# Patient Record
Sex: Female | Born: 1995 | Race: White | Hispanic: No | Marital: Single | Smoking: Former smoker
Health system: Southern US, Community
[De-identification: ages and names within clinical notes are randomized; demographics above are authoritative.]

## PROBLEM LIST (undated history)

## (undated) DIAGNOSIS — I421 Obstructive hypertrophic cardiomyopathy: Secondary | ICD-10-CM

## (undated) DIAGNOSIS — I219 Acute myocardial infarction, unspecified: Secondary | ICD-10-CM

## (undated) DIAGNOSIS — R6250 Unspecified lack of expected normal physiological development in childhood: Secondary | ICD-10-CM

## (undated) DIAGNOSIS — I4901 Ventricular fibrillation: Secondary | ICD-10-CM

## (undated) DIAGNOSIS — Z95810 Presence of automatic (implantable) cardiac defibrillator: Secondary | ICD-10-CM

## (undated) DIAGNOSIS — F909 Attention-deficit hyperactivity disorder, unspecified type: Secondary | ICD-10-CM

## (undated) DIAGNOSIS — F32A Depression, unspecified: Secondary | ICD-10-CM

## (undated) DIAGNOSIS — F329 Major depressive disorder, single episode, unspecified: Secondary | ICD-10-CM

## (undated) DIAGNOSIS — Z9289 Personal history of other medical treatment: Secondary | ICD-10-CM

## (undated) DIAGNOSIS — Q245 Malformation of coronary vessels: Secondary | ICD-10-CM

## (undated) HISTORY — DX: Ventricular fibrillation: I49.01

## (undated) HISTORY — DX: Major depressive disorder, single episode, unspecified: F32.9

## (undated) HISTORY — DX: Personal history of other medical treatment: Z92.89

## (undated) HISTORY — DX: Obstructive hypertrophic cardiomyopathy: I42.1

## (undated) HISTORY — DX: Malformation of coronary vessels: Q24.5

## (undated) HISTORY — DX: Depression, unspecified: F32.A

## (undated) HISTORY — DX: Unspecified lack of expected normal physiological development in childhood: R62.50

## (undated) HISTORY — DX: Attention-deficit hyperactivity disorder, unspecified type: F90.9

---
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
## 2014-04-25 ENCOUNTER — Encounter (HOSPITAL_COMMUNITY): Payer: Self-pay | Admitting: Emergency Medicine

## 2014-04-25 ENCOUNTER — Emergency Department (HOSPITAL_COMMUNITY): Payer: BC Managed Care – PPO

## 2014-04-25 ENCOUNTER — Emergency Department (HOSPITAL_COMMUNITY)
Admission: EM | Admit: 2014-04-25 | Discharge: 2014-04-25 | Disposition: A | Discharge status: Home / Self Care | Payer: BC Managed Care – PPO | Attending: Emergency Medicine | Admitting: Emergency Medicine

## 2014-04-25 DIAGNOSIS — I252 Old myocardial infarction: Secondary | ICD-10-CM | POA: Insufficient documentation

## 2014-04-25 DIAGNOSIS — R200 Anesthesia of skin: Secondary | ICD-10-CM | POA: Insufficient documentation

## 2014-04-25 DIAGNOSIS — Z79899 Other long term (current) drug therapy: Secondary | ICD-10-CM | POA: Insufficient documentation

## 2014-04-25 DIAGNOSIS — Z950 Presence of cardiac pacemaker: Secondary | ICD-10-CM | POA: Insufficient documentation

## 2014-04-25 DIAGNOSIS — Z720 Tobacco use: Secondary | ICD-10-CM | POA: Insufficient documentation

## 2014-04-25 LAB — CBC
HEMATOCRIT: 40.3 % (ref 36.0–46.0)
HEMOGLOBIN: 14.3 g/dL (ref 12.0–15.0)
MCH: 30.9 pg (ref 26.0–34.0)
MCHC: 35.5 g/dL (ref 30.0–36.0)
MCV: 87 fL (ref 78.0–100.0)
Platelets: 168 10*3/uL (ref 150–400)
RBC: 4.63 MIL/uL (ref 3.87–5.11)
RDW: 12.2 % (ref 11.5–15.5)
WBC: 6.5 10*3/uL (ref 4.0–10.5)

## 2014-04-25 LAB — BASIC METABOLIC PANEL
ANION GAP: 8 (ref 5–15)
BUN: 13 mg/dL (ref 6–23)
CHLORIDE: 107 meq/L (ref 96–112)
CO2: 23 mmol/L (ref 19–32)
Calcium: 9.2 mg/dL (ref 8.4–10.5)
Creatinine, Ser: 0.79 mg/dL (ref 0.50–1.10)
GFR calc non Af Amer: >90 mL/min (ref >90)
Glucose, Bld: 90 mg/dL (ref 70–99)
POTASSIUM: 4.3 mmol/L (ref 3.5–5.1)
Sodium: 138 mmol/L (ref 135–145)

## 2014-04-25 NOTE — ED Notes (Signed)
Patient transported to CT 

## 2014-04-25 NOTE — ED Provider Notes (Signed)
CSN: [id number]     Arrival date & time 04/25/14  1050 History   First MD Initiated Contact with Patient 04/25/14 1152     Chief Complaint  Patient presents with  . Leg Pain     (Consider location/radiation/quality/duration/timing/severity/associated sxs/prior Treatment) HPI Comments: Pt comes in today with complaint of left leg pain acute onset in nature this morning and some numbness from mid thigh down to the foot that lasted a couple of minutes. Pt states that she was able to walk during this time. Denies swelling, redness or warmth to that extremity. Denies any previous injury or history of back pain. Hasn't taken anything for the symptoms. Pt states that over the last month she has been having intermittent   The history is provided by the patient. No language interpreter was used.    Past Medical History  Diagnosis Date  . Myocardial infarction   . Cardiomyopathy    Past Surgical History  Procedure Laterality Date  . Pacemaker insertion     History reviewed. No pertinent family history. History  Substance Use Topics  . Smoking status: Current Some Day Smoker  . Smokeless tobacco: Not on file  . Alcohol Use: Yes     Comment: occasional   OB History    No data available     Review of Systems  All other systems reviewed and are negative.     Allergies  Review of patient's allergies indicates no known allergies.  Home Medications   Prior to Admission medications   Medication Sig Start Date End Date Taking? Authorizing Provider  ibuprofen (ADVIL,MOTRIN) 200 MG tablet Take 800 mg by mouth every 6 (six) hours as needed (pain).    Historical Provider, MD  metoprolol succinate (TOPROL-XL) 25 MG 24 hr tablet Take 25 mg by mouth daily.    Historical Provider, MD  oxyCODONE-acetaminophen (PERCOCET) 5-325 MG per tablet Take 1-2 tablets every 4-6 hours as needed for pain 07/31/12   Glade NurseBarbara Beck, PA-C  verapamil (CALAN) 80 MG tablet Take 40 mg by mouth every evening.     Historical Provider, MD   BP 113/76 mmHg  Pulse 98  Temp(Src) 97.5 F (36.4 C) (Oral)  Resp 18  SpO2 98% Physical Exam  Constitutional: She is oriented to person, place, and time. She appears well-developed and well-nourished.  Eyes: Conjunctivae and EOM are normal. Pupils are equal, round, and reactive to light.  Cardiovascular: Normal rate and regular rhythm.   Abdominal: Soft. Bowel sounds are normal. There is no tenderness.  Musculoskeletal: Normal range of motion.       Cervical back: Normal.       Thoracic back: Normal.       Lumbar back: Normal.  Neurological: She is alert and oriented to person, place, and time. Coordination normal.  Skin: Skin is warm and dry.  Psychiatric: She has a normal mood and affect.  Nursing note and vitals reviewed.   ED Course  Procedures (including critical care time) Labs Review Labs Reviewed  CBC  BASIC METABOLIC PANEL    Imaging Review Ct Head Wo Contrast  04/25/2014   CLINICAL DATA:  Lower extremity numbness and pain trauma patient has a pacemaker  EXAM: CT HEAD WITHOUT CONTRAST  TECHNIQUE: Contiguous axial images were obtained from the base of the skull through the vertex without intravenous contrast.  COMPARISON:  None.  FINDINGS: There is no evidence of mass effect, midline shift or extra-axial fluid collections. There is no evidence of a space-occupying lesion or intracranial  hemorrhage. There is no evidence of a cortical-based area of acute infarction.  The ventricles and sulci are appropriate for the patient's age. The basal cisterns are patent.  Visualized portions of the orbits are unremarkable. The visualized portions of the paranasal sinuses and mastoid air cells are unremarkable.  The osseous structures are unremarkable.  IMPRESSION: Normal CT of the brain without intravenous contrast.   Electronically Signed   By: Elige KoHetal  Patel   On: 04/25/2014 13:50     EKG Interpretation None      MDM   Final diagnoses:  Numbness     Pt is okay to follow up. Discussed with pt resources available for pcp. Pt is neurovascularly intact    Teressa LowerVrinda Malaak Stach, NP 04/25/14 1545  Purvis SheffieldForrest Harrison, MD 04/25/14 2037

## 2014-04-25 NOTE — Discharge Instructions (Signed)
Return as needed as discussed Paresthesia Paresthesia is a burning or prickling feeling. This feeling can happen in any part of the body. It often happens in the hands, arms, legs, or feet. HOME CARE  Avoid drinking alcohol.  Try massage or needle therapy (acupuncture) to help with your problems.  Keep all doctor visits as told. GET HELP RIGHT AWAY IF:   You feel weak.  You have trouble walking or moving.  You have problems speaking or seeing.  You feel confused.  You cannot control when you poop (bowel movement) or pee (urinate).  You lose feeling (numbness) after an injury.  You pass out (faint).  Your burning or prickling feeling gets worse when you walk.  You have pain, cramps, or feel dizzy.  You have a rash. MAKE SURE YOU:   Understand these instructions.  Will watch your condition.  Will get help right away if you are not doing well or get worse. Document Released: 04/01/2008 Document Revised: 07/12/2011 Document Reviewed: 01/08/2011 [facility] Patient Information 2015 [place], [place]. This information is not intended to replace advice given to you by your health care provider. Make sure you discuss any questions you have with your health care provider.  Emergency Department Resource Guide 1) Find a Doctor and Pay Out of Pocket Although you won't have to find out who is covered by your insurance plan, it is a good idea to ask around and get recommendations. You will then need to call the office and see if the doctor you have chosen will accept you as a new patient and what types of options they offer for patients who are self-pay. Some doctors offer discounts or will set up payment plans for their patients who do not have insurance, but you will need to ask so you aren't surprised when you get to your appointment.  2) Contact Your Local Health Department Not all health departments have doctors that can see patients for sick visits, but many do, so it is worth a call  to see if yours does. If you don't know where your local health department is, you can check in your phone book. The CDC also has a tool to help you locate your state's health department, and many state websites also have listings of all of their local health departments.  3) Find a Walk-in Clinic If your illness is not likely to be very severe or complicated, you may want to try a walk in clinic. These are popping up all over the country in pharmacies, drugstores, and shopping centers. They're usually staffed by nurse practitioners or physician assistants that have been trained to treat common illnesses and complaints. They're usually fairly quick and inexpensive. However, if you have serious medical issues or chronic medical problems, these are probably not your best option.  No Primary Care Doctor: - Call Health Connect at  [phone] - they can help you locate a primary care doctor that  accepts your insurance, provides certain services, etc. - Physician Referral Service- [phone]  Chronic Pain Problems: Organization         Address  Phone   Notes  [facility]  [phone] Patients need to be referred by their primary care doctor.   Medication Assistance: Organization         Address  Phone   Notes  [facility] Medication [facility] [address] [place] [place], [place] [address] [phone] --Must be a resident of [facility] -- Must have NO  insurance coverage whatsoever (no Medicaid/ Medicare, etc.) -- The pt. MUST have a primary care doctor that directs their care regularly and follows them in the community   MedAssist  [phone]) [phone]   [employer]  [phone]    Agencies that provide inexpensive medical care: Organization         Address  Phone   Notes  [facility]  [phone](336) [phone]   [facility]    [phone]) [phone]   [facility] [address] [place], [place] [address] [phone])  [phone]   [facility] [place] [address], [place] [phone]) [phone]   Planned Parenthood    [phone]   [facility]    [phone]) [phone]   [facility]  [address], [place] Phone:  [phone], Fax:  [phone]) [phone] Hours of Operation:  9 am - 6 pm, M-F.  Also accepts Medicaid/Medicare and self-pay.  [facility]  [address], [place] Phone: [phone]) [phone], Fax: [phone]) [phone]. Hours of Operation:  8:30 am - 5:30 pm, M-F.  Also accepts Medicaid and self-pay.  [facility] [place] [address], [place] Phone: [phone]) [phone]   [facility] [address], [place], [place] [phone], Ext. [phone] Mondays & Thursdays: 7-9 AM.  First 15 patients are seen on a first come, first serve basis.    Medicaid-accepting [facility] Providers:  Organization         Address  Phone   Notes  [facility] [address], [place] [phone]) [phone] Also accepts self-pay patients.  [facility] [address], [place]  [phone]) [phone]   [facility] [address], [place] [phone]) [phone]   [facility] [address] [place] [address], [place] [phone]) [phone]   Renaye RakersVeita Bland [address], [place]   [phone](336) [phone] Only accepts [place] Access [place] patients after they have their name applied to their card.   Self-Pay (no insurance) in [facility]:  Organization         Address  Phone   Notes  Sickle Cell Patients, [facility] [address], [place] [phone]) [phone]   [facility] [address], [place] [phone]) [phone]   [facility] [place]  [address], [place] [phone]) [phone]   [facility]/Dr. Osei-Bonsu  [address] [place] [address], [place] or [phone], [place] [phone] Phone number for both [place] and [place] locations is the same.  [facility] [address], [place] [phone]) [phone]   [facility] [address] [place] [address], [place] or [address] [phone]) [phone]) [phone]   [facility] [address], [place] [phone](336) [phone], phone; [phone](336) [phone], fax Sees patients 1st and 3rd Saturday of every month.  Must not qualify for public or private insurance (i.e. Medicaid, Medicare,  Health Choice, Veterans' Benefits)  Household income should be no more than 200% of the poverty level The clinic cannot treat you if you are pregnant or think you are pregnant  Sexually transmitted diseases are not treated at the clinic.    Dental Care: Organization         Address  Phone  Notes  [facility]  Department of Public Health [facility] [address], [place] [phone]) [phone] Accepts children up to age 19 who are enrolled in [place]icaid or [place] Health Choice; pregnant women with a Medicaid card; and children who have applied for Medicaid or [place] Health Choice, but were declined, whose parents can pay a reduced fee at time of service.  [facility] Department of [facility] [place]  [address], [place] [phone]) [phone] Accepts children up to age 19 who are enrolled in [place] or [place] Health Choice; pregnant women with a Medicaid card; and children who have applied for Medicaid or  Health Choice, but were declined, whose parents can pay a reduced fee at time of service.  [facility]  [address], [place] [phone]) [phone] Patients are seen by appointment only. Walk-ins are not accepted. [facility] will see patients 19 years of age and older. Monday - Tuesday (8am-5pm) Most Wednesdays (8:30-5pm) $30 per visit, cash only  [facility]  [address], [place] [phone]) [phone] Patients are  seen by appointment only. Walk-ins are not accepted. [facility] will see patients 818 years of age and older. One Wednesday Evening (Monthly: Volunteer Based).  $30 per visit, cash only  [employer] [facility]  [phone]) [phone] for adults; Children under age 114, call Graduate Pediatric Dentistry at [phone]) [phone]. Children aged 564-14, please call [phone]) [phone] to request a pediatric application.  Dental services are provided in all areas of dental care including fillings, crowns and bridges, complete and partial dentures, implants, gum treatment, root canals, and extractions. Preventive care is also provided. Treatment is provided to both adults and children. Patients are selected via a lottery and there is often a waiting list.   [facility] [address], [place]  [phone]) [phone] [email or web address]   [facility] [address], [place], [place] [phone], Ext. [phone] Second and Fourth Thursday of each month, opens at 6:30 AM; Clinic ends at 9 AM.  Patients are seen on a first-come first-served basis, and a limited number are seen during each clinic.   [facility]  [address], [place], [place] [phone]) [phone]   Eligibility Requirements You must have lived in [place], [place], or [place] counties for at least the last three months.   You cannot be eligible for state or federal sponsored National Cityhealthcare insurance, including CIGNAVeterans Administration, [place]icaid, or [employer]Medicare.   You generally cannot be eligible for healthcare insurance through your employer.    How to apply: Eligibility screenings are held every Tuesday and Wednesday afternoon from 1:00 pm until 4:00 pm. You do not need an appointment for the interview!  [facility] [address], [place], [place] [phone]   [facility]  [phone]   [facility]  [phone]   [facility]  [phone]     Behavioral Health Resources in the Community: Intensive Outpatient Programs Organization         Address  Phone  Notes  [facility] [address], [place], [place] [phone]   [facility] [address], [place], [place] [phone]   [facility] [address], [place], [place]  [phone]   [facility] [address],  [place], [place] [phone] or [phone]   Substance Abuse Resources Organization  Address  Phone  Notes  [facility]  [phone]   [facility]  [phone]   [facility]  [phone]   Floydene Flock  [phone]   Residential & Outpatient Substance Abuse Program  [phone]   Psychological Services Organization         Address  Phone  Notes  [facility]  [phone]   [facility]  [phone]   [facility] [address], [place] [phone] or [phone]    Mobile Crisis Teams Organization         Address  Phone  Notes  [facility], Mobile Crisis Care Unit  [phone]   [facility]  [address] [place], [place] [phone]   Doristine Locks [address] [place] [phone]    Self-Help/Support Groups Organization         Address  Phone             Notes  [facility] [place] - variety of support groups  336- I7437963 Call for more information  Narcotics Anonymous (NA), Caring Services [address], [employer] [place]  2 meetings at this location   Statistician         Address  Phone  Notes  [facility] [address],    [place]  [phone]   [facility]  [address], [place] [address], [place], [place] [phone]   [facility] [address] [place], [place] [phone] Admissions: 8am-3pm M-F  Incentives  [facility] Treatment Center [address]    [place], [place] [phone]   [facility] [address] [place], [place], [place] [phone]   [facility] [address],  [place], [place] [phone]   [facility] - Intensive Outpatient [address], [place], [place] [phone]   [facility] [address] [place].,  [place], [place] [phone] or [phone]   [facility] ([facility]) [address] [place], [place] [phone] Accepts Medicaid  [facility] [place] [address],  [place] [phone] Substance Abuse/Addiction Treatment   [facility] Organization         Address  Phone  Notes  [facility]  [phone]   Angie Fava, PhD [address] [place], [place]   [phone] or [phone]   [facility] Behavioral   [address] [place], [place] [phone]   [facility] [address], [place], [place] [phone] Insurance/Medicaid/sponsorship through [facility] and Families [address]                                    [place], [place] [phone] Therapy/tele-psych/case  [facility] [address], [place] [phone]    Dr. Lolly Mustache  [phone]   [facility]  [employer] [facility] Dept. 1) [address], [place] 2) [address] [place] 3)  [address], [place] [phone]  [phone]   [facility] Child Abuse Hotline (903)653-2658 or [phone] (After Hours)

## 2014-04-25 NOTE — ED Notes (Signed)
Pt returned from CT °

## 2014-04-25 NOTE — ED Notes (Signed)
Medtronic would not interrogate pacemaker. Pt thinks it may be something different now.

## 2014-04-25 NOTE — ED Notes (Signed)
Pt c/o left leg pain with some numbness at times; pt sts some numbness in arm at times; pt denies at present; pt sts hx of pacemaker

## 2016-08-31 DIAGNOSIS — Z9289 Personal history of other medical treatment: Secondary | ICD-10-CM

## 2016-08-31 HISTORY — DX: Personal history of other medical treatment: Z92.89

## 2016-09-10 ENCOUNTER — Encounter (HOSPITAL_COMMUNITY): Payer: Self-pay | Admitting: Emergency Medicine

## 2016-09-10 ENCOUNTER — Emergency Department (HOSPITAL_COMMUNITY): Payer: Self-pay

## 2016-09-10 ENCOUNTER — Emergency Department (HOSPITAL_COMMUNITY)
Admission: EM | Admit: 2016-09-10 | Discharge: 2016-09-10 | Disposition: A | Discharge status: Home / Self Care | Payer: Self-pay | Attending: Emergency Medicine | Admitting: Emergency Medicine

## 2016-09-10 DIAGNOSIS — F17200 Nicotine dependence, unspecified, uncomplicated: Secondary | ICD-10-CM | POA: Insufficient documentation

## 2016-09-10 DIAGNOSIS — I252 Old myocardial infarction: Secondary | ICD-10-CM | POA: Insufficient documentation

## 2016-09-10 DIAGNOSIS — I422 Other hypertrophic cardiomyopathy: Secondary | ICD-10-CM | POA: Insufficient documentation

## 2016-09-10 DIAGNOSIS — Z4502 Encounter for adjustment and management of automatic implantable cardiac defibrillator: Secondary | ICD-10-CM | POA: Insufficient documentation

## 2016-09-10 DIAGNOSIS — N9489 Other specified conditions associated with female genital organs and menstrual cycle: Secondary | ICD-10-CM | POA: Insufficient documentation

## 2016-09-10 DIAGNOSIS — R002 Palpitations: Secondary | ICD-10-CM | POA: Insufficient documentation

## 2016-09-10 DIAGNOSIS — Z95810 Presence of automatic (implantable) cardiac defibrillator: Secondary | ICD-10-CM

## 2016-09-10 HISTORY — DX: Presence of automatic (implantable) cardiac defibrillator: Z95.810

## 2016-09-10 LAB — CBC WITH DIFFERENTIAL/PLATELET
BASOS PCT: 1 %
Basophils Absolute: 0 10*3/uL (ref 0.0–0.1)
EOS ABS: 0.1 10*3/uL (ref 0.0–0.7)
Eosinophils Relative: 2 %
HCT: 43.2 % (ref 36.0–46.0)
HEMOGLOBIN: 15.1 g/dL — AB (ref 12.0–15.0)
Lymphocytes Relative: 44 %
Lymphs Abs: 2.4 10*3/uL (ref 0.7–4.0)
MCH: 30.5 pg (ref 26.0–34.0)
MCHC: 35 g/dL (ref 30.0–36.0)
MCV: 87.3 fL (ref 78.0–100.0)
MONO ABS: 0.5 10*3/uL (ref 0.1–1.0)
MONOS PCT: 9 %
NEUTROS PCT: 44 %
Neutro Abs: 2.3 10*3/uL (ref 1.7–7.7)
Platelets: 167 10*3/uL (ref 150–400)
RBC: 4.95 MIL/uL (ref 3.87–5.11)
RDW: 12.3 % (ref 11.5–15.5)
WBC: 5.3 10*3/uL (ref 4.0–10.5)

## 2016-09-10 LAB — BASIC METABOLIC PANEL
Anion gap: 8 (ref 5–15)
BUN: 12 mg/dL (ref 6–20)
CHLORIDE: 106 mmol/L (ref 101–111)
CO2: 21 mmol/L — AB (ref 22–32)
CREATININE: 0.72 mg/dL (ref 0.44–1.00)
Calcium: 9.3 mg/dL (ref 8.9–10.3)
GFR calc non Af Amer: >60 mL/min (ref >60)
Glucose, Bld: 92 mg/dL (ref 65–99)
Potassium: 4 mmol/L (ref 3.5–5.1)
SODIUM: 135 mmol/L (ref 135–145)

## 2016-09-10 LAB — HCG, QUANTITATIVE, PREGNANCY

## 2016-09-10 LAB — TROPONIN I: Troponin I: 0.16 ng/mL (ref <0.03)

## 2016-09-10 NOTE — ED Triage Notes (Addendum)
Pt reports she has had a medtronic ICD  placed x7 years, has not seen cardiologist in 4 years due to not having insurance. Pt states she was concerned it may be time to have her ICD checked. Denies chest pain but states sometimes she feels like her heart is racing. Pt a/ox4, resp e/u, nad.

## 2016-09-10 NOTE — ED Provider Notes (Signed)
[facility]ER[facility]ENCY DEPT Provider Note   CSN: [id number] Arrival date & time: 09/10/16  1037     History   Chief Complaint Chief Complaint  Patient presents with  . ICD check    HPI Sara Lopez is a 21 y.o. female.  HPI  Presents with concern for desire for ICD check. Was placed 7 years ago for ICD. 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 Reports needs note to say she is ok to go to work.  Has not had heart restrictions before.  Reports sometimes with stress feels lightheaded and palpitations.  Sometimes gets pain in side, caused by stress. Left lower chest and upper abdomen. No significant pain today. Lately felt very stressed out, yesterday began to feel very lightheaded with stress and had to sit down to relax.  No nausea or vomiting.  No syncope.  Implanon in place, no hx of long trips, DVT or PE.   Past Medical History:  Diagnosis Date  . Cardiomyopathy (HCC)   . ICD (implantable cardioverter-defibrillator) in place   . Myocardial infarction (HCC)     There are no active problems to display for this patient.   Past Surgical History:  Procedure Laterality Date  . PACEMAKER INSERTION      OB History    No data available       Home Medications    Prior to Admission medications   Medication Sig Start Date End Date Taking? Authorizing Provider  oxyCODONE-acetaminophen (PERCOCET) 5-325 MG per tablet Take 1-2 tablets every 4-6 hours as needed for pain Patient not taking: Reported on 04/25/2014 07/31/12   Lowell Bouton, PA-C    Family History No family history on file.  Social History Social History  Substance Use Topics  . Smoking status: Current Some Day Smoker  . Smokeless tobacco: Not on file  . Alcohol use Yes     Comment: occasional     Allergies   Patient has no known allergies.   Review of Systems Review of Systems  Constitutional: Negative for fever.  HENT: Negative for sore throat.   Eyes: Negative for  visual disturbance.  Respiratory: Negative for cough and shortness of breath.   Cardiovascular: Positive for chest pain and palpitations.  Gastrointestinal: Negative for abdominal pain, nausea and vomiting.  Genitourinary: Negative for difficulty urinating.  Musculoskeletal: Negative for back pain and neck pain.  Skin: Negative for rash.  Neurological: Positive for light-headedness. Negative for syncope and headaches.     Physical Exam Updated Vital Signs BP 107/70   Pulse 72   Temp 98.4 F (36.9 C) (Oral)   Resp 20   LMP 08/28/2016 (Approximate)   SpO2 96%   Physical Exam  Constitutional: She is oriented to person, place, and time. She appears well-developed and well-nourished. No distress.  HENT:  Head: Normocephalic and atraumatic.  Eyes: Conjunctivae and EOM are normal.  Neck: Normal range of motion.  Cardiovascular: Normal rate, regular rhythm, normal heart sounds and intact distal pulses.  Exam reveals no gallop and no friction rub.   No murmur heard. Pulmonary/Chest: Effort normal and breath sounds normal. No respiratory distress. She has no wheezes. She has no rales.  Abdominal: Soft. She exhibits no distension. There is no tenderness. There is no guarding.  Musculoskeletal: She exhibits no edema or tenderness.  Neurological: She is alert and oriented to person, place, and time.  Skin: Skin is warm and dry. No rash noted. She is not diaphoretic. No erythema.  Nursing note and  vitals reviewed.    ED Treatments / Results  Labs (all labs ordered are listed, but only abnormal results are displayed) Labs Reviewed  CBC WITH DIFFERENTIAL/PLATELET - Abnormal; Notable for the following:       Result Value   Hemoglobin 15.1 (*)    All other components within normal limits  BASIC METABOLIC PANEL - Abnormal; Notable for the following:    CO2 21 (*)    All other components within normal limits  TROPONIN I - Abnormal; Notable for the following:    Troponin I 0.16 (*)     All other components within normal limits  HCG, QUANTITATIVE, PREGNANCY    EKG  EKG Interpretation  Date/Time:  Friday Sep 10 2016 10:51:49 EDT Ventricular Rate:  84 PR Interval:  154 QRS Duration: 92 QT Interval:  358 QTC Calculation: 423 R Axis:   -59 Text Interpretation:  Normal sinus rhythm Biatrial enlargement Left axis deviation Inferior infarct , age undetermined Possible Anterior infarct , age undetermined Abnormal ECG No previous ECGs available Confirmed by [facility] Center[facility]LOS[facility]AN MD, Turquoise Esch ([id number]) on 09/10/2016 11:25:24 AM       Radiology Dg Chest 2 View  Result Date: 09/10/2016 CLINICAL DATA:  Shortness of breath. EXAM: CHEST  2 VIEW COMPARISON:  None. FINDINGS: The heart size and mediastinal contours are within normal limits. Both lungs are clear. No pneumothorax or pleural effusion is noted. Single lead left-sided pacemaker is in grossly good position. The visualized skeletal structures are unremarkable. IMPRESSION: No active cardiopulmonary disease. Electronically Signed   By: Lupita RaiderJames  Green Jr, M.D.   On: 09/10/2016 13:32    Procedures Procedures (including critical care time)  Medications Ordered in ED Medications - No data to display   Initial Impression / Assessment and Plan / ED Course  I have reviewed the triage vital signs and the nursing notes.  Pertinent labs & imaging results that were available during my care of the patient were reviewed by me and considered in my medical decision making (see chart for details).    21 year old female with a history of hypertrophic cardiomyopathy, with ICD placement 7 years ago, [facility] with no follow up in the last 4 years, presents with desire for note to begin a new job, with symptoms of chronic intermittent palpitations, chest pain and dyspnea. EKG does not show significant acute abnormalities. Chest x-ray shows no sign of pneumonia, pulmonary edema, or abnormalities. CBC shows no anemia. BMP  is within normal limits. Troponin evaluated and positive at 0.16. Discussed with Dr. Eden EmmsNishan of Cardiology, who feels that her elevated troponin is likely secondary to her septal hypertrophy, and this clinical scenario, does not recommend admission. Reports we could check a CK her CK-MB, however I do not feel this will clinically change plan. Have low suspicion for ACS by history, and on review of records, patient has intermittently elevated troponins in the past.  Interrogated her ICD, which showed no evidence of tachyarrhythmias over the last 4 years. She is hemodynamically stable in the emergency department. Discussed that I'm unable to sign her work form given the types of symptoms she is describing and without an ECHO. Discussed with patient that she needs cardiology follow-up. Discussed with Dr. Eden EmmsNishan who reports he will put in a note to the office to call her, and she may call the office to schedule as well. She is concerned regarding costs, case manager was consulted and set up primary care physician follow-up, which patient can obtain an orange  card for specialty follow-up. Patient discharged in stable condition with understanding of reasons to return.    Final Clinical Impressions(s) / ED Diagnoses   Final diagnoses:  ICD (implantable cardioverter-defibrillator) in place  Palpitations  Hypertrophic cardiomyopathy [facility])    New Prescriptions Discharge Medication List as of 09/10/2016  3:21 PM       Alvira Monday, MD 09/10/16 [phone]

## 2016-09-10 NOTE — Discharge Planning (Signed)
Spoke to pt at bedside regarding appointment scheduled for 5/30.  EDCM instructed pt to call each day to check for cancellations for a soon appointment date.  Pt understands as she repeated instructions.

## 2016-09-16 ENCOUNTER — Encounter (INDEPENDENT_AMBULATORY_CARE_PROVIDER_SITE_OTHER): Payer: Self-pay | Admitting: Physician Assistant

## 2016-09-16 ENCOUNTER — Ambulatory Visit (INDEPENDENT_AMBULATORY_CARE_PROVIDER_SITE_OTHER): Payer: Self-pay | Admitting: Physician Assistant

## 2016-09-16 VITALS — BP 99/60 | HR 69 | Temp 97.7°F | Ht 64.0 in | Wt 181.2 lb

## 2016-09-16 DIAGNOSIS — I252 Old myocardial infarction: Secondary | ICD-10-CM | POA: Insufficient documentation

## 2016-09-16 DIAGNOSIS — Z95810 Presence of automatic (implantable) cardiac defibrillator: Secondary | ICD-10-CM

## 2016-09-16 DIAGNOSIS — I429 Cardiomyopathy, unspecified: Secondary | ICD-10-CM

## 2016-09-16 HISTORY — DX: Old myocardial infarction: I25.2

## 2016-09-16 NOTE — Progress Notes (Signed)
Subjective:  Patient ID: Sara Lopez, female    DOB: 1995/06/05  Age: 21 y.o. MRN: [id number]  CC: Needs letter for work   HPI Sara Lopez is a 21 y.o. female with a PMH of Cardiomyopathy, ICD, and MI presents to have medical clearance paperwork filled out. She went to the ED on 09/10/16.  Complained of chronic intermittent palpitations, chest pain, and dyspnea. EKG without significant abnormalities, CXR normal, CBC normal, BMP normal. She had elevated troponins which was felt by Dr Eden Emms of Cardiology to be secondary to her septal hypertrophy. ED interrogated her ICD and showed no evidence of tachyarrhythmias over the last 4 years. Has not been to a cardiologist in the last 4 years. Patient wanted a note from ED that stated she may be cleared to work at a new job but did not have it filled out due to need for further evaluation and her symptomatology. Patient states that she is extremely stressed and has financial issues. Blames her financial issues for her symptoms. Says that if paperwork is filled out, and she can go to her new job, she will not have her current symptoms.         Outpatient Medications Prior to Visit  Medication Sig Dispense Refill  . oxyCODONE-acetaminophen (PERCOCET) 5-325 MG per tablet Take 1-2 tablets every 4-6 hours as needed for pain (Patient not taking: Reported on 04/25/2014) 20 tablet 0   No facility-administered medications prior to visit.      ROS Review of Systems  Constitutional: Negative for chills, fever and malaise/fatigue.  Eyes: Negative for blurred vision.  Respiratory: Positive for shortness of breath.   Cardiovascular: Positive for chest pain and palpitations.  Gastrointestinal: Negative for abdominal pain and nausea.  Genitourinary: Negative for dysuria and hematuria.  Musculoskeletal: Negative for joint pain and myalgias.  Skin: Negative for rash.  Neurological: Negative for tingling and headaches.  Psychiatric/Behavioral: Negative  for depression. The patient is not nervous/anxious.     Objective:  BP 99/60 (BP Location: Left Arm, Patient Position: Sitting, Cuff Size: Large)   Pulse 69   Temp 97.7 F (36.5 C) (Oral)   Ht 5\' 4"  (1.626 m)   Wt 181 lb 3.2 oz (82.2 kg)   LMP 08/28/2016 (Approximate)   SpO2 96%   BMI 31.10 kg/m   BP/Weight 09/16/2016 09/10/2016 04/25/2014  Systolic BP 99 107 99  Diastolic BP 60 70 55  Wt. (Lbs) 181.2 - -  BMI 31.1 - -      Physical Exam  Constitutional: She is oriented to person, place, and time.  Well developed, well nourished, NAD  HENT:  Head: Normocephalic and atraumatic.  Eyes: No scleral icterus.  Neurological: She is alert and oriented to person, place, and time.  Skin: Skin is warm and dry. No rash noted. No erythema. No pallor.  Psychiatric: She has a normal mood and affect. Thought content normal.   irritable, upset, tearful  Vitals reviewed.    Assessment & Plan:   1. Cardiomyopathy, unspecified type (HCC) - Pt extremely upset that I did not sign a medical clearance for her. I could not get a chance to properly conduct a physical exam. However, I did obtain an appointment for her with Cardiology in 5 days.  I gave her contact information and address. Please see instructions I wrote on AVS.  2. ICD (implantable cardioverter-defibrillator) in place - No events found on interrogation at ED.   Follow-up: Return if symptoms worsen or fail to improve.   Antoine Vandermeulen  Mariana Arnavid Sara Heacox PA

## 2016-09-16 NOTE — Patient Instructions (Addendum)
I have called Dr. Fabio BeringNishan's office and confirmed appointment for you. Tereso NewcomerScott Weaver PA at the heart clinic on Tuesday at 11:15 am. There number is [phone]. You will be billed, they will also have you fill out financial assistance paperwork.       Hypertrophic Cardiomyopathy Hypertrophic cardiomyopathy (HCM) is a heart condition in which part of the heart muscle gets too thick. The condition can cause a dangerous and abnormal heart rhythm. It can also weaken the heart over time. The heart is divided into four chambers. The thickening usually affects the pumping chamber on the lower left (left ventricle). HCM may also affect the valve that lets blood flow out of the left ventricle (mitral valve). Symptoms often begin at about age 22. What are the causes? This condition is usually caused by abnormal genes that control heart muscle growth. These abnormal genes are passed down through families (are inherited). What increases the risk? This condition is more likely to develop in people with a family history of HCM. What are the signs or symptoms? Symptoms of this condition include:  Shortness of breath, especially after exercising or lying down.  Chest pain.  Dizziness.  Fatigue.  Irregular or fast heart rate.  Fainting, especially after physical activity. How is this diagnosed? This condition is diagnosed based on the results of a physical exam that involves checking for an abnormal heart sound (heart murmur). Tests may be done, including:  An electrocardiogram (ECG). This test records your heart's electrical activity.  An echocardiogram. This test can show whether your left ventricle is enlarged and whether it fills slowly.  A Doppler test. This test shows irregular blood flow and pressure differences inside the heart.  A chest X-ray. This test can show whether your heart is enlarged.  An MRI. How is this treated? Treatment for HCM depends on how severe your symptoms are. There  are several options for treatment, including.  Medicine. Medicines can be given to:  Reduce the workload of your heart.  Lower your blood pressure.  Thin your blood and prevent clots.  A device. Devices that can be used to treat this condition include:  A pacemaker. This device helps to control your heartbeat.  A defibrillator. This device restores a normal heart rhythm.  Surgery. This may include a procedure to:  Inject alcohol into the small blood vessels that supply your heart muscle (alcohol septal ablation). This is done during a procedure called cardiac catheterization. The goal is to cause the muscle to become thinner.  Remove part of the wall that divides the right and left sides of the heart (septum).  Replace the mitral valve. Follow these instructions at home:  Avoid strenuous exercise and activities, like heavy lifting or shoveling snow.  Make sure the members of your household know how to do CPR in case of an emergency.  Take over-the-counter and prescription medicines only as told by your health care provider.  Follow a healthy diet and maintain a healthy weight. If you need help with losing weight, ask your health care provider.  Limit alcohol intake to no more than 1 drink per day for nonpregnant women and 2 drinks per day for men. One drink equals 12 oz of beer, 5 oz of wine, or 1 oz of hard liquor.  Do not use any products that contain nicotine or tobacco, such as cigarettes and e-cigarettes. If you need help quitting, ask your health care provider.  Keep all follow-up visits as directed by your health care provider.  This is important. Contact a health care provider if:  You have new symptoms.  Your symptoms get worse. Get help right away if:  You have chest pain or shortness of breath, especially during or after sports.  You feel faint or you pass out.  You have trouble breathing even at rest.  Your feet or ankles swell.  Your heartbeat seems  irregular or seems faster than normal (palpitations).  Your heartbeat seems abnormal. This information is not intended to replace advice given to you by your health care provider. Make sure you discuss any questions you have with your health care provider. Document Released: 03/25/2004 Document Revised: 03/12/2016 Document Reviewed: 03/12/2016 Elsevier Interactive Patient Education  2017 [employer].

## 2016-09-21 ENCOUNTER — Telehealth: Payer: Self-pay | Admitting: Physician Assistant

## 2016-09-21 ENCOUNTER — Ambulatory Visit (INDEPENDENT_AMBULATORY_CARE_PROVIDER_SITE_OTHER): Payer: Self-pay | Admitting: Physician Assistant

## 2016-09-21 ENCOUNTER — Encounter: Payer: Self-pay | Admitting: Physician Assistant

## 2016-09-21 ENCOUNTER — Ambulatory Visit (INDEPENDENT_AMBULATORY_CARE_PROVIDER_SITE_OTHER): Payer: Self-pay | Admitting: *Deleted

## 2016-09-21 VITALS — BP 116/60 | HR 59 | Ht 66.0 in | Wt 178.0 lb

## 2016-09-21 DIAGNOSIS — Q245 Malformation of coronary vessels: Secondary | ICD-10-CM | POA: Insufficient documentation

## 2016-09-21 DIAGNOSIS — I421 Obstructive hypertrophic cardiomyopathy: Secondary | ICD-10-CM

## 2016-09-21 DIAGNOSIS — Z95810 Presence of automatic (implantable) cardiac defibrillator: Secondary | ICD-10-CM

## 2016-09-21 DIAGNOSIS — Z8674 Personal history of sudden cardiac arrest: Secondary | ICD-10-CM | POA: Insufficient documentation

## 2016-09-21 DIAGNOSIS — I422 Other hypertrophic cardiomyopathy: Secondary | ICD-10-CM | POA: Insufficient documentation

## 2016-09-21 HISTORY — DX: Personal history of sudden cardiac arrest: Z86.74

## 2016-09-21 LAB — CUP PACEART INCLINIC DEVICE CHECK
Battery Voltage: 2.82 V
Brady Statistic AP VP Percent: 0 %
Brady Statistic AP VS Percent: 0 %
Brady Statistic AS VP Percent: 0 %
Brady Statistic AS VS Percent: 100 %
Brady Statistic RA Percent Paced: 0 %
Brady Statistic RV Percent Paced: 0.41 %
Date Time Interrogation Session: 20180522131451
HighPow Impedance: 48 Ohm
HighPow Impedance: 817 Ohm
Implantable Lead Implant Date: 20111229
Implantable Lead Location: 753860
Implantable Lead Model: 7122
Implantable Pulse Generator Implant Date: 20111229
Lead Channel Impedance Value: 4047 Ohm
Lead Channel Impedance Value: 931 Ohm
Lead Channel Pacing Threshold Amplitude: 1.75 V
Lead Channel Pacing Threshold Pulse Width: 1 ms
Lead Channel Sensing Intrinsic Amplitude: 19.875 mV
Lead Channel Sensing Intrinsic Amplitude: 20 mV
Lead Channel Setting Pacing Amplitude: 3.5 V
Lead Channel Setting Pacing Pulse Width: 1 ms
Lead Channel Setting Sensing Sensitivity: 0.6 mV

## 2016-09-21 MED ORDER — METOPROLOL TARTRATE 25 MG PO TABS: 12.5000 mg | ORAL_TABLET | Freq: Two times a day (BID) | ORAL | 11 refills | Status: DC

## 2016-09-21 NOTE — Telephone Encounter (Signed)
Note is written. Patient may pick up. I did fax to her employer.  Call if they did not receive it. Tereso NewcomerScott Knox Holdman, PA-C    09/21/2016 5:25 PM

## 2016-09-21 NOTE — Telephone Encounter (Signed)
New Message  Pt needs call back before 5pm

## 2016-09-21 NOTE — Patient Instructions (Signed)
Medication Instructions:  1. START METOPROLOL TARTRATE 25 MG TABLET WITH THE DIRECTIONS ON BOTTLE TO READ: TAKE 1/2 TABLET TWICE DAILY  Labwork: NONE ORDERED  Testing/Procedures: Your physician has requested that you have an echocardiogram. Echocardiography is a painless test that uses sound waves to create images of your heart. It provides your doctor with information about the size and shape of your heart and how well your heart's chambers and valves are working. This procedure takes approximately one hour. There are no restrictions for this procedure.    Follow-Up: DR. Graciela HusbandsKLEIN IN 3 MONTHS   Any Other Special Instructions Will Be Listed Below (If Applicable).     If you need a refill on your cardiac medications before your next appointment, please call your pharmacy.

## 2016-09-21 NOTE — Progress Notes (Signed)
ICD check in clinic. Normal device function. Threshold and sensing consistent with previous device measurements. Impedance trends stable over time. No evidence of any ventricular arrhythmias. Histogram distribution appropriate for patient and level of activity. RV pulse width increased to 1.210ms, sensitivity decreased to 0.4460mV d/t TWOS seen w/Vp. Device programmed at appropriate safety margins. Device programmed to optimize intrinsic conduction. Batt voltage 2.82V (ERI 2.63V). Pt will follow up with WC in 3 months to establish. Patient education completed including shock plan. Alert tones demonstrated for patient.

## 2016-09-21 NOTE — Telephone Encounter (Signed)
I tried to call pt back though no answer. Work note has been placed at the front desk for pt pick up. I will try to reach pt again in the morning, or if she calls back she can be informed her letter is at the front desk for pick up.

## 2016-09-21 NOTE — Progress Notes (Signed)
Cardiology Office Note:    Date:  09/21/2016   ID:  Sara Lopez, DOB 1995/08/31, MRN [id number]  PCP:  Loletta Specter, PA-C  Cardiologist:  New - will see if Dr. Sherryl Manges will accept   Referring MD: Loletta Specter, PA-C   Chief Complaint  Patient presents with  . Cardiomyopathy    History of Present Illness:    Sara Lopez is a 21 y.o. 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  She tells me that she was using drugs heavily at the time she suffered cardiac arrest in 2011.  She has since stopped using drugs.  She was previously followed by Orlie Dakin, MD at [facility].  She has not been seen by Cardiology in several years.  She was admitted to [facility] in 2014 with chest pain and an elevated Troponin at an outside hospital.  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 Nuclear stress test was neg for ischemia.  She was tx with beta-blocker.  Review of prior notes indicates she was also on Verapamil.     The patient was seen in the emergency room 09/10/16.  The notes indicate she presented with chest pain, shortness of breath and intermittent palpitations. It was noted that she had a minimally elevated troponin (0.16). Notes indicate that the case was discussed with Dr. Eden Emms who felt her elevated Troponin was related to septal hypertrophy. No further inpatient workup was recommended. Notes indicate that her ICD was interrogated. No tachyarrhythmias were apparently evident in the last 4 years.  I do not have access to the device interrogation.  [name] presents to establish with [facility] for Cardiology follow up.  She is here alone. She is trying to get a job at [employer].  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  She needs a note to clear her to begin work.  The ED provider and her PCP preferred that she be cleared by Cardiology  given her cardiac hx.  She has a long hx of chest pain and shortness of breath with activity.  This has been stable for years without change.  She denies syncope.  She denies any ICD shocks.  She denies orthopnea, PND, edema.    Prior CV studies:   The following studies were reviewed today:  LHC 2/14 (at [facility]) LAD mid myocardial bridge (patent in diastole and near complete occlusion in systole) RCA, LCx normal  Myoview 2/14 (at [facility]) Inferior attenuation, no ischemia, normal EF  Echo 1/14 (at [facility]) Hypertrophic CM, mild dynamic LVOT - peak 27 mmHg, chordal SAM with LVOT obstruction, mild MR, dilate LCA, abnormal LV diastolic function, hyperdynamic LV systolic function  Past Medical History:  Diagnosis Date  . ADHD   . Coronary-myocardial bridge    LHC at [facility] 2/14: LAD mid myocardial bridge (patent in diastole and near complete occlusion distally) // Myoview 2/14: Inferior attenuation, no ischemia, normal EF  . Depression   . Development delay   . HOCM (hypertrophic obstructive cardiomyopathy) (HCC)    Echo 1/14: Hypertrophic CM, mild dynamic LVOT - peak 27 mmHg, chordal SAM with LVOT obstruction, mild MR, dilate LCA, abnormal LV diastolic function, hyperdynamic LV systolic function  . ICD (implantable cardioverter-defibrillator) in place    Medtronic model D334DRM (Protect), serial number [id number], implanted 04/30/2010   . Myocardial infarction (HCC)    hx of elevated Tn levels // LHC  in 2014 at [facility] with myocardial bridging  . VF (ventricular fibrillation) (HCC)    admitted in 2011 with VF arrest >> dx with HOCM // s/p ICD    Past Surgical History:  Procedure Laterality Date  . ICD IMPLANT  2011    Current Medications: No outpatient prescriptions have been marked as taking for the 09/21/16 encounter (Office Visit) with Tereso Newcomer T, PA-C.     Allergies:   Patient has no known allergies.   Social History   Social History  . Marital status: Single    Spouse name:  N/A  . Number of children: N/A  . Years of education: N/A   Occupational History  . cashier    Social History Main Topics  . Smoking status: Current Some Day Smoker  . Smokeless tobacco: Never Used  . Alcohol use 0.6 oz/week    1 Glasses of wine per week     Comment: occasional  . Drug use: No     Comment: no drugs since 2011  . Sexual activity: Not Asked   Other Topics Concern  . 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 She was adopted.  ROS:   Please see the history of present illness.    ROS All other systems reviewed and are negative.   EKGs/Labs/Other Test Reviewed:    EKG:  EKG is  ordered today.  The ekg ordered today demonstrates sinus brady, HR 59, normal axis, QTc 435 ms  Recent Labs: 09/10/2016: BUN 12; Creatinine, Ser 0.72; Hemoglobin 15.1; Platelets 167; Potassium 4.0; Sodium 135   Recent Lipid Panel No results found for: CHOL, TRIG, HDL, CHOLHDL, VLDL, LDLCALC, LDLDIRECT   Physical Exam:    VS:  BP 116/60   Pulse (!) 59   Ht 5\' 6"  (1.676 m)   Wt 178 lb (80.7 kg)   LMP 08/28/2016 (Approximate)   SpO2 97%   BMI 28.73 kg/m     Wt Readings from Last 3 Encounters:  09/21/16 178 lb (80.7 kg)  09/16/16 181 lb 3.2 oz (82.2 kg)     Physical Exam  Constitutional: She is oriented to person, place, and time. She appears well-developed and well-nourished. No distress.  HENT:  Head: Normocephalic and atraumatic.  Eyes: No scleral icterus.  Neck: Normal range of motion. No JVD present.  Cardiovascular: Normal rate, regular rhythm, S1 normal and S2 normal.   Murmur heard.  Holosystolic murmur is present with a grade of 2/6  at the lower left sternal border The intensity increases with valsalva. Pulmonary/Chest: Breath sounds normal. She has no wheezes. She has no rhonchi.  She has no rales.  Abdominal: Soft. There is no tenderness.  Musculoskeletal: She exhibits no edema.  Neurological: She is alert and oriented to person, place, and time.  Skin: Skin is warm and dry.  Psychiatric: She has a normal mood and affect.    ASSESSMENT:    1. HOCM (hypertrophic obstructive cardiomyopathy) (HCC)   2. History of cardiac arrest   3. ICD (implantable cardioverter-defibrillator) in place   4. Coronary-myocardial bridge    PLAN:    In order of problems listed above:  1. HOCM (hypertrophic obstructive cardiomyopathy) (HCC) -  She has a hx of HOCM with last Echo in 2014  showing LVOT gradient of 27. She denies any syncope.  She has chronic chest pain and shortness of breath that is unchanged.  She has not had Cardiology follow up in years.  She was previously on beta-blocker and calcium channel blocker.  She does have a heart cath from 2014 showing myocardial bridging of the mid LAD.  At that time, her Nuclear stress test was neg for ischemia and medical Rx was recommended with a beta-blocker.  She is currently off of medications and has not sought medical follow up due to lack of health insurance. She needs a note to start working at a job with [employer].  I reviewed her case with Dr. Everette RankJay Varanasi (DOD) today who also saw the patient.  As she has an ICD and a hx of HOCM, we felt that she should be followed long term by Dr. Sherryl MangesSteven Klein.  We are not clear what lifting restrictions she should have for her job.  I will review her case with Dr. Graciela HusbandsKlein today.  We will send a note to her prospective employer later today after review with Dr. Graciela HusbandsKlein.  -  Start Metoprolol Tartrate 12.5 mg Twice daily   -  Schedule echocardiogram   2. History of cardiac arrest - This occurred in the setting of significant drug abuse.  She denies recurrent use.  3. ICD (implantable cardioverter-defibrillator) in place - His device was checked today.  There have been no arrhythmias or  therapies delivered.  FU with EP.   4. Myocardial Bridging -  As noted, will try to resume low dose beta-blocker with Metoprolol Tartrate.   Dispo:  Return in about 3 months (around 12/22/2016) for Establishment with Dr. Sherryl MangesSteven Klein.   Medication Adjustments/Labs and Tests Ordered: Current medicines are reviewed at length with the patient today.  Concerns regarding medicines are outlined above.  Orders/Tests:  Orders Placed This Encounter  Procedures  . EKG 12-Lead  . ECHOCARDIOGRAM COMPLETE   Medication changes: Meds ordered this encounter  Medications  . DISCONTD: metoprolol tartrate (LOPRESSOR) 25 MG tablet    Sig: Take 0.5 tablets (12.5 mg total) by mouth 2 (two) times daily.    Dispense:  30 tablet    Refill:  11  . metoprolol tartrate (LOPRESSOR) 25 MG tablet    Sig: Take 0.5 tablets (12.5 mg total) by mouth 2 (two) times daily.    Dispense:  30 tablet    Refill:  [address]   Signed, Tereso NewcomerScott Weaver, [place]-C  09/21/2016 12:49 PM    [facility] [address], [place], [place]  [address] Phone: [phone]; Fax: [phone]   I have examined the patient and reviewed assessment and plan and discussed with patient.  Agree with above as stated.  Patient seen with Tereso NewcomerScott Weaver.  Await his recs regarding her ability to complete her job.  Given her issues, she will be followed by Dr. Graciela HusbandsKlein.  He has agreed to be her primary cardiologist.  Lance MussJayadeep Varanasi

## 2016-09-21 NOTE — Progress Notes (Signed)
Reviewed with Dr. Sherryl MangesSteven Klein. It is ok to allow her to work and there are no specific lifting restrictions.  I will provide her prospective employer with a letter. Tereso NewcomerScott Vernice Bowker, PA-C    09/21/2016 4:57 PM

## 2016-09-21 NOTE — Telephone Encounter (Signed)
I will forward to Tereso NewcomerScott Weaver, [place]. I believe PA was needing to s/w Dr. Graciela HusbandsKlein in regards to pt before agreeing to do note.

## 2016-09-21 NOTE — Telephone Encounter (Signed)
New message    Pt is calling about paperwork she needed.

## 2016-09-22 ENCOUNTER — Telehealth: Payer: Self-pay | Admitting: Physician Assistant

## 2016-09-22 NOTE — Telephone Encounter (Signed)
Advised pt that I did try to reach her last night about 5:20 to let her know that work note ready for pick up at the front desk. Pt agreeable to pick up letter. I said if her employer has any questions they can call the office.

## 2016-09-22 NOTE — Telephone Encounter (Signed)
New Message   pt verbalized that she is calling for rn   Pt need a note for her employer that states she can work

## 2016-09-24 ENCOUNTER — Other Ambulatory Visit: Payer: Self-pay

## 2016-09-24 ENCOUNTER — Encounter: Payer: Self-pay | Admitting: Physician Assistant

## 2016-09-24 ENCOUNTER — Ambulatory Visit (HOSPITAL_COMMUNITY): Payer: Self-pay | Attending: Cardiology

## 2016-09-24 DIAGNOSIS — I421 Obstructive hypertrophic cardiomyopathy: Secondary | ICD-10-CM | POA: Insufficient documentation

## 2016-09-24 LAB — ECHOCARDIOGRAM COMPLETE
E decel time: 472 msec
E/e' ratio: 16.08
FS: 42 % (ref 28–44)
IVS/LV PW RATIO, ED: 1.59
LA ID, A-P, ES: 42 mm
LA diam end sys: 42 mm
LA diam index: 2.21 cm/m2
LA vol A4C: 110 ml
LA vol index: 54.7 mL/m2
LA vol: 104 mL
LV E/e' medial: 16.08
LV E/e'average: 16.08
LV PW d: 15.5 mm — AB (ref 0.6–1.1)
LV e' LATERAL: 3.44 cm/s
LVOT area: 2.84 cm2
LVOT diameter: 19 mm
Lateral S' vel: 13.1 cm/s
MV Dec: 472
MV pk A vel: 39.8 m/s
MV pk E vel: 55.3 m/s
TDI e' lateral: 3.44
TDI e' medial: 3.56

## 2016-09-28 ENCOUNTER — Encounter: Payer: Self-pay | Admitting: Physician Assistant

## 2016-09-29 ENCOUNTER — Inpatient Hospital Stay (INDEPENDENT_AMBULATORY_CARE_PROVIDER_SITE_OTHER): Payer: Self-pay | Admitting: Physician Assistant

## 2016-09-29 ENCOUNTER — Telehealth: Payer: Self-pay | Admitting: *Deleted

## 2016-09-29 NOTE — Telephone Encounter (Signed)
Lmtcb to go over echo results and recommendations.

## 2016-09-29 NOTE — Telephone Encounter (Signed)
-----   Message from Scott T Weaver, PA-C sent at 09/28/2016  4:34 PM EDT ----- Please call the patient. Her echocardiogram shows normal LV function.  Findings consistent with hypertrophic obstructive cardiomyopathy remain stable. Continue Metoprolol as recommended at recent appointment.  Keep follow up with Dr. Steven Klein in August 2018 as planned.  Please fax a copy of this study result to her PCP:  Gomez, Roger David, PA-C  Thanks! Scott Weaver, PA-C    09/28/2016 4:21 PM 

## 2016-10-01 NOTE — Telephone Encounter (Signed)
Pt has been notified of echo results and findings by phone with verbal understanding. Pt agreeable to plan of care to continue on medications as prescribed (Metoprolol) and to keep appt with Dr. Graciela HusbandsKlein in 12/2016. Pt thanked me for my call.

## 2016-10-01 NOTE — Telephone Encounter (Signed)
-----   Message from Beatrice LecherScott T Weaver, [place]PA-C sent at 09/28/2016  4:34 PM EDT ----- Please call the patient. Her echocardiogram shows normal LV function.  Findings consistent with hypertrophic obstructive cardiomyopathy remain stable. Continue Metoprolol as recommended at recent appointment.  Keep follow up with Dr. Sherryl MangesSteven Klein in August 2018 as planned.  Please fax a copy of this study result to her PCP:  Loletta SpecterGomez, Roger David, PA-C  Thanks! Tereso NewcomerScott Weaver, PA-C    09/28/2016 4:21 PM

## 2016-12-31 ENCOUNTER — Encounter: Payer: Self-pay | Admitting: Internal Medicine

## 2016-12-31 ENCOUNTER — Ambulatory Visit (INDEPENDENT_AMBULATORY_CARE_PROVIDER_SITE_OTHER): Payer: Self-pay | Admitting: Internal Medicine

## 2016-12-31 VITALS — BP 100/64 | HR 100 | Ht 65.0 in | Wt 182.0 lb

## 2016-12-31 DIAGNOSIS — Z95810 Presence of automatic (implantable) cardiac defibrillator: Secondary | ICD-10-CM

## 2016-12-31 DIAGNOSIS — I421 Obstructive hypertrophic cardiomyopathy: Secondary | ICD-10-CM

## 2016-12-31 DIAGNOSIS — Z8674 Personal history of sudden cardiac arrest: Secondary | ICD-10-CM

## 2016-12-31 NOTE — Patient Instructions (Addendum)
Medication Instructions: - Your physician recommends that you continue on your current medications as directed. Please refer to the Current Medication list given to you today.  Labwork: -None Ordered  Procedures/Testing: - Your physician has requested that you have an echocardiogram. Echocardiography is a painless test that uses sound waves to create images of your heart. It provides your doctor with information about the size and shape of your heart and how well your heart's chambers and valves are working. This procedure takes approximately one hour. There are no restrictions for this procedure.   Follow-Up: - Your physician recommends that you schedule a follow-up appointment on Monday June 27, 2017 at 9:00 am with the [facility]   If you need a refill on your cardiac medications before your next appointment, please call your pharmacy.

## 2016-12-31 NOTE — Progress Notes (Addendum)
ELECTROPHYSIOLOGY OFFICE  NOTE  Patient ID: Sara HavensSamantha Saldierna, MRN: [id number], DOB/AGE: [birth year] 21 y.o. Admit date: (Not on file) Date of Consult: 12/31/2016  Primary Physician: Loletta SpecterGomez, Roger David, PA-C Primary Cardiologist: previously [facility]  Sara Lopez is a 21 y.o. female is being seen today To establish ICD care. She has a history of aborted cardiac arrest in the context of hypertrophic obstructive cardiomyopathy.   Chief Complaint: As above    HPI Sara Lopez is a 21 y.o. female with a history of polysubstance abuse who was in detox when she collapsed during exertion. She had ventricular fibrillation apparently and was successfully defibrillated. She underwent an evaluation. 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 She's had no interval discharges.  She has a history of recurrent chest pain. [facility] had demonstrated myocardial bridging with nuclear stress test was negative.    She has been treated in the past with verapamil. Most recently on low-dose beta blockers up titration of which has been limited by bradycardia, blood pressure compliance.  She was seen in the [facility] with a minimal elevation of troponin 5/18.  She has been drug free for about a year. She doesn't drink alcohol sometimes a half a case on the weekend.  She has no known family history as she is adopted from [place]  Past Medical History:  Diagnosis Date  . ADHD   . Coronary-myocardial bridge    [facility]HC [facility]14: LAD mid myocardial bridge (patent in diastole and near complete occlusion distally) // Myoview 2/14: Inferior attenuation, no ischemia, normal EF  . Depression   . Development delay   . History of echocardiogram 08/2016   Echo 5/18: severe asymmetric septal hypertrophy, EF 60-65, dynamic obstruction at rest, peak LVOT 328 cm/sec and peak gradient 43 mmHg, no RWMA, septal thickness 24.6 mm, post wall thickness  15.5 mm, +SAM, mild MR, severe LAE, PASP 30  . [facility] of echocardi[facility] 1.[facility]   Echo 1/14: Hypertrophic CM, mild dynamic LVOT - peak 27 mmHg, chordal SAM with LVOT obstruction, mild MR, dilate LCA, abnormal LV diastolic function, hyperdynamic LV systolic function   . HOCM (hypertrophic obstructive cardiomyopathy) (HCC)   . ICD (implantable cardioverter-defibrillator) in place    Medtronic model D334DRM (Protect), serial number [id number], implanted 04/30/2010   . Myocardial infarction (HCC)    hx of elevated Tn levels // [facility] with myocardial bridging  . VF (ventricular fibrillation) (HCC)    admitted in 2011 with VF arrest >> dx with HOCM // s/p ICD      Surgical History:  Past Surgical History:  Procedure Laterality Date  . ICD IMPLANT  2011     Home Meds: Prior to Admission medications   Medication Sig Start Date End Date Taking? Authorizing Provider  metoprolol tartrate (LOPRESSOR) 25 MG tablet Take 0.5 tablets (12.5 mg total) by mouth 2 (two) times daily. 09/21/16 12/31/16 Yes Tereso NewcomerWeaver, Scott T, PA-C      Allergies: No Known Allergies  Social History   Social History  . Marital status: Single    Spouse name: N/A  . Number of children: N/A  . Years of education: N/A   Occupational History  . cashier    Social History Main Topics  . Smoking status: Current Some Day Smoker  . Smokeless tobacco: Never Used  . Alcohol use 0.6 oz/week    1 Glasses of wine per week  Comment: occasional  . Drug use: No     Comment: no drugs since 2011  . Sexual activity: Not on file   Other Topics Concern  . 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 Adopted: Yes     ROS:  Please see the history of present illness.     All other systems reviewed and negative.    Physical  Exam: Blood pressure 100/64, pulse 100, height 5\' 5"  (1.651 m), weight 182 lb (82.6 kg), SpO2 98 %. General: Well developed, well nourished female in no acute distress. Head: Normocephalic, atraumatic, sclera non-icteric, no xanthomas, nares are without discharge. EENT: normal Lymph Nodes:  none Back: without scoliosis/kyphosis, no CVA tendersness Neck: Negative for carotid bruits. JVD not elevated. Lungs: Clear bilaterally to auscultation without wheezes, rales, or rhonchi. Breathing is unlabored. Heart: RRR with S1 S2.  2 /6 systolic  murmur enhances with valsalva, rubs, or gallops appreciated. Abdomen: Soft, non-tender, non-distended with normoactive bowel sounds. No hepatomegaly. No rebound/guarding. No obvious abdominal masses. Msk:  Strength and tone appear normal for age. Extremities: No clubbing or cyanosis. No edema.  Distal pedal pulses are 2+ and equal bilaterally. Skin: Warm and Dry Neuro: Alert and oriented X 3. CN III-XII intact Grossly normal sensory and motor function . Psych:  Responds to questions appropriately with a normal affect.      Labs: Cardiac Enzymes No results for input(s): CKTOTAL, CKMB, TROPONINI in the last 72 hours. CBC Lab Results  Component Value Date   WBC 5.3 09/10/2016   HGB 15.1 (H) 09/10/2016   HCT 43.2 09/10/2016   MCV 87.3 09/10/2016   PLT 167 09/10/2016   PROTIME: No results for input(s): LABPROT, INR in the last 72 hours. Chemistry No results for input(s): NA, K, CL, CO2, BUN, CREATININE, CALCIUM, PROT, BILITOT, ALKPHOS, ALT, AST, GLUCOSE in the last 168 hours.  Invalid input(s): LABALBU Lipids No results found for: CHOL, HDL, LDLCALC, TRIG BNP No results found for: PROBNP Thyroid Function Tests: No results for input(s): TSH, T4TOTAL, T3FREE, THYROIDAB in the last 72 hours.  Invalid input(s): FREET3    Miscellaneous No results found for: DDIMER  Radiology/Studies:  No results found.  EKG:  NSR 79 16/10/39 Nonspecific ST     Assessment and Plan:   HOCM  Cardiac Arrest Resuscitated  ICD - Medtronic  Alcohol use   Hx of polysubstance abuse   She has a history of aborted cardiac arrest in the context of hypertrophic obstructive cardiomyopathy. We reviewed physiology and potential contributors including sympathomimetics.  We have reviewed the use of beta blockers; she will try and take her beta blocker twice daily as it made her feel considerably better. We will repeat a ultrasound on her heart to look at outflow obstruction and LV function.  Her device is approaching ERI. We will establish [facility] and anticipate six-month follow-up  Sherryl Manges

## 2017-01-28 ENCOUNTER — Other Ambulatory Visit: Payer: Self-pay

## 2017-01-28 ENCOUNTER — Ambulatory Visit (HOSPITAL_COMMUNITY): Payer: BLUE CROSS/BLUE SHIELD | Attending: Internal Medicine

## 2017-01-28 DIAGNOSIS — I071 Rheumatic tricuspid insufficiency: Secondary | ICD-10-CM | POA: Diagnosis not present

## 2017-01-28 DIAGNOSIS — I421 Obstructive hypertrophic cardiomyopathy: Secondary | ICD-10-CM | POA: Diagnosis present

## 2017-01-28 DIAGNOSIS — I371 Nonrheumatic pulmonary valve insufficiency: Secondary | ICD-10-CM | POA: Insufficient documentation

## 2017-03-08 ENCOUNTER — Telehealth: Payer: Self-pay | Admitting: Physician Assistant

## 2017-03-08 NOTE — Telephone Encounter (Signed)
° °  [facility] Pre-operative Risk Assessment    Request for surgical clearance:  1. What type of surgery is being performed? Removal of 4 Wisdom teeth   2. When is this surgery scheduled? 03-10-17   3. Are there any medications that need to be held prior to surgery and how long?General Cardiac Clearance -Need to also know what medicine she takes  4. Practice name and name of physician performing surgery?Dr Frederik Schmidt   5. What is your office phone and fax number? [phone] and Fax number is [phone]   6. Anesthesia type (None, local, MAC, general) ?Lidocainde with Epinephrine   Glyn Ade 03/08/2017, 12:31 PM  _________________________________________________________________   (provider comments below)

## 2017-03-09 NOTE — Telephone Encounter (Signed)
    Chart reviewed as part of pre-operative protocol coverage. Patient was contacted 03/09/2017 in reference to pre-operative risk assessment for pending surgery as outlined below.  Lora HavensSamantha Vanderhoef was last seen on Dr. Graciela HusbandsKlein by 12/31/2016.  Since that day, Lora HavensSamantha Bridgett has done well. She has had no chest discomfort, shortness of breath, palpitations, dizziness or syncope.  I called Dr. Graciela HusbandsKlein who states that the patient is cleared to have her tooth extraction, limiting the use of epinephrine to as little as possible. I have also conveyed this to the patient.   Therefore, based on ACC/AHA guidelines, the patient would be at acceptable risk for the planned procedure without further cardiovascular testing.   Berton BonJanine Kennice Finnie, NP 03/09/2017, 4:37 PM

## 2017-03-09 NOTE — Telephone Encounter (Signed)
Follow up   Needs this clearance back today for patient

## 2017-03-10 NOTE — Telephone Encounter (Signed)
Will fwd this re: fax to Dr. Ethelene Brownsodd Owsley's office.

## 2017-05-25 ENCOUNTER — Other Ambulatory Visit: Payer: Self-pay

## 2017-05-25 ENCOUNTER — Emergency Department (HOSPITAL_COMMUNITY)
Admission: EM | Admit: 2017-05-25 | Discharge: 2017-05-25 | Disposition: A | Discharge status: Home / Self Care | Payer: BLUE CROSS/BLUE SHIELD | Attending: Emergency Medicine | Admitting: Emergency Medicine

## 2017-05-25 ENCOUNTER — Encounter (HOSPITAL_COMMUNITY): Payer: Self-pay | Admitting: Emergency Medicine

## 2017-05-25 DIAGNOSIS — R109 Unspecified abdominal pain: Secondary | ICD-10-CM | POA: Insufficient documentation

## 2017-05-25 DIAGNOSIS — I252 Old myocardial infarction: Secondary | ICD-10-CM | POA: Insufficient documentation

## 2017-05-25 DIAGNOSIS — Z8674 Personal history of sudden cardiac arrest: Secondary | ICD-10-CM | POA: Insufficient documentation

## 2017-05-25 DIAGNOSIS — M62830 Muscle spasm of back: Secondary | ICD-10-CM | POA: Diagnosis present

## 2017-05-25 DIAGNOSIS — M546 Pain in thoracic spine: Secondary | ICD-10-CM | POA: Insufficient documentation

## 2017-05-25 DIAGNOSIS — F17210 Nicotine dependence, cigarettes, uncomplicated: Secondary | ICD-10-CM | POA: Insufficient documentation

## 2017-05-25 LAB — BASIC METABOLIC PANEL
Anion gap: 11 (ref 5–15)
BUN: 10 mg/dL (ref 6–20)
CALCIUM: 9.1 mg/dL (ref 8.9–10.3)
CO2: 21 mmol/L — ABNORMAL LOW (ref 22–32)
CREATININE: 0.83 mg/dL (ref 0.44–1.00)
Chloride: 106 mmol/L (ref 101–111)
GFR calc Af Amer: >60 mL/min (ref >60)
GLUCOSE: 87 mg/dL (ref 65–99)
Potassium: 4 mmol/L (ref 3.5–5.1)
SODIUM: 138 mmol/L (ref 135–145)

## 2017-05-25 LAB — URINALYSIS, ROUTINE W REFLEX MICROSCOPIC
Bilirubin Urine: NEGATIVE
GLUCOSE, UA: NEGATIVE mg/dL
Hgb urine dipstick: NEGATIVE
KETONES UR: 5 mg/dL — AB
LEUKOCYTES UA: NEGATIVE
Nitrite: NEGATIVE
PH: 5 (ref 5.0–8.0)
Protein, ur: NEGATIVE mg/dL
SPECIFIC GRAVITY, URINE: 1.019 (ref 1.005–1.030)

## 2017-05-25 LAB — CBC
HEMATOCRIT: 39.9 % (ref 36.0–46.0)
Hemoglobin: 14 g/dL (ref 12.0–15.0)
MCH: 30.8 pg (ref 26.0–34.0)
MCHC: 35.1 g/dL (ref 30.0–36.0)
MCV: 87.7 fL (ref 78.0–100.0)
PLATELETS: 191 10*3/uL (ref 150–400)
RBC: 4.55 MIL/uL (ref 3.87–5.11)
RDW: 12.1 % (ref 11.5–15.5)
WBC: 5.6 10*3/uL (ref 4.0–10.5)

## 2017-05-25 LAB — I-STAT BETA HCG BLOOD, ED (MC, WL, AP ONLY)

## 2017-05-25 MED ORDER — CYCLOBENZAPRINE HCL 5 MG PO TABS: 5.0000 mg | ORAL_TABLET | Freq: Two times a day (BID) | ORAL | 0 refills | Status: DC

## 2017-05-25 MED ORDER — OXYCODONE-ACETAMINOPHEN 5-325 MG PO TABS
1.0000 | ORAL_TABLET | ORAL | Status: DC | PRN
  Administered 2017-05-25: 1 via ORAL
  Filled 2017-05-25: qty 1

## 2017-05-25 NOTE — ED Notes (Signed)
Declined W/C at D/C and was escorted to lobby by RN. 

## 2017-05-25 NOTE — ED Provider Notes (Signed)
[facility]ENC[facility] Provider Note   CSN: [id number] Arrival date & time: 05/25/17  1252     History   Chief Complaint Chief Complaint  Patient presents with  . Abdominal Pain  . Flank Pain    HPI Sara Lopez is a 22 y.o. female who presents to the ED with left side pain that started suddenly while at work today.she describes the pain as sharp sudden. Patient came to the ED and since arrival the pain has subsided. Patient denies n/v. But reports she is extremely hungry and ready to go. Patient has a hx of cardiac problems but denies chest pain, n/v or anything related to her heart.   The history is provided by the patient. No language interpreter was used.  Flank Pain  Pertinent negatives include no chest pain, no abdominal pain, no headaches and no shortness of breath.  Back Pain   This is a new problem. The current episode started 6 to 12 hours ago. The problem has been resolved. The pain is associated with no known injury. Pain location: left thoracic. The pain is mild. Pertinent negatives include no chest pain, no fever, no weight loss, no headaches, no abdominal pain, no bowel incontinence, no bladder incontinence, no dysuria and no leg pain. She has tried nothing for the symptoms.    Past Medical History:  Diagnosis Date  . ADHD   . Coronary-myocardial bridge    [facility]: LAD mid myocardial bridge (patent in diastole and near complete occlusion distally) // Myoview 2/14: Inferior attenuation, no ischemia, normal EF  . Depression   . Development delay   . History of echocardiogram 08/2016   Echo 5/18: severe asymmetric septal hypertrophy, EF 60-65, dynamic obstruction at rest, peak LVOT 328 cm/sec and peak gradient 43 mmHg, no RWMA, septal thickness 24.6 mm, post wall thickness 15.5 mm, +SAM, mild MR, severe LAE, PASP 30  . [facility]   Echo 1/14: Hypertrophic CM, mild dynamic LVOT - peak 27 mmHg,  chordal SAM with LVOT obstruction, mild MR, dilate LCA, abnormal LV diastolic function, hyperdynamic LV systolic function   . HOCM (hypertrophic obstructive cardiomyopathy) (HCC)   . ICD (implantable cardioverter-defibrillator) in place    Medtronic model D334DRM (Protect), serial number [id number], implanted 04/30/2010   . Myocardial infarction (HCC)    hx of elevated Tn levels // [facility]  . VF (ventricular fibrillation) (HCC)    admitted in 2011 with VF arrest >> dx with HOCM // s/p ICD    Patient Active Problem List   Diagnosis Date Noted  . HOCM (hypertrophic obstructive cardiomyopathy) (HCC) 09/21/2016  . History of cardiac arrest 09/21/2016  . Coronary-myocardial bridge   . History of MI (myocardial infarction) 09/16/2016  . ICD (implantable cardioverter-defibrillator) in place 09/16/2016    Past Surgical History:  Procedure Laterality Date  . ICD IMPLANT  2011    OB History    No data available       Home Medications    Prior to Admission medications   Medication Sig Start Date End Date Taking? Authorizing Provider  cyclobenzaprine (FLEXERIL) 5 MG tablet Take 1 tablet (5 mg total) by mouth 2 (two) times daily. 05/25/17   Janne NapoleonNeese, Vergil Burby M, NP  metoprolol tartrate (LOPRESSOR) 25 MG tablet Take 0.5 tablets (12.5 mg total) by mouth 2 (two) times daily. 09/21/16 12/31/16  Beatrice LecherWeaver, Scott T, PA-C    Family History Family History  Adopted: Yes    Social History Social History   Tobacco Use  . Smoking status: Current Some Day Smoker  . Smokeless tobacco: Never Used  Substance Use Topics  . Alcohol use: Yes    Alcohol/week: 0.6 oz    Types: 1 Glasses of wine per week    Comment: occasional  . Drug use: No    Comment: no drugs since 2011     Allergies   Patient has no known allergies.   Review of Systems Review of Systems  Constitutional: Negative for chills, fever and weight loss.  HENT: Negative.   Eyes: Negative for  visual disturbance.  Respiratory: Negative for shortness of breath.   Cardiovascular: Negative for chest pain.  Gastrointestinal: Negative for abdominal pain, bowel incontinence, nausea and vomiting.  Genitourinary: Positive for flank pain. Negative for bladder incontinence and dysuria.  Musculoskeletal: Positive for back pain.  Skin: Negative for wound.  Neurological: Negative for syncope and headaches.  Hematological: Negative for adenopathy.  Psychiatric/Behavioral: Negative for confusion.     Physical Exam Updated Vital Signs BP 107/61 (BP Location: Left Arm)   Pulse 63   Temp 97.6 F (36.4 C) (Oral)   Resp 16   LMP 05/03/2017   SpO2 100%   Physical Exam  Constitutional: She is oriented to person, place, and time. She appears well-developed and well-nourished. She does not appear ill. No distress.  HENT:  Head: Normocephalic.  Eyes: Conjunctivae and EOM are normal. Pupils are equal, round, and reactive to light.  Neck: Normal range of motion. Neck supple.  Cardiovascular: Normal rate and regular rhythm.  Murmur heard. Pulmonary/Chest: Effort normal and breath sounds normal.  Abdominal: Soft. Normal appearance and bowel sounds are normal. There is no tenderness.  Musculoskeletal:       Thoracic back: She exhibits tenderness and spasm. She exhibits normal pulse.  Grips equal, radial pulses 2+, adequate circulation. Tender with palpation of left thoracic area. Muscle spasm noted. Pain with range of motion.   Neurological: She is alert and oriented to person, place, and time. She has normal strength. No cranial nerve deficit or sensory deficit. Gait normal.  Reflex Scores:      Patellar reflexes are 2+ on the right side and 2+ on the left side. Skin: Skin is warm and dry.  Psychiatric: She has a normal mood and affect.  Nursing note and vitals reviewed.    ED Treatments / Results  Labs (all labs ordered are listed, but only abnormal results are displayed) Labs Reviewed   URINALYSIS, ROUTINE W REFLEX MICROSCOPIC - Abnormal; Notable for the following components:      Result Value   Ketones, ur 5 (*)    All other components within normal limits  BASIC METABOLIC PANEL - Abnormal; Notable for the following components:   CO2 21 (*)    All other components within normal limits  CBC  I-STAT BETA HCG BLOOD, ED ([facility], [facility], [facility] ONLY)     Radiology No results found.  Procedures Procedures (including critical care time)  Medications Ordered in ED Medications  oxyCODONE-acetaminophen (PERCOCET/ROXICET) 5-325 MG per tablet 1 tablet (1 tablet Oral Given 05/25/17 1323)     Initial Impression / Assessment and Plan / ED Course  I have reviewed the triage vital signs and the nursing notes. Patient with back pain.  No neurological deficits and normal neuro exam.  Patient can walk but states is painful.  No loss of bowel or bladder control.  No concern for cauda equina.  No fever, no UTI symptoms. Discussed with the patient f/u and plan of care. Patient agrees with plan.   Final Clinical Impressions(s) / ED Diagnoses   Final diagnoses:  Muscle spasm of back  Acute left-sided thoracic back pain    ED Discharge Orders        Ordered    cyclobenzaprine (FLEXERIL) 5 MG tablet  2 times daily     05/25/17 1726       Damian Leavell St[place]brook, NP 05/25/17 1733    Nira Conn, MD 05/26/17 [phone]

## 2017-05-25 NOTE — ED Triage Notes (Signed)
Pt to ER for evaluation of left flank pain radiating to left lower abdomen. Reports sudden in onset while at work. Denies nausea, vomiting, difficulty urinating. VSS

## 2017-05-25 NOTE — ED Notes (Signed)
Lab work, radiology results and vital signs reviewed, no critical results at this time, no change in acuity indicated.  

## 2017-05-25 NOTE — Discharge Instructions (Signed)
You can take tylenol or other medications as you usually do in addition to the medication we give you. The muscle relaxer can make you sleepy. Do not drive or do other activity that may cause injury while taking the medication. Follow up with your doctor. Return here as needed.

## 2017-07-13 ENCOUNTER — Ambulatory Visit (INDEPENDENT_AMBULATORY_CARE_PROVIDER_SITE_OTHER): Payer: BLUE CROSS/BLUE SHIELD | Admitting: *Deleted

## 2017-07-13 DIAGNOSIS — I421 Obstructive hypertrophic cardiomyopathy: Secondary | ICD-10-CM | POA: Diagnosis not present

## 2017-07-13 LAB — CUP PACEART INCLINIC DEVICE CHECK
Brady Statistic AP VP Percent: 0 %
Brady Statistic AP VS Percent: 0 %
Brady Statistic AS VS Percent: 97.94 %
Date Time Interrogation Session: 20190313165026
HIGH POWER IMPEDANCE MEASURED VALUE: 54 Ohm
HighPow Impedance: 931 Ohm
Implantable Lead Location: 753860
Implantable Lead Model: 7122
Lead Channel Pacing Threshold Amplitude: 2 V
Lead Channel Pacing Threshold Pulse Width: 1 ms
Lead Channel Setting Pacing Amplitude: 3.5 V
Lead Channel Setting Sensing Sensitivity: 0.6 mV
MDC IDC LEAD IMPLANT DT: 20111229
MDC IDC MSMT BATTERY VOLTAGE: 2.63 V
MDC IDC MSMT LEADCHNL RA IMPEDANCE VALUE: 4047 Ohm
MDC IDC MSMT LEADCHNL RA SENSING INTR AMPL: 6.125 mV
MDC IDC MSMT LEADCHNL RV IMPEDANCE VALUE: 1026 Ohm
MDC IDC MSMT LEADCHNL RV SENSING INTR AMPL: 18.125 mV
MDC IDC MSMT LEADCHNL RV SENSING INTR AMPL: 23.625 mV
MDC IDC PG IMPLANT DT: 20111229
MDC IDC SET LEADCHNL RV PACING PULSEWIDTH: 1 ms
MDC IDC STAT BRADY AS VP PERCENT: 2.06 %
MDC IDC STAT BRADY RA PERCENT PACED: 0 %
MDC IDC STAT BRADY RV PERCENT PACED: 1.36 %

## 2017-07-13 NOTE — Progress Notes (Signed)
ICD check in clinic. Normal device function. Threshold and sensing consistent with previous device measurements. Impedance trends stable over time. No evidence of any ventricular arrhythmias. Histogram distribution appropriate for patient and level of activity. No changes made this session. Device programmed at appropriate safety margins. Device programmed to optimize intrinsic conduction. Battery Voltage 2.64V ( RRT 2.63V)Pt enrolled in remote follow-up. Patient education completed including shock plan. Alert tones demonstrated for patient. Pt educated on how to send a transmission. Remote scheduled for 10/12/2017

## 2017-10-12 ENCOUNTER — Encounter: Payer: BLUE CROSS/BLUE SHIELD | Admitting: *Deleted

## 2017-10-12 ENCOUNTER — Telehealth: Payer: Self-pay | Admitting: Cardiology

## 2017-10-12 NOTE — Telephone Encounter (Signed)
Attempted to confirm remote transmission with pt. No answer and was unable to leave a message. Automated message stating that I had reached [employer] payment center.

## 2017-10-13 ENCOUNTER — Encounter: Payer: Self-pay | Admitting: Cardiology

## 2017-10-14 ENCOUNTER — Ambulatory Visit (INDEPENDENT_AMBULATORY_CARE_PROVIDER_SITE_OTHER): Payer: BLUE CROSS/BLUE SHIELD | Admitting: *Deleted

## 2017-10-14 DIAGNOSIS — I421 Obstructive hypertrophic cardiomyopathy: Secondary | ICD-10-CM

## 2017-10-17 ENCOUNTER — Encounter: Payer: Self-pay | Admitting: Cardiology

## 2017-10-17 NOTE — Progress Notes (Signed)
Remote ICD transmission.   

## 2017-10-18 LAB — CUP PACEART REMOTE DEVICE CHECK
Battery Voltage: 2.62 V
Brady Statistic AP VS Percent: 0 %
Brady Statistic AS VS Percent: 96.77 %
Date Time Interrogation Session: 20190614120257
HIGH POWER IMPEDANCE MEASURED VALUE: 779 Ohm
HighPow Impedance: 57 Ohm
Implantable Lead Implant Date: 20111229
Implantable Lead Location: 753860
Implantable Lead Model: 7122
Lead Channel Sensing Intrinsic Amplitude: 20.75 mV
Lead Channel Sensing Intrinsic Amplitude: 20.75 mV
Lead Channel Sensing Intrinsic Amplitude: 6.125 mV
Lead Channel Setting Pacing Pulse Width: 1 ms
Lead Channel Setting Sensing Sensitivity: 0.6 mV
MDC IDC MSMT LEADCHNL RA IMPEDANCE VALUE: 4047 Ohm
MDC IDC MSMT LEADCHNL RV IMPEDANCE VALUE: 874 Ohm
MDC IDC PG IMPLANT DT: 20111229
MDC IDC SET LEADCHNL RV PACING AMPLITUDE: 3.5 V
MDC IDC STAT BRADY AP VP PERCENT: 0 %
MDC IDC STAT BRADY AS VP PERCENT: 3.23 %
MDC IDC STAT BRADY RA PERCENT PACED: 0 %
MDC IDC STAT BRADY RV PERCENT PACED: 2.16 %

## 2018-01-16 ENCOUNTER — Telehealth: Payer: Self-pay | Admitting: Cardiology

## 2018-01-16 ENCOUNTER — Ambulatory Visit: Payer: BLUE CROSS/BLUE SHIELD | Admitting: *Deleted

## 2018-01-16 NOTE — Progress Notes (Signed)
No transmission  

## 2018-01-16 NOTE — Telephone Encounter (Signed)
Attempted to confirm remote transmission with pt. No answer and was unable to leave a message.   

## 2018-01-17 ENCOUNTER — Encounter: Payer: Self-pay | Admitting: Cardiology

## 2018-02-16 ENCOUNTER — Ambulatory Visit (HOSPITAL_COMMUNITY)
Admission: EM | Admit: 2018-02-16 | Discharge: 2018-02-16 | Disposition: A | Discharge status: Home / Self Care | Payer: BLUE CROSS/BLUE SHIELD | Attending: Family Medicine | Admitting: Family Medicine

## 2018-02-16 ENCOUNTER — Encounter (HOSPITAL_COMMUNITY): Payer: Self-pay

## 2018-02-16 DIAGNOSIS — B349 Viral infection, unspecified: Secondary | ICD-10-CM

## 2018-02-16 NOTE — Discharge Instructions (Signed)
Continue with OTC medications as needed.  Tylenol and/or ibuprofen as needed for pain or fevers.  If symptoms worsen or do not improve in the next week to return to be seen or to follow up with PCP.   Ok to return to work.

## 2018-02-16 NOTE — ED Triage Notes (Signed)
Pt states she needs a work note to return to work 

## 2018-02-16 NOTE — ED Provider Notes (Signed)
[facility]    CSN: [id number] Arrival date & time: 02/16/18  1646     History   Chief Complaint Chief Complaint  Patient presents with  . Fever    HPI Sara Lopez is a 22 y.o. female.   Sara Lopez presents with requests for note to return to work. She states she had a fever for a few days with cold symptoms and nausea, as well as headache. Symptoms have improved, no fever since yesterday. No abdominal pain. Eating and drinking. Sore throat has improved. Has been taking dayquil, nyquil, and ibuprofen which have helped. Still with mild headache. Hx of adhd, hypertrophic obstructive cardiomyopathy, MI, and ICD. Not taking any medications currently.     ROS per HPI.      Past Medical History:  Diagnosis Date  . ADHD   . Coronary-myocardial bridge    LHC at [facility] 2/14: LAD mid myocardial bridge (patent in diastole and near complete occlusion distally) // Myoview 2/14: Inferior attenuation, no ischemia, normal EF  . Depression   . Development delay   . History of echocardiogram 08/2016   Echo 5/18: severe asymmetric septal hypertrophy, EF 60-65, dynamic obstruction at rest, peak LVOT 328 cm/sec and peak gradient 43 mmHg, no RWMA, septal thickness 24.6 mm, post wall thickness 15.5 mm, +SAM, mild MR, severe LAE, PASP 30  . History of echocardiogram 1.2014 at [facility]   Echo 1/14: Hypertrophic CM, mild dynamic LVOT - peak 27 mmHg, chordal SAM with LVOT obstruction, mild MR, dilate LCA, abnormal LV diastolic function, hyperdynamic LV systolic function   . HOCM (hypertrophic obstructive cardiomyopathy) (HCC)   . ICD (implantable cardioverter-defibrillator) in place    Medtronic model D334DRM (Protect), serial number [id number], implanted 04/30/2010   . Myocardial infarction (HCC)    hx of elevated Tn levels // LHC in 2014 at [facility] with myocardial bridging  . VF (ventricular fibrillation) (HCC)    admitted in 2011 with VF arrest >> dx with HOCM // s/p ICD    Patient  Active Problem List   Diagnosis Date Noted  . HOCM (hypertrophic obstructive cardiomyopathy) (HCC) 09/21/2016  . History of cardiac arrest 09/21/2016  . Coronary-myocardial bridge   . History of MI (myocardial infarction) 09/16/2016  . ICD (implantable cardioverter-defibrillator) in place 09/16/2016    Past Surgical History:  Procedure Laterality Date  . ICD IMPLANT  2011    OB History   None      Home Medications    Prior to Admission medications   Medication Sig Start Date End Date Taking? Authorizing Provider  cyclobenzaprine (FLEXERIL) 5 MG tablet Take 1 tablet (5 mg total) by mouth 2 (two) times daily. Patient not taking: Reported on 07/13/2017 05/25/17   Janne Napoleon, NP  metoprolol tartrate (LOPRESSOR) 25 MG tablet Take 0.5 tablets (12.5 mg total) by mouth 2 (two) times daily. 09/21/16 12/31/16  Beatrice Lecher, PA-C    Family History Family History  Adopted: Yes    Social History Social History   Tobacco Use  . Smoking status: Current Some Day Smoker  . Smokeless tobacco: Never Used  Substance Use Topics  . Alcohol use: Yes    Alcohol/week: 1.0 standard drinks    Types: 1 Glasses of wine per week    Comment: occasional  . Drug use: No    Types: Marijuana    Comment: no drugs since 2011     Allergies   Patient has no known allergies.   Review of Systems Review of Systems  Physical Exam Triage Vital Signs ED Triage Vitals  Enc Vitals Group     BP 02/16/18 1736 112/70     Pulse Rate 02/16/18 1736 71     Resp 02/16/18 1736 16     Temp 02/16/18 1736 97.8 F (36.6 C)     Temp Source 02/16/18 1736 Oral     SpO2 02/16/18 1736 100 %     Weight 02/16/18 1737 175 lb (79.4 kg)     Height --      Head Circumference --      Peak Flow --      Pain Score --      Pain Loc --      Pain Edu? --      Excl. in GC? --    No data found.  Updated Vital Signs BP 112/70 (BP Location: Left Arm)   Pulse 71   Temp 97.8 F (36.6 C) (Oral)   Resp 16   Wt  175 lb (79.4 kg)   LMP 02/06/2018   SpO2 100%   BMI 29.12 kg/m    Physical Exam  Constitutional: She is oriented to person, place, and time. She appears well-developed and well-nourished. No distress.  HENT:  Head: Normocephalic and atraumatic.  Right Ear: External ear and ear canal normal. Tympanic membrane is scarred.  Left Ear: Tympanic membrane, external ear and ear canal normal.  Nose: Nose normal.  Mouth/Throat: Uvula is midline, oropharynx is clear and moist and mucous membranes are normal. No tonsillar exudate.  Eyes: Pupils are equal, round, and reactive to light. Conjunctivae and EOM are normal.  Cardiovascular: Normal rate, regular rhythm and normal heart sounds.  Pulmonary/Chest: Effort normal and breath sounds normal.  Neurological: She is alert and oriented to person, place, and time.  Skin: Skin is warm and dry.     UC Treatments / Results  Labs (all labs ordered are listed, but only abnormal results are displayed) Labs Reviewed - No data to display  EKG None  Radiology No results found.  Procedures Procedures (including critical care time)  Medications Ordered in UC Medications - No data to display  Initial Impression / Assessment and Plan / UC Course  I have reviewed the triage vital signs and the nursing notes.  Pertinent labs & imaging results that were available during my care of the patient were reviewed by me and considered in my medical decision making (see chart for details).     Non toxic. Afebrile. Benign physical exam. Ok to go back to work with note provided, no restrictions. If symptoms worsen or do not improve in the next week to return to be seen or to follow up with PCP.  Patient verbalized understanding and agreeable to plan.   Final Clinical Impressions(s) / UC Diagnoses   Final diagnoses:  Viral illness     Discharge Instructions     Continue with OTC medications as needed.  Tylenol and/or ibuprofen as needed for pain or  fevers.  If symptoms worsen or do not improve in the next week to return to be seen or to follow up with PCP.   Ok to return to work.    ED Prescriptions    None     Controlled Substance Prescriptions [place] Controlled Substance Registry consulted? Not Applicable   Georgetta Haber, NP 02/16/18 0981

## 2018-02-17 ENCOUNTER — Telehealth: Payer: Self-pay | Admitting: Cardiology

## 2018-02-17 NOTE — Telephone Encounter (Signed)
Patient called and stated that she heard an alert tone from her ICD. I instructed pt to send a remote transmission w/ her home monitor. Pt stated that she is not home at the moment and will not be able to send a remote transmission until after 5. I instructed pt that if she feels light headed, short of breath, dizzy, or like she is going to pass she needs to go to the ER. Otherwise, she can send remote transmission and a Device Tech RN will review on Monday start of the business day. Pt verbalized understanding.

## 2018-02-20 NOTE — Telephone Encounter (Signed)
Remote transmission reviewed. Presenting rhythm: Vs. No episodes recorded. Stable lead measurements.   RRT reached on 02/15/18.  Called to inform patient that her device is now at [facility]. I explained to her that the device has 3 months before she's at risk of losing device function. I also told her that she will continue to hear the audible alert tone once daily until her device is checked in the office. I told patient that she could come to the device clinic to have the tone d/c'd. Patient states that she can wait until her appt with EP/APP.  Will forward to Glynda Jaeger for scheduling.

## 2018-03-20 NOTE — Progress Notes (Signed)
Cardiology Office Note Date:  03/21/2018  Patient ID:  Sara Lopez, Sara Lopez Feb 24, 1996, MRN [id number] PCP:  Loletta Specter, PA-C  Electrophysiologist; Dr. Graciela Husbands    Chief Complaint: device has reached ERI  History of Present Illness: Sara Lopez is a 22 y.o. female with history of HOCM w/ VF arrest w/ICD, ADHD, h/o drug use, reportedly using heavily at the time of her arrest, no longer uses.  New to [facility] may 2018, previously followed by Orlie Dakin, MD at [facility].  She has not been seen by Cardiology in several years.  She was admitted to [facility] in 2014 with chest pain and an elevated Troponin at an outside hospital.  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 Nuclear stress test was neg for ischemia.  She was tx with beta-blocker.  Review of prior notes indicates she was also on Verapamil.  She had an ER visit May 2018 with CP, mild Trop elevation felt to be 2/2 septal hypertrophy, no new w/u.  She was referred to Dr. Graciela Husbands to follow.  He last saw her Aug 2018, at that visit he noted she had been drug free for about a year, c/w ETOH particularly weekends.  Encouraged compliance with her BB, noted device was reaching ERI and planned for echo.  She is doing well.  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  She denies any kind of drug use, rarely smokes, only when she is drinking.  Unfortunately she has had some life stressors of late and has had an up-tick in her ETOH use.   No symptoms, no CP, palpitations or SOB, no dizziness, near syncope or syncope.  Device information: MDT single chamber ICD, 04/30/10, secondary prevention     Past Medical History:  Diagnosis Date  . ADHD   . Coronary-myocardial bridge    LHC at [facility] 2/14: LAD mid myocardial bridge (patent in diastole and near complete occlusion distally) // Myoview 2/14: Inferior attenuation, no ischemia, normal  EF  . Depression   . Development delay   . History of echocardiogram 08/2016   Echo 5/18: severe asymmetric septal hypertrophy, EF 60-65, dynamic obstruction at rest, peak LVOT 328 cm/sec and peak gradient 43 mmHg, no RWMA, septal thickness 24.6 mm, post wall thickness 15.5 mm, +SAM, mild MR, severe LAE, PASP 30  . History of echocardiogram 1.2014 at [facility]   Echo 1/14: Hypertrophic CM, mild dynamic LVOT - peak 27 mmHg, chordal SAM with LVOT obstruction, mild MR, dilate LCA, abnormal LV diastolic function, hyperdynamic LV systolic function   . HOCM (hypertrophic obstructive cardiomyopathy) (HCC)   . ICD (implantable cardioverter-defibrillator) in place    Medtronic model D334DRM (Protect), serial number [id number], implanted 04/30/2010   . Myocardial infarction (HCC)    hx of elevated Tn levels // LHC in 2014 at [facility] with myocardial bridging  . VF (ventricular fibrillation) (HCC)    admitted in 2011 with VF arrest >> dx with HOCM // s/p ICD    Past Surgical History:  Procedure Laterality Date  . ICD IMPLANT  2011    Current Outpatient Medications  Medication Sig Dispense Refill  . cyclobenzaprine (FLEXERIL) 5 MG tablet Take 1 tablet (5 mg total) by mouth 2 (two) times daily. 20 tablet 0  . metoprolol tartrate (LOPRESSOR) 25 MG tablet Take 0.5 tablets (12.5 mg total) by mouth 2 (two) times daily. 30 tablet 11   No current facility-administered  medications for this visit.     Allergies:   Patient has no known allergies.   Social History:  The patient  reports that she has been smoking. She has never used smokeless tobacco. She reports that she drinks about 1.0 standard drinks of alcohol per week. She reports that she does not use drugs.   Family History:  The patient's family history is not on file. She was adopted.  ROS:  Please see the history of present illness.  All other systems are reviewed and otherwise negative.   PHYSICAL EXAM:  VS:  BP 110/68   Pulse 64   Ht 5\' 6"   (1.676 m)   Wt 175 lb (79.4 kg)   BMI 28.25 kg/m  BMI: Body mass index is 28.25 kg/m. Well nourished, well developed, in no acute distress  HEENT: normocephalic, atraumatic  Neck: no JVD, carotid bruits or masses Cardiac:  RRR; no significant murmurs, no rubs, or gallops Lungs:  CTA b/l, no wheezing, rhonchi or rales  Abd: soft, nontender MS: no deformity or atrophy Ext: no edema  Skin: warm and dry, no rash Neuro:  No gross deficits appreciated Psych: euthymic mood, full affect  ICD site is stable, no tethering or discomfort   EKG:  Done today shows SR, unchanged from prior ICD interrogation done today and reviewed by myself: battery reached RRT 02/15/18, 2.1% VP, no episodes/events   01/28/17: TTE Study Conclusions - Left ventricle: Peak gradient with valsalva 34 mmHg. Chordal SAM   with small LVOT gradient peak velocity at rest less than 2 m/sec.   Wall thickness was increased in a pattern of moderate LVH.   Systolic function was normal. The estimated ejection fraction was   in the range of 60% to 65%. Doppler parameters are consistent   with abnormal left ventricular relaxation (grade 1 diastolic   dysfunction). - Left atrium: The atrium was moderately dilated. - Atrial septum: No defect or patent foramen ovale was identified.  [facility] LAD mid myocardial bridge (patent in diastole and near complete occlusion in systole) RCA, LCx normal  [facility] Inferior attenuation, no ischemia, normal EF  [facility] Hypertrophic CM, mild dynamic LVOT - peak 27 mmHg, chordal SAM with LVOT obstruction, mild MR, dilate LCA, abnormal LV diastolic function, hyperdynamic LV systolic function   Recent Labs: 05/25/2017: BUN 10; Creatinine, Ser 0.83; Hemoglobin 14.0; Platelets 191; Potassium 4.0; Sodium 138  No results found for requested labs within last 8760 hours.   CrCl cannot be calculated (Patient's most recent lab result is older than the  maximum 21 days allowed.).   Wt Readings from Last 3 Encounters:  03/21/18 175 lb (79.4 kg)  02/16/18 175 lb (79.4 kg)  12/31/16 182 lb (82.6 kg)     Other studies reviewed: Additional studies/records reviewed today include: summarized above  ASSESSMENT AND PLAN:  1. ICD     Battery is at RRT, lead measurements look stable     We discussed generator change procedure, potential risks and benefits, she would like to proceed      Low battery alert was turned off for her   2. Hx of Cardiac arrest     Occurred in setting of significant drug use     No symptoms, syncope or shocks   3. HOCM     No symptoms     Doing well on BB  4. ETOH use/abuse     Counseled, she was receptive  She denies any drug use for years    Disposition: F/u with routine post procedure f/u  Current medicines are reviewed at length with the patient today.  The patient did not have any concerns regarding medicines.  Norma Fredrickson, PA-C 03/21/2018 1:58 PM     [facility] [address] [place] [address] [phone] (office)  [phone] (fax)

## 2018-03-21 ENCOUNTER — Other Ambulatory Visit: Payer: BLUE CROSS/BLUE SHIELD

## 2018-03-21 ENCOUNTER — Encounter: Payer: Self-pay | Admitting: *Deleted

## 2018-03-21 ENCOUNTER — Ambulatory Visit (INDEPENDENT_AMBULATORY_CARE_PROVIDER_SITE_OTHER): Payer: BLUE CROSS/BLUE SHIELD | Admitting: Physician Assistant

## 2018-03-21 VITALS — BP 110/68 | HR 64 | Ht 66.0 in | Wt 175.0 lb

## 2018-03-21 DIAGNOSIS — I421 Obstructive hypertrophic cardiomyopathy: Secondary | ICD-10-CM

## 2018-03-21 DIAGNOSIS — Z01818 Encounter for other preprocedural examination: Secondary | ICD-10-CM

## 2018-03-21 DIAGNOSIS — Z4502 Encounter for adjustment and management of automatic implantable cardiac defibrillator: Secondary | ICD-10-CM

## 2018-03-21 DIAGNOSIS — I469 Cardiac arrest, cause unspecified: Secondary | ICD-10-CM | POA: Diagnosis not present

## 2018-03-21 NOTE — Patient Instructions (Addendum)
Medication Instructions:  . Your physician recommends that you continue on your current medications as directed. Please refer to the Current Medication list given to you today.  If you need a refill on your cardiac medications before your next appointment, please call your pharmacy.   Lab work:  RETURN FOR LABS CBC AND BMET  ON  04-14-18   If you have labs (blood work) drawn today and your tests are completely normal, you will receive your results only by: Marland Kitchen. MyChart Message (if you have MyChart) OR . A paper copy in the mail If you have any lab test that is abnormal or we need to change your treatment, we will call you to review the results.  Testing/Procedures:  SEE  LETTER  FOR GENERATOR CHANGE    Follow-Up: AFTER 04-21-18  10-14 DAYS  WITH  DEVICE CLINIC   91 DAYS PHYS  DEFIB CHK WITH KLEIN   Any Other Special Instructions Will Be Listed Below (If Applicable).

## 2018-03-23 ENCOUNTER — Ambulatory Visit (INDEPENDENT_AMBULATORY_CARE_PROVIDER_SITE_OTHER): Payer: BLUE CROSS/BLUE SHIELD

## 2018-03-23 ENCOUNTER — Ambulatory Visit (HOSPITAL_COMMUNITY)
Admission: EM | Admit: 2018-03-23 | Discharge: 2018-03-23 | Disposition: A | Discharge status: Home / Self Care | Payer: BLUE CROSS/BLUE SHIELD | Attending: Family Medicine | Admitting: Family Medicine

## 2018-03-23 ENCOUNTER — Other Ambulatory Visit: Payer: Self-pay

## 2018-03-23 ENCOUNTER — Encounter (HOSPITAL_COMMUNITY): Payer: Self-pay | Admitting: Emergency Medicine

## 2018-03-23 DIAGNOSIS — S39012A Strain of muscle, fascia and tendon of lower back, initial encounter: Secondary | ICD-10-CM

## 2018-03-23 DIAGNOSIS — M7918 Myalgia, other site: Secondary | ICD-10-CM

## 2018-03-23 DIAGNOSIS — S161XXA Strain of muscle, fascia and tendon at neck level, initial encounter: Secondary | ICD-10-CM | POA: Diagnosis not present

## 2018-03-23 MED ORDER — TIZANIDINE HCL 4 MG PO TABS: 4.0000 mg | ORAL_TABLET | Freq: Four times a day (QID) | ORAL | 0 refills | Status: DC | PRN

## 2018-03-23 MED ORDER — IBUPROFEN 800 MG PO TABS: 800.0000 mg | ORAL_TABLET | Freq: Three times a day (TID) | ORAL | 0 refills | Status: DC
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
## 2018-03-23 NOTE — Discharge Instructions (Signed)
Take ibuprofen 3 times a day with food Take the tizanidine as needed, muscle relaxer The tizanidine is useful at bedtime Rest over the weekend Use ice or heat on painful muscles Gentle stretching is a good idea See your doctor if not improved by next week
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
## 2018-03-27 ENCOUNTER — Ambulatory Visit (HOSPITAL_COMMUNITY)
Admission: EM | Admit: 2018-03-27 | Discharge: 2018-03-27 | Disposition: A | Discharge status: Home / Self Care | Payer: BLUE CROSS/BLUE SHIELD

## 2018-03-27 DIAGNOSIS — Z950 Presence of cardiac pacemaker: Secondary | ICD-10-CM | POA: Insufficient documentation

## 2018-03-27 DIAGNOSIS — I519 Heart disease, unspecified: Secondary | ICD-10-CM | POA: Insufficient documentation

## 2018-03-27 HISTORY — DX: Presence of cardiac pacemaker: Z95.0

## 2018-03-27 NOTE — ED Notes (Signed)
Per Steward DroneBrenda, EMT, [facility].  Pt was informed we are not a PCP and we don't fill out FMLA.  Pt was going to go try to see her cardiologist.

## 2018-04-14 ENCOUNTER — Other Ambulatory Visit: Payer: BLUE CROSS/BLUE SHIELD | Admitting: *Deleted

## 2018-04-14 DIAGNOSIS — Z01818 Encounter for other preprocedural examination: Secondary | ICD-10-CM

## 2018-04-15 LAB — CBC
HEMOGLOBIN: 15 g/dL (ref 11.1–15.9)
Hematocrit: 43.4 % (ref 34.0–46.6)
MCH: 30.8 pg (ref 26.6–33.0)
MCHC: 34.6 g/dL (ref 31.5–35.7)
MCV: 89 fL (ref 79–97)
Platelets: 241 10*3/uL (ref 150–450)
RBC: 4.87 x10E6/uL (ref 3.77–5.28)
RDW: 11.9 % — AB (ref 12.3–15.4)
WBC: 7.9 10*3/uL (ref 3.4–10.8)

## 2018-04-15 LAB — BASIC METABOLIC PANEL
BUN/Creatinine Ratio: 12 (ref 9–23)
BUN: 9 mg/dL (ref 6–20)
CALCIUM: 9.5 mg/dL (ref 8.7–10.2)
CO2: 22 mmol/L (ref 20–29)
Chloride: 102 mmol/L (ref 96–106)
Creatinine, Ser: 0.78 mg/dL (ref 0.57–1.00)
GFR calc Af Amer: 124 mL/min/{1.73_m2} (ref >59)
GFR calc non Af Amer: 107 mL/min/{1.73_m2} (ref >59)
GLUCOSE: 75 mg/dL (ref 65–99)
POTASSIUM: 4.2 mmol/L (ref 3.5–5.2)
SODIUM: 139 mmol/L (ref 134–144)

## 2018-04-19 ENCOUNTER — Telehealth: Payer: Self-pay | Admitting: *Deleted

## 2018-04-19 NOTE — Telephone Encounter (Signed)
LMOVM OF RESULTS AND CLINIC NUMBER IF ANY QUESTIONS

## 2018-04-21 ENCOUNTER — Ambulatory Visit (HOSPITAL_COMMUNITY)
Admission: RE | Admit: 2018-04-21 | Payer: BLUE CROSS/BLUE SHIELD | Source: Home / Self Care | Admitting: Internal Medicine

## 2018-04-21 ENCOUNTER — Encounter (HOSPITAL_COMMUNITY): Admission: RE | Payer: Self-pay | Source: Home / Self Care

## 2018-04-21 SURGERY — ICD GENERATOR CHANGEOUT

## 2018-05-04 ENCOUNTER — Ambulatory Visit: Payer: BLUE CROSS/BLUE SHIELD

## 2018-05-08 ENCOUNTER — Ambulatory Visit: Payer: BLUE CROSS/BLUE SHIELD | Admitting: Nurse Practitioner

## 2018-05-08 ENCOUNTER — Telehealth: Payer: Self-pay | Admitting: *Deleted

## 2018-05-08 NOTE — Telephone Encounter (Signed)
Patient came into office today for an appointment to have her ICD wound check.  She has never had the procedure done.  She insist that the MD called and spoke to her significant other saying her appontment to have the generator changed was fopr today.  I let her know that these procedures were done at the hospital and not the office.  I then rescheduled the generator change for 05/10/18 at 11am.  She is aware of the following:  Please arrive at [facility] Entrance of [facility] at 9am on 05/10/18 Do not eat or drink after midnight the night prior to the procedure Do not take any medications the morning of the test Plan for one night stay Use scrub as directed. Will need someone to drive you home at discharge

## 2018-05-10 ENCOUNTER — Encounter (HOSPITAL_COMMUNITY): Payer: Self-pay | Admitting: Internal Medicine

## 2018-05-10 ENCOUNTER — Other Ambulatory Visit: Payer: Self-pay

## 2018-05-10 ENCOUNTER — Ambulatory Visit (HOSPITAL_COMMUNITY)
Admission: RE | Disposition: A | Discharge status: Home / Self Care | Payer: Self-pay | Source: Home / Self Care | Attending: Internal Medicine

## 2018-05-10 ENCOUNTER — Ambulatory Visit (HOSPITAL_COMMUNITY)
Admission: RE | Admit: 2018-05-10 | Discharge: 2018-05-10 | Disposition: A | Discharge status: Home / Self Care | Payer: BLUE CROSS/BLUE SHIELD | Attending: Internal Medicine | Admitting: Internal Medicine

## 2018-05-10 DIAGNOSIS — F17200 Nicotine dependence, unspecified, uncomplicated: Secondary | ICD-10-CM | POA: Diagnosis not present

## 2018-05-10 DIAGNOSIS — I2510 Atherosclerotic heart disease of native coronary artery without angina pectoris: Secondary | ICD-10-CM | POA: Insufficient documentation

## 2018-05-10 DIAGNOSIS — Z006 Encounter for examination for normal comparison and control in clinical research program: Secondary | ICD-10-CM | POA: Insufficient documentation

## 2018-05-10 DIAGNOSIS — I252 Old myocardial infarction: Secondary | ICD-10-CM | POA: Insufficient documentation

## 2018-05-10 DIAGNOSIS — I421 Obstructive hypertrophic cardiomyopathy: Secondary | ICD-10-CM | POA: Diagnosis present

## 2018-05-10 DIAGNOSIS — Z8674 Personal history of sudden cardiac arrest: Secondary | ICD-10-CM | POA: Diagnosis not present

## 2018-05-10 DIAGNOSIS — Z4502 Encounter for adjustment and management of automatic implantable cardiac defibrillator: Secondary | ICD-10-CM

## 2018-05-10 HISTORY — PX: ICD GENERATOR CHANGEOUT: EP1231

## 2018-05-10 LAB — SURGICAL PCR SCREEN
MRSA, PCR: NEGATIVE
Staphylococcus aureus: NEGATIVE

## 2018-05-10 LAB — PREGNANCY, URINE: Preg Test, Ur: NEGATIVE

## 2018-05-10 SURGERY — ICD GENERATOR CHANGEOUT

## 2018-05-10 MED ORDER — HEPARIN (PORCINE) IN NACL 1000-0.9 UT/500ML-% IV SOLN
INTRAVENOUS | Status: AC
  Filled 2018-05-10: qty 500

## 2018-05-10 MED ORDER — LIDOCAINE HCL (PF) 1 % IJ SOLN
INTRAMUSCULAR | Status: DC | PRN
  Administered 2018-05-10: 60 mL

## 2018-05-10 MED ORDER — ACETAMINOPHEN 325 MG PO TABS: 325.0000 mg | ORAL_TABLET | ORAL | Status: DC | PRN

## 2018-05-10 MED ORDER — MIDAZOLAM HCL 5 MG/5ML IJ SOLN
INTRAMUSCULAR | Status: AC
  Filled 2018-05-10: qty 5

## 2018-05-10 MED ORDER — CEFAZOLIN SODIUM-DEXTROSE 2-4 GM/100ML-% IV SOLN
INTRAVENOUS | Status: AC
  Filled 2018-05-10: qty 100

## 2018-05-10 MED ORDER — FENTANYL CITRATE (PF) 100 MCG/2ML IJ SOLN
INTRAMUSCULAR | Status: AC
  Filled 2018-05-10: qty 2

## 2018-05-10 MED ORDER — SODIUM CHLORIDE 0.9 % IV SOLN: INTRAVENOUS | Status: AC

## 2018-05-10 MED ORDER — SODIUM CHLORIDE 0.9 % IV SOLN
INTRAVENOUS | Status: AC
  Filled 2018-05-10: qty 2

## 2018-05-10 MED ORDER — LIDOCAINE HCL 1 % IJ SOLN
INTRAMUSCULAR | Status: AC
  Filled 2018-05-10: qty 60

## 2018-05-10 MED ORDER — MUPIROCIN 2 % EX OINT
TOPICAL_OINTMENT | CUTANEOUS | Status: AC
  Administered 2018-05-10: 1 via TOPICAL
  Filled 2018-05-10: qty 22

## 2018-05-10 MED ORDER — ONDANSETRON HCL 4 MG/2ML IJ SOLN: 4.0000 mg | Freq: Four times a day (QID) | INTRAMUSCULAR | Status: DC | PRN

## 2018-05-10 MED ORDER — MIDAZOLAM HCL 5 MG/5ML IJ SOLN
INTRAMUSCULAR | Status: DC | PRN
  Administered 2018-05-10: 2 mg via INTRAVENOUS
  Administered 2018-05-10 (×2): 1 mg via INTRAVENOUS

## 2018-05-10 MED ORDER — CEFAZOLIN SODIUM-DEXTROSE 2-4 GM/100ML-% IV SOLN
2.0000 g | INTRAVENOUS | Status: AC
  Administered 2018-05-10: 2 g via INTRAVENOUS

## 2018-05-10 MED ORDER — CHLORHEXIDINE GLUCONATE 4 % EX LIQD: 60.0000 mL | Freq: Once | CUTANEOUS | Status: DC

## 2018-05-10 MED ORDER — SODIUM CHLORIDE 0.9 % IV SOLN
80.0000 mg | INTRAVENOUS | Status: AC
  Administered 2018-05-10: 80 mg
  Filled 2018-05-10: qty 2

## 2018-05-10 MED ORDER — SODIUM CHLORIDE 0.9 % IV SOLN
INTRAVENOUS | Status: DC
  Administered 2018-05-10: 10:00:00 via INTRAVENOUS

## 2018-05-10 MED ORDER — MUPIROCIN 2 % EX OINT
1.0000 "application " | TOPICAL_OINTMENT | Freq: Once | CUTANEOUS | Status: AC
  Administered 2018-05-10: 1 via TOPICAL
  Filled 2018-05-10: qty 22

## 2018-05-10 MED ORDER — FENTANYL CITRATE (PF) 100 MCG/2ML IJ SOLN
INTRAMUSCULAR | Status: DC | PRN
  Administered 2018-05-10 (×2): 25 ug via INTRAVENOUS
  Administered 2018-05-10: 50 ug via INTRAVENOUS

## 2018-05-10 SURGICAL SUPPLY — 6 items
CABLE SURGICAL S-101-97-12 (CABLE) ×3 IMPLANT
ICD VISIA MRI VR DVFB1D4 (ICD Generator) IMPLANT
KIT LEAD ACCESSORY 6056M PINCH (KITS) ×2 IMPLANT
PAD PRO RADIOLUCENT 2001M-C (PAD) ×3 IMPLANT
TRAY PACEMAKER INSERTION (PACKS) ×3 IMPLANT
VISIA MRI VR DVFB1D4 (ICD Generator) ×3 IMPLANT

## 2018-05-10 NOTE — Progress Notes (Signed)
Orthopedic Tech Progress Note Patient Details:  Sara Lopez 06-Oct-1995 [id number] Went to apply arm sling RN told me she doesn't need one. Patient ID: Sara Lopez, female   DOB: Oct 30, 1995, 23 y.o.   MRN: [id number]   [name] 05/10/2018, 12:59 PM

## 2018-05-10 NOTE — H&P (Signed)
ICD Criteria  Current LVEF:55%. Within 12 months prior to implant: No   Heart failure history: No  Cardiomyopathy history: No.  Atrial Fibrillation/Atrial Flutter: No.  Ventricular tachycardia history: Yes, Hemodynamic instability present. VT Type: Sustained Ventricular Tachycardia - Polymorphic.  Cardiac arrest history: Yes, Ventricular Fibrillation.  History of syndromes with risk of sudden death: Yes, Other  Previous ICD: Yes, Reason for ICD:  Secondary prevention.  Current ICD indication: Secondary  PPM indication: No.  Class I or II Bradycardia indication present: No  Beta Blocker therapy for 3 or more months: No, medical reason.  Ace Inhibitor/ARB therapy for 3 or more months: No, medical reason.   I have seen Sara Lopez is a 23 y.o. femalepre-procedural and has been referred by reimplantation for consideration of ICD implant for secondary prevention of sudden death.  The patient's chart has been reviewed and they meet criteria for ICD implant.  I have had a thorough discussion with the patient reviewing options.  The patient and their family (if available) have had opportunities to ask questions and have them answered. The patient and I have decided together through the [facility] Heart Care Share Decision Support Tool to rreimplant ICD at this time.  Risks, benefits, alternatives to ICD implantation were discussed in detail with the patient today. The patient  understands that the risks include but are not limited to bleeding, infection, pneumothorax, perforation, tamponade, vascular damage, renal failure, MI, stroke, death, inappropriate shocks, and lead dislodgement and   wis2h2s to proceed.       Patient Care Team: Denny Levy as PCP - General (Physician Assistant)   HPI  Sara Lopez is a 23 y.o. woman  with a history of polysubstance abuse who was in detox when she collapsed during exertion. She had ventricular fibrillation apparently and was  successfully defibrillated. She underwent an evaluation. 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    She has a history of recurrent chest pain. Left heart catheterization at [facility] 2014 had demonstrated myocardial bridging with nuclear stress test was negative.    She has been treated in the past with verapamil. Most recently on low-dose beta blockers up titration of which has been limited by bradycardia, blood pressure compliance.  She was seen in the  with a minimal elevation of troponin 5/18.  The patient denies chest pain, shortness of breath, nocturnal dyspnea, orthopnea or peripheral edema.  There have been no palpitations, lightheadedness or syncope.    She has no known family history as she is adopted from [place]  Records and Results Reviewed   Past Medical History:  Diagnosis Date  . ADHD   . Coronary-myocardial bridge    LHC at [facility] 2/14: LAD mid myocardial bridge (patent in diastole and near complete occlusion distally) // Myoview 2/14: Inferior attenuation, no ischemia, normal EF  . Depression   . Development delay   . History of echocardiogram 08/2016   Echo 5/18: severe asymmetric septal hypertrophy, EF 60-65, dynamic obstruction at rest, peak LVOT 328 cm/sec and peak gradient 43 mmHg, no RWMA, septal thickness 24.6 mm, post wall thickness 15.5 mm, +SAM, mild MR, severe LAE, PASP 30  . History of echocardiogram 1.2014 at [facility]   Echo 1/14: Hypertrophic CM, mild dynamic LVOT - peak 27 mmHg, chordal SAM with LVOT obstruction, mild MR, dilate LCA, abnormal LV diastolic function, hyperdynamic LV systolic function   . HOCM (hypertrophic obstructive cardiomyopathy) (HCC)   . ICD (implantable cardioverter-defibrillator)  in place    Medtronic model D334DRM (Protect), serial number [id number], implanted 04/30/2010   . Myocardial infarction (HCC)    hx of elevated Tn levels // LHC in 2014 at [facility] with myocardial bridging  .  VF (ventricular fibrillation) (HCC)    admitted in 2011 with VF arrest >> dx with HOCM // s/p ICD    Past Surgical History:  Procedure Laterality Date  . ICD IMPLANT  2011    Current Facility-Administered Medications  Medication Dose Route Frequency Provider Last Rate Last Dose  . mupirocin ointment (BACTROBAN) 2 %           . 0.9 %  sodium chloride infusion   Intravenous Continuous Duke Salvia, MD      . ceFAZolin (ANCEF) IVPB 2g/100 mL premix  2 g Intravenous On Call Duke Salvia, MD      . chlorhexidine (HIBICLENS) 4 % liquid 4 application  60 mL Topical Once Duke Salvia, MD      . gentamicin (GARAMYCIN) 80 mg in sodium chloride 0.9 % 500 mL irrigation  80 mg Irrigation On Call Duke Salvia, MD      . mupirocin ointment (BACTROBAN) 2 % 1 application  1 application Topical Once Duke Salvia, MD        No Known Allergies    Social History   Tobacco Use  . Smoking status: Current Some Day Smoker  . Smokeless tobacco: Never Used  Substance Use Topics  . Alcohol use: Yes    Alcohol/week: 1.0 standard drinks    Types: 1 Glasses of wine per week    Comment: occasional  . Drug use: No    Types: Marijuana    Comment: no drugs since 2011     Family History  Adopted: Yes     No outpatient medications have been marked as taking for the 05/10/18 encounter [facility] Encounter).     Review of Systems negative except from HPI and PMH  Physical Exam BP 112/73   Pulse 80   Temp 97.6 F (36.4 C) (Skin)   Ht 5\' 6"  (1.676 m)   Wt 79.4 kg   SpO2 98%   BMI 28.25 kg/m  Well developed and well nourished in no acute distress HENT normal E scleral and icterus clear Neck Supple JVP flat; carotids brisk and full Clear to ausculation Device pocket well healed; without hematoma or erythema.  There is no tethering  Regular rate and rhythm, no murmurs gallops or rub Soft with active bowel sounds No clubbing cyanosis } Edema Alert and oriented, grossly normal motor  and sensory function Skin Warm and Dry    Assessment and  Plan HOCM  Cardiac Arrest Resuscitated  ICD - Medtronic  Alcohol use              Hx of polysubstance abuse  For generator replacement We have reviewed the benefits and risks of generator replacement.  These include but are not limited to lead fracture and infection.  The patient understands, agrees and is willing to proceed.

## 2018-05-10 NOTE — Discharge Instructions (Signed)
Dermabond will come off in next 10-14 days; if not will remove at wound check  Keep wound dry until tomorrow evening No Driving x 4 days Wound check in office as scheduled  Discharge in 1 hour if pt stable    Pacemaker Battery Change, Care After This sheet gives you information about how to care for yourself after your procedure. Your health care provider may also give you more specific instructions. If you have problems or questions, contact your health care provider. What can I expect after the procedure? After your procedure, it is common to have:  Pain or soreness at the site where the pacemaker was inserted.  Swelling at the site where the pacemaker was inserted. Follow these instructions at home: Incision care   Keep the incision clean and dry. ? Do not take baths, swim, or use a hot tub until your health care provider approves. ? You may shower the evening after your procedure, or as directed by your health care provider. ? Pat the area dry with a clean towel. Do not rub the area. This may cause bleeding.  Follow instructions from your health care provider about how to take care of your incision. Make sure you: ? Wash your hands with soap and water before you change your bandage (dressing). If soap and water are not available, use hand sanitizer. ? Change your dressing as told by your health care provider. ? Leave stitches (sutures), skin glue, or adhesive strips in place. These skin closures may need to stay in place for 2 weeks or longer. If adhesive strip edges start to loosen and curl up, you may trim the loose edges. Do not remove adhesive strips completely unless your health care provider tells you to do that.  Check your incision area every day for signs of infection. Check for: ? More redness, swelling, or pain. ? More fluid or blood. ? Warmth. ? Pus or a bad smell. Activity  Do not lift anything that is heavier than 10 lb (4.5 kg) until your health care provider  says it is okay to do so.  For the first 2 weeks, or as long as told by your health care provider: ? Avoid lifting your left arm higher than your shoulder. ? Be gentle when you move your arms over your head. It is okay to raise your arm to comb your hair. ? Avoid strenuous exercise.  Ask your health care provider when it is okay to: ? Resume your normal activities. ? Return to work or school. ? Resume sexual activity. Eating and drinking  Eat a heart-healthy diet. This should include plenty of fresh fruits and vegetables, whole grains, low-fat dairy products, and lean protein like chicken and fish.  Limit alcohol intake to no more than 1 drink a day for non-pregnant women and 2 drinks a day for men. One drink equals 12 oz of beer, 5 oz of wine, or 1 oz of hard liquor.  Check ingredients and nutrition facts on packaged foods and beverages. Avoid the following types of food: ? Food that is high in salt (sodium). ? Food that is high in saturated fat, like full-fat dairy or red meat. ? Food that is high in trans fat, like fried food. ? Food and drinks that are high in sugar. Lifestyle  Do not use any products that contain nicotine or tobacco, such as cigarettes and e-cigarettes. If you need help quitting, ask your health care provider.  Take steps to manage and control your weight.  Get regular exercise. Aim for 150 minutes of moderate-intensity exercise (such as walking or yoga) or 75 minutes of vigorous exercise (such as running or swimming) each week.  Manage other health problems, such as diabetes or high blood pressure. Ask your health care provider how you can manage these conditions. General instructions  Do not drive for 4 days after your procedure if you were given a medicine to help you relax (sedative).  Take over-the-counter and prescription medicines only as told by your health care provider.  Avoid putting pressure on the area where the pacemaker was placed.  If you  need an MRI after your pacemaker has been placed, be sure to tell the health care provider who orders the MRI that you have a pacemaker.  Avoid close and prolonged exposure to electrical devices that have strong magnetic fields. These include: ? Cell phones. Avoid keeping them in a pocket near the pacemaker, and try using the ear opposite the pacemaker. ? MP3 players. ? Household appliances, like microwaves. ? Metal detectors. ? Electric generators. ? High-tension wires.  Keep all follow-up visits as directed by your health care provider. This is important. Contact a health care provider if:  You have pain at the incision site that is not relieved by over-the-counter or prescription medicines.  You have any of these around your incision site or coming from it: ? More redness, swelling, or pain. ? Fluid or blood. ? Warmth to the touch. ? Pus or a bad smell.  You have a fever.  You feel brief, occasional palpitations, light-headedness, or any symptoms that you think might be related to your heart. Get help right away if:  You experience chest pain that is different from the pain at the pacemaker site.  You develop a red streak that extends above or below the incision site.  You experience shortness of breath.  You have palpitations or an irregular heartbeat.  You have light-headedness that does not go away quickly.  You faint or have dizzy spells.  Your pulse suddenly drops or increases rapidly and does not return to normal.  You begin to gain weight and your legs and ankles swell. Summary  After your procedure, it is common to have pain, soreness, and some swelling where the pacemaker was inserted.  Make sure to keep your incision clean and dry. Follow instructions from your health care provider about how to take care of your incision.  Check your incision every day for signs of infection, such as more pain or swelling, pus or a bad smell, warmth, or leaking fluid and  blood.  Avoid strenuous exercise and lifting your left arm higher than your shoulder for 2 weeks, or as long as told by your health care provider. This information is not intended to replace advice given to you by your health care provider. Make sure you discuss any questions you have with your health care provider. Document Released: 02/07/2013 Document Revised: 03/11/2016 Document Reviewed: 03/11/2016 Elsevier Interactive Patient Education  2019 [employer].

## 2018-05-11 MED FILL — Heparin Sod (Porcine)-NaCl IV Soln 1000 Unit/500ML-0.9%: INTRAVENOUS | Qty: 500 | Status: AC

## 2018-05-11 MED FILL — Lidocaine HCl Local Inj 1%: INTRAMUSCULAR | Qty: 60 | Status: AC

## 2018-05-18 IMAGING — DX DG CHEST 2V
2 series · 2 of 2 positions shown · non-contrast
Comparison: None.

CLINICAL DATA: Shortness of breath.

EXAM:
CHEST  2 VIEW

[chest pa]
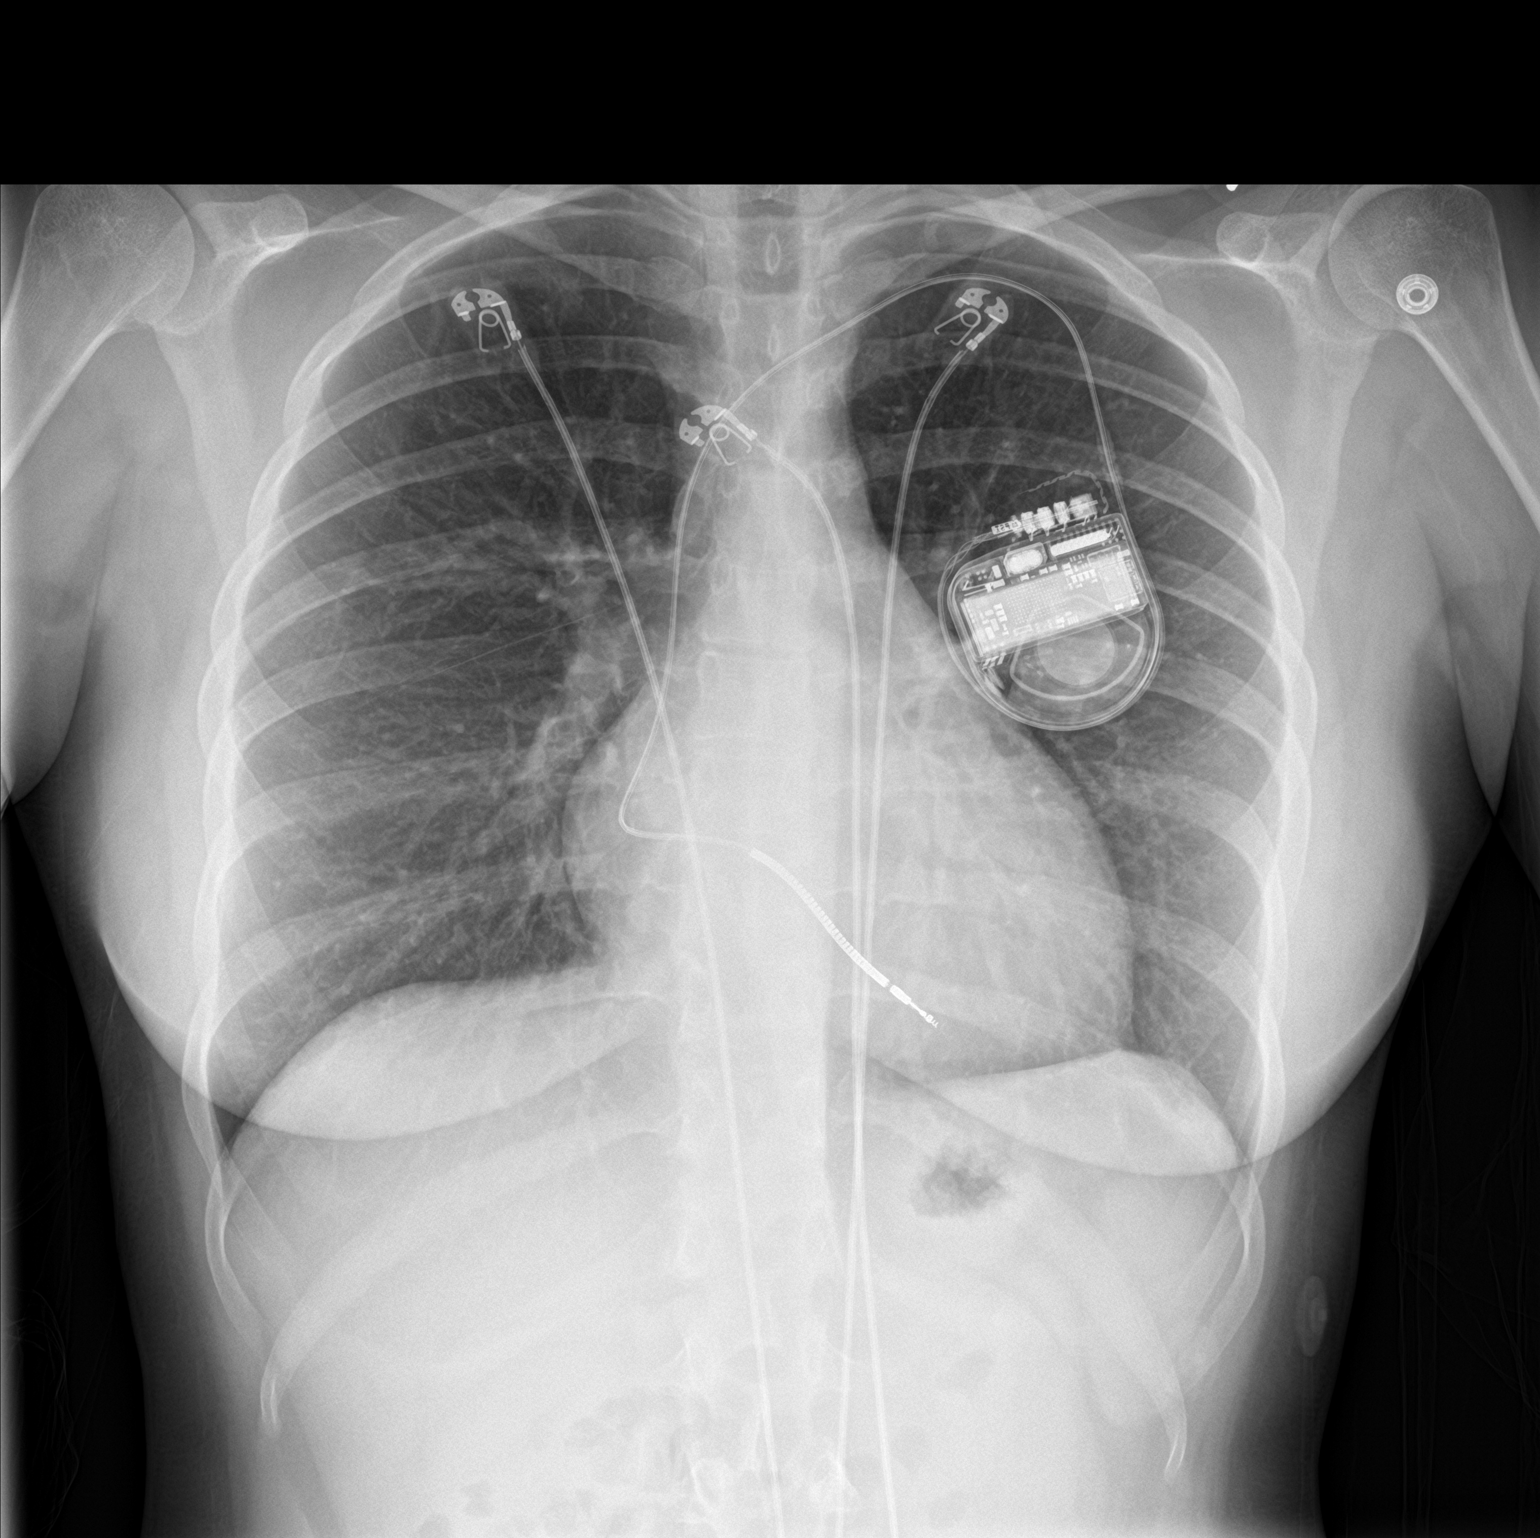

[chest lat]
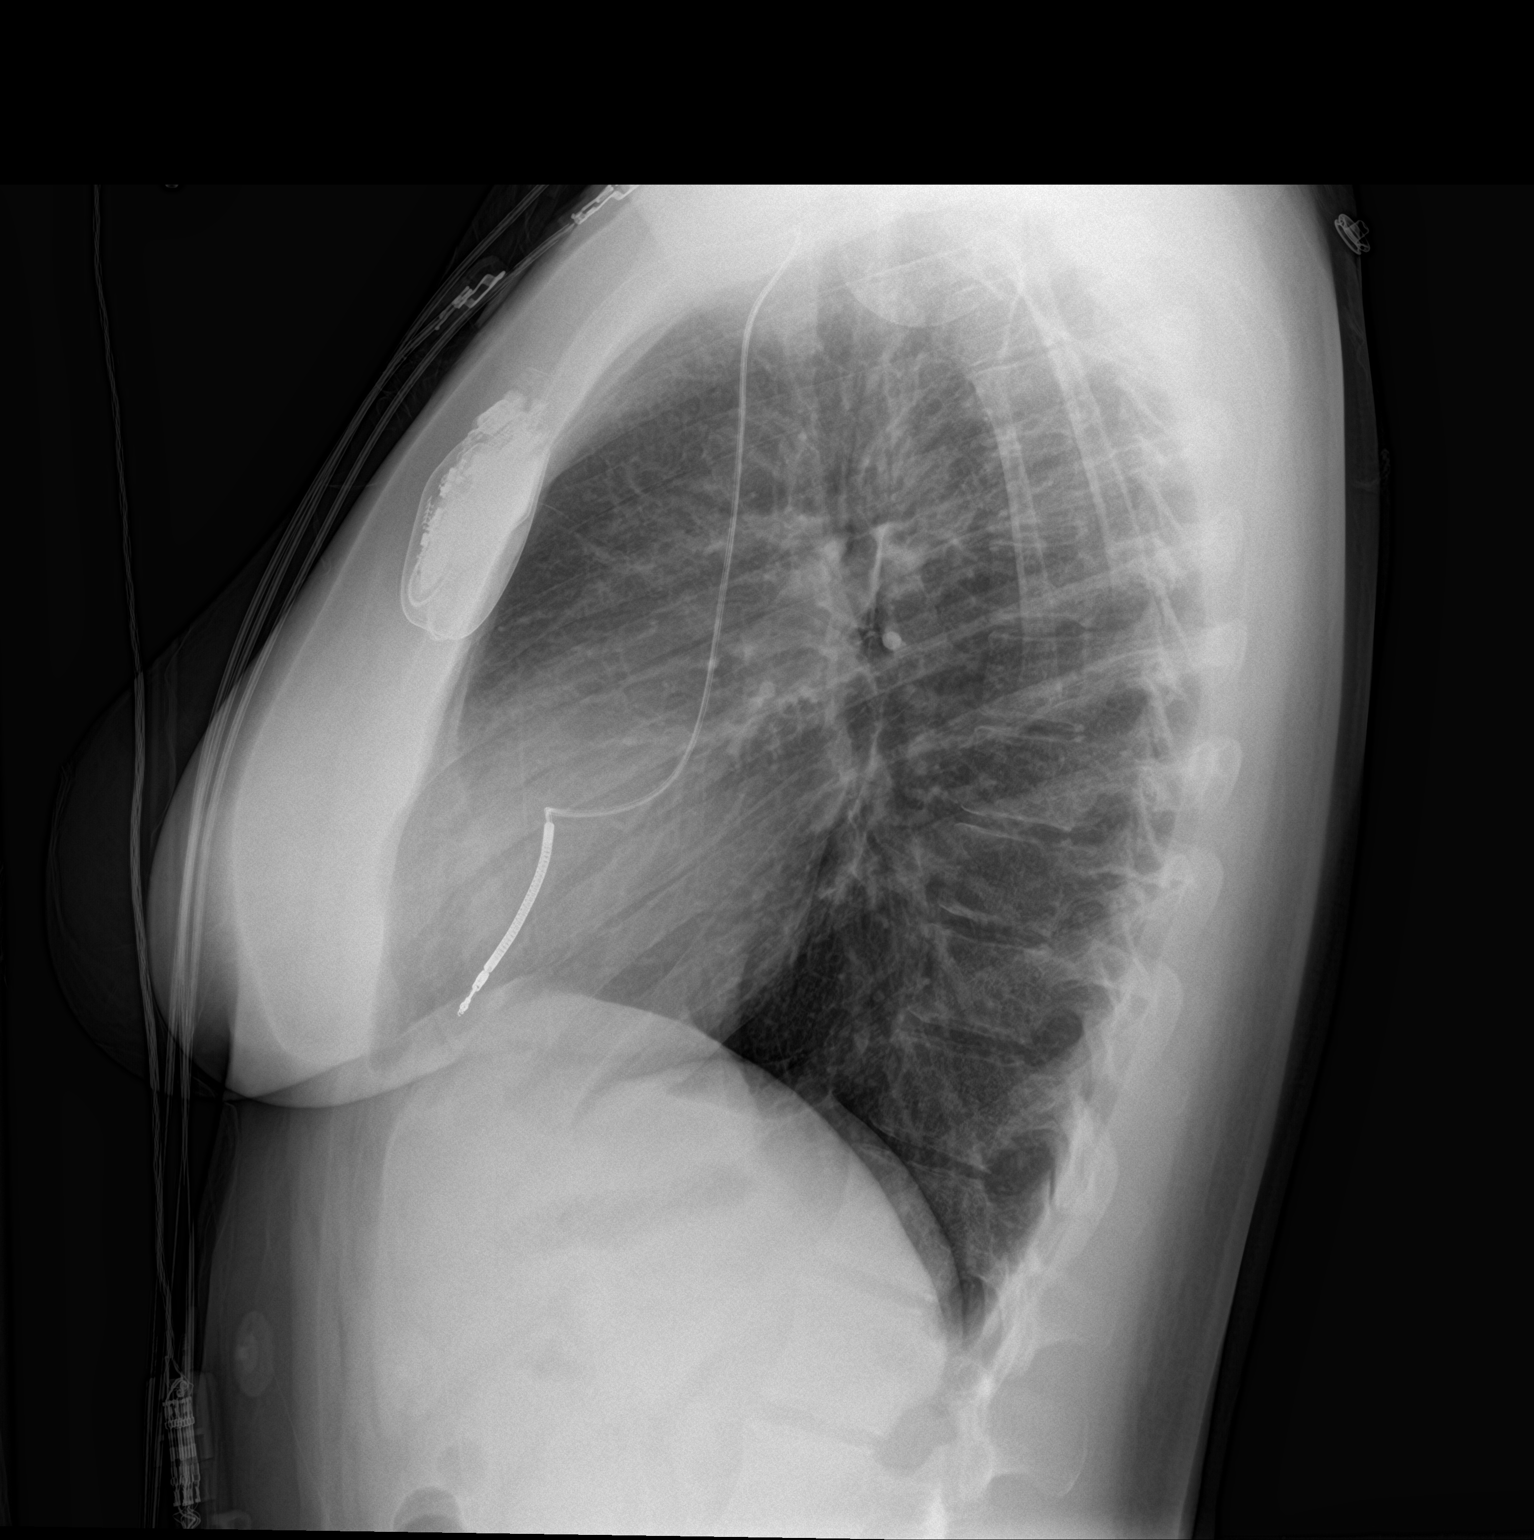

[2 of 2 positions shown; findings below may reference images not displayed]

FINDINGS: The heart size and mediastinal contours are within normal limits.
Both lungs are clear. No pneumothorax or pleural effusion is noted.
Single lead left-sided pacemaker is in grossly good position. The
visualized skeletal structures are unremarkable.
IMPRESSION: No active cardiopulmonary disease.

## 2018-05-22 ENCOUNTER — Ambulatory Visit: Payer: BLUE CROSS/BLUE SHIELD

## 2018-07-26 ENCOUNTER — Telehealth: Payer: Self-pay | Admitting: Internal Medicine

## 2018-07-26 NOTE — Telephone Encounter (Signed)
  Patient lives in area that is going shelter [place] place and she works in [place] and needs to know if she is a high risk case and needs to stay home.

## 2018-07-27 ENCOUNTER — Telehealth: Payer: Self-pay | Admitting: Internal Medicine

## 2018-07-27 NOTE — Telephone Encounter (Signed)
Pt interested and accepted a virtual visit for Monday 3/30 @ 1130am. She understands we will contact her with additional information prior to her appt.

## 2018-07-27 NOTE — Telephone Encounter (Signed)
  Patient is calling back this morning to find out if she is considered high risk or not due to her work needing to know.

## 2018-07-27 NOTE — Telephone Encounter (Signed)
Spoke with pt. She is concerned her heart issues place her at higher risk for COVID complications. If so, she would like Dr Graciela Husbands to fill out FMLA paperwork since her employer is deemed a necessity. I advised her I discussed her situation with Dr Graciela Husbands. At this time, he will not be able to write her out of work since there is no clear evidence as to what would constitute her as higher risk. We would be able to write a letter stating her restrictions, which would be her normal restrictions. She has verbalized understanding and will call with additional needs.

## 2018-07-27 NOTE — Telephone Encounter (Signed)
New Message:   Patient calling concerning that she turns red some time. Patient is not feeling well. Patient would like to get a sooner appt.

## 2018-07-28 ENCOUNTER — Telehealth: Payer: Self-pay

## 2018-07-28 NOTE — Telephone Encounter (Signed)
I called and spoke with patient, she was not at home and states that she will set up mychart when she gets home. I called and left patient a voicemail with mychart activation code per patient request. I will check on Monday to see if she has activated my chart. If she has I will send consent. If not I will call patient.

## 2018-07-31 ENCOUNTER — Telehealth (INDEPENDENT_AMBULATORY_CARE_PROVIDER_SITE_OTHER): Payer: BLUE CROSS/BLUE SHIELD | Admitting: Internal Medicine

## 2018-07-31 VITALS — Ht 66.0 in | Wt 175.0 lb

## 2018-07-31 DIAGNOSIS — I421 Obstructive hypertrophic cardiomyopathy: Secondary | ICD-10-CM

## 2018-07-31 DIAGNOSIS — I469 Cardiac arrest, cause unspecified: Secondary | ICD-10-CM

## 2018-07-31 DIAGNOSIS — Z4502 Encounter for adjustment and management of automatic implantable cardiac defibrillator: Secondary | ICD-10-CM | POA: Diagnosis not present

## 2018-07-31 NOTE — Telephone Encounter (Signed)
Transmission has not been received. called p and LMOVM for pt to send a manual transmission w/ her home monitor.

## 2018-07-31 NOTE — Telephone Encounter (Signed)
Transmission received, will not cross into PaceArt (need to call tech services) - normal device function, no episodes, chronically high RV threshold.   Gypsy Balsam, NP 07/31/2018 11:00 AM

## 2018-07-31 NOTE — Telephone Encounter (Signed)
Patient set up mychart for televisit today.

## 2018-07-31 NOTE — Progress Notes (Signed)
Electrophysiology TeleHealth Note   Due to national recommendations of social distancing due to COVID 19, an audio/video telehealth visit is felt to be most appropriate for this patient at this time.  See MyChart message from today for the patient's consent to telehealth for [facility].   Date:  07/31/2018   ID:  Sara Lopez, DOB 1995-06-26, MRN [id number]  Location: patient's home  Provider location: [address], [place]  Evaluation Performed: Follow-up visit  PCP:  Loletta Specter, PA-C  Cardiologist:  SK Electrophysiologist:  sk   Chief Complaint:  ICD followup   History of Present Illness:    Sara Lopez is a 23 y.o. female who presents via audio/video conferencing for a telehealth visit today.  Since last being seen in our clinic, the patient reports doing very well.  Today, she denies symptoms of palpitations, chest pain, shortness of breath,  lower extremity edema, dizziness, presyncope, or syncope.  The patient is otherwise without complaint today.    Chest tightness difficulty breathing and turns red-- stress and anxious-- never seen on ICD-- duration secs assoc with tachypalps    She has an ICD for aborted cardiac arrest in setting of HOCM  Underwent generator replacement 1/20  The patient denies symptoms of fevers, chills, cough, or new SOB worrisome for COVID 19.    Past Medical History:  Diagnosis Date  . ADHD   . Coronary-myocardial bridge    LHC at [facility] 2/14: LAD mid myocardial bridge (patent in diastole and near complete occlusion distally) // Myoview 2/14: Inferior attenuation, no ischemia, normal EF  . Depression   . Development delay   . History of echocardiogram 08/2016   Echo 5/18: severe asymmetric septal hypertrophy, EF 60-65, dynamic obstruction at rest, peak LVOT 328 cm/sec and peak gradient 43 mmHg, no RWMA, septal thickness 24.6 mm, post wall thickness 15.5 mm, +SAM, mild MR, severe LAE, PASP 30  . History of  echocardiogram 1.2014 at [facility]   Echo 1/14: Hypertrophic CM, mild dynamic LVOT - peak 27 mmHg, chordal SAM with LVOT obstruction, mild MR, dilate LCA, abnormal LV diastolic function, hyperdynamic LV systolic function   . HOCM (hypertrophic obstructive cardiomyopathy) (HCC)   . ICD (implantable cardioverter-defibrillator) in place    Medtronic model D334DRM (Protect), serial number [id number], implanted 04/30/2010   . Myocardial infarction (HCC)    hx of elevated Tn levels // LHC in 2014 at [facility] with myocardial bridging  . VF (ventricular fibrillation) (HCC)    admitted in 2011 with VF arrest >> dx with HOCM // s/p ICD    Past Surgical History:  Procedure Laterality Date  . ICD GENERATOR CHANGEOUT N/A 05/10/2018   Procedure: ICD GENERATOR CHANGEOUT;  Surgeon: Duke Salvia, MD;  Location: [facility] INVASIVE CV LAB;  Service: Cardiovascular;  Laterality: N/A;  . ICD IMPLANT  2011    Current Outpatient Medications  Medication Sig Dispense Refill  . Etonogestrel (IMPLANON [place]) Inject 1 Device into the skin once.    Marland Kitchen ibuprofen (ADVIL,MOTRIN) 200 MG tablet Take 400 mg by mouth every 8 (eight) hours as needed (pain/headaches.).     No current facility-administered medications for this visit.     Allergies:   Patient has no known allergies.   Social History:  The patient  reports that she has been smoking. She has never used smokeless tobacco. She reports current alcohol use of about 1.0 standard drinks of alcohol per week. She reports that she does not use  drugs.   Family History:  The patient's  * family history is not on file. She was adopted.   ROS:  Please see the history of present illness.   All other systems are personally reviewed and negative.    Exam:    Vital Signs:  Ht 5\' 6"  (1.676 m)   Wt 175 lb (79.4 kg)   BMI 28.25 kg/m   P 68   Well appearing, alert and conversant, regular work of breathing,  good skin color Eyes- anicteric, neuro- grossly intact, skin- no apparent rash or  lesions or cyanosis, mouth- oral mucosa is pink Wound  Well healed without hematoma or erythema   Labs/Other Tests and Data Reviewed:    Recent Labs: 04/14/2018: BUN 9; Creatinine, Ser 0.78; Hemoglobin 15.0; Platelets 241; Potassium 4.2; Sodium 139   Wt Readings from Last 3 Encounters:  07/31/18 175 lb (79.4 kg)  05/10/18 175 lb (79.4 kg)  03/21/18 175 lb (79.4 kg)          Last device remote is reviewed from PaceART PDF dated 11/19  which reveals normal device function, no arrhythmias   VF detection is programmed at 231 bpm   ASSESSMENT & PLAN:     HOCM  Cardiac Arrest Resuscitated  ICD - Medtronic  Tachypalpitations   tachypalps could be from VT NS given concomitant symptoms-- we will reprogram the device to monitor 170 bpm       COVID 19 screen The patient denies symptoms of COVID 19 at this time.  The importance of social distancing was discussed today.  Follow-up: as scheduled  Next remote: as scheduled   Current medicines are reviewed at length with the patient today.   The patient does not have concerns regarding her medicines.  The following changes were made today:  none  Labs/ tests ordered today include: none No orders of the defined types were placed in this encounter.   Future tests ( post COVID )     Patient Risk:  after full review of this patients clinical status, I feel that they are at moderate risk at this time.  Today, I have spent 25 minutes with the patient with telehealth technology discussing symptoms and CoVId health .    Signed, Sherryl Manges, MD  07/31/2018 11:38 AM     [facility] [address] [place] [address] [phone] (office) [phone] (fax)

## 2018-07-31 NOTE — Telephone Encounter (Signed)
I called and left patient a message about setting up mychart. I see that it hasn't been done and patients appointment is at 11:30 with Dr. Graciela Husbands. I also asked patient to send remote transmission before appointment.

## 2018-08-01 ENCOUNTER — Encounter: Payer: BLUE CROSS/BLUE SHIELD | Admitting: Internal Medicine

## 2018-08-15 ENCOUNTER — Encounter: Payer: BLUE CROSS/BLUE SHIELD | Admitting: Internal Medicine

## 2018-08-21 ENCOUNTER — Other Ambulatory Visit: Payer: Self-pay

## 2018-08-21 ENCOUNTER — Ambulatory Visit (INDEPENDENT_AMBULATORY_CARE_PROVIDER_SITE_OTHER): Payer: BLUE CROSS/BLUE SHIELD | Admitting: *Deleted

## 2018-08-21 DIAGNOSIS — Z95810 Presence of automatic (implantable) cardiac defibrillator: Secondary | ICD-10-CM | POA: Diagnosis not present

## 2018-08-21 DIAGNOSIS — I421 Obstructive hypertrophic cardiomyopathy: Secondary | ICD-10-CM | POA: Diagnosis not present

## 2018-08-21 LAB — CUP PACEART INCLINIC DEVICE CHECK
Battery Remaining Longevity: 136 mo
Battery Voltage: 3.13 V
Brady Statistic RV Percent Paced: 4.38 %
Date Time Interrogation Session: 20200420095139
HighPow Impedance: 54 Ohm
Implantable Lead Implant Date: 20111229
Implantable Lead Location: 753860
Implantable Lead Model: 7122
Implantable Pulse Generator Implant Date: 20200108
Lead Channel Impedance Value: 760 Ohm
Lead Channel Impedance Value: 893 Ohm
Lead Channel Pacing Threshold Amplitude: 1.75 V
Lead Channel Pacing Threshold Pulse Width: 1 ms
Lead Channel Sensing Intrinsic Amplitude: 20.875 mV
Lead Channel Setting Pacing Amplitude: 2.75 V
Lead Channel Setting Pacing Pulse Width: 1 ms
Lead Channel Setting Sensing Sensitivity: 0.6 mV

## 2018-08-21 NOTE — Progress Notes (Signed)
ICD check in clinic. Normal device function. Thresholds and sensing consistent with  previous device measurements, RV threshold chronically elevated. Impedance trends stable over time. No evidence of any ventricular arrhythmias. No AF episodes Histogram distribution appropriate for patient and level of activity. Per SK VT monitor zone lowered to 171 bpm. Device programmed at appropriate safety margins. Device programmed to optimize intrinsic conduction. Estimated longevity _11.3 yrs__. Pt enrolled in remote follow-up. Plan to check device every 3 months remotely and in office annually. Patient education completed including shock plan. Alert tones demonstrated for patient. Carelink on  11/20/2018 and ROV with SK in 05/2019.

## 2018-11-20 ENCOUNTER — Ambulatory Visit (INDEPENDENT_AMBULATORY_CARE_PROVIDER_SITE_OTHER): Payer: BLUE CROSS/BLUE SHIELD | Admitting: *Deleted

## 2018-11-20 DIAGNOSIS — I469 Cardiac arrest, cause unspecified: Secondary | ICD-10-CM

## 2018-11-21 LAB — CUP PACEART REMOTE DEVICE CHECK
Date Time Interrogation Session: 20200721051603
Implantable Lead Implant Date: 20111229
Implantable Lead Location: 753860
Implantable Lead Model: 7122
Implantable Pulse Generator Implant Date: 20200108

## 2018-12-06 ENCOUNTER — Encounter: Payer: Self-pay | Admitting: Cardiology

## 2018-12-06 NOTE — Progress Notes (Signed)
Remote ICD transmission.   

## 2019-02-19 ENCOUNTER — Ambulatory Visit (INDEPENDENT_AMBULATORY_CARE_PROVIDER_SITE_OTHER): Payer: Self-pay | Admitting: *Deleted

## 2019-02-19 DIAGNOSIS — Z8674 Personal history of sudden cardiac arrest: Secondary | ICD-10-CM

## 2019-02-19 DIAGNOSIS — I421 Obstructive hypertrophic cardiomyopathy: Secondary | ICD-10-CM

## 2019-02-20 LAB — CUP PACEART REMOTE DEVICE CHECK
Date Time Interrogation Session: 20201020204818
Implantable Lead Implant Date: 20111229
Implantable Lead Location: 753860
Implantable Lead Model: 7122
Implantable Pulse Generator Implant Date: 20200108

## 2019-03-13 NOTE — Progress Notes (Signed)
Remote ICD transmission.   

## 2019-05-21 ENCOUNTER — Ambulatory Visit (INDEPENDENT_AMBULATORY_CARE_PROVIDER_SITE_OTHER): Payer: Self-pay | Admitting: *Deleted

## 2019-05-21 DIAGNOSIS — I421 Obstructive hypertrophic cardiomyopathy: Secondary | ICD-10-CM

## 2019-05-22 LAB — CUP PACEART REMOTE DEVICE CHECK
Date Time Interrogation Session: 20210119062149
Implantable Lead Implant Date: 20111229
Implantable Lead Location: 753860
Implantable Lead Model: 7122
Implantable Pulse Generator Implant Date: 20200108

## 2019-08-20 ENCOUNTER — Ambulatory Visit (INDEPENDENT_AMBULATORY_CARE_PROVIDER_SITE_OTHER): Payer: Self-pay | Admitting: *Deleted

## 2019-08-20 DIAGNOSIS — I421 Obstructive hypertrophic cardiomyopathy: Secondary | ICD-10-CM

## 2019-08-20 LAB — CUP PACEART REMOTE DEVICE CHECK
Battery Remaining Longevity: 130 mo
Battery Voltage: 3.03 V
Brady Statistic RV Percent Paced: 5.65 %
Date Time Interrogation Session: 20210419043723
HighPow Impedance: 68 Ohm
Implantable Lead Implant Date: 20111229
Implantable Lead Location: 753860
Implantable Lead Model: 7122
Implantable Pulse Generator Implant Date: 20200108
Lead Channel Impedance Value: 1083 Ohm
Lead Channel Impedance Value: 893 Ohm
Lead Channel Pacing Threshold Amplitude: 2.5 V
Lead Channel Pacing Threshold Pulse Width: 0.4 ms
Lead Channel Sensing Intrinsic Amplitude: 18.875 mV
Lead Channel Sensing Intrinsic Amplitude: 18.875 mV
Lead Channel Setting Pacing Amplitude: 2.75 V
Lead Channel Setting Pacing Pulse Width: 1 ms
Lead Channel Setting Sensing Sensitivity: 0.6 mV

## 2019-08-21 NOTE — Progress Notes (Signed)
ICD Remote  

## 2020-05-19 ENCOUNTER — Ambulatory Visit (INDEPENDENT_AMBULATORY_CARE_PROVIDER_SITE_OTHER): Payer: Self-pay

## 2020-05-19 DIAGNOSIS — I421 Obstructive hypertrophic cardiomyopathy: Secondary | ICD-10-CM

## 2020-05-21 LAB — CUP PACEART REMOTE DEVICE CHECK
Battery Remaining Longevity: 123 mo
Battery Voltage: 3.03 V
Brady Statistic RV Percent Paced: 3.97 %
Date Time Interrogation Session: 20220117033423
HighPow Impedance: 58 Ohm
Implantable Lead Implant Date: 20111229
Implantable Lead Location: 753860
Implantable Lead Model: 7122
Implantable Pulse Generator Implant Date: 20200108
Lead Channel Impedance Value: 1026 Ohm
Lead Channel Impedance Value: 950 Ohm
Lead Channel Pacing Threshold Amplitude: 2.5 V
Lead Channel Pacing Threshold Pulse Width: 0.4 ms
Lead Channel Sensing Intrinsic Amplitude: 18.375 mV
Lead Channel Sensing Intrinsic Amplitude: 18.375 mV
Lead Channel Setting Pacing Amplitude: 2.75 V
Lead Channel Setting Pacing Pulse Width: 1 ms
Lead Channel Setting Sensing Sensitivity: 0.6 mV

## 2020-06-02 NOTE — Progress Notes (Signed)
Remote ICD transmission.   

## 2020-08-18 ENCOUNTER — Ambulatory Visit (INDEPENDENT_AMBULATORY_CARE_PROVIDER_SITE_OTHER): Payer: Self-pay

## 2020-08-18 DIAGNOSIS — I469 Cardiac arrest, cause unspecified: Secondary | ICD-10-CM

## 2020-08-20 LAB — CUP PACEART REMOTE DEVICE CHECK
Battery Remaining Longevity: 121 mo
Battery Voltage: 3.03 V
Brady Statistic RV Percent Paced: 5.57 %
Date Time Interrogation Session: 20220418022703
HighPow Impedance: 60 Ohm
Implantable Lead Implant Date: 20111229
Implantable Lead Location: 753860
Implantable Lead Model: 7122
Implantable Pulse Generator Implant Date: 20200108
Lead Channel Impedance Value: 1045 Ohm
Lead Channel Impedance Value: 912 Ohm
Lead Channel Pacing Threshold Amplitude: 2.5 V
Lead Channel Pacing Threshold Pulse Width: 0.4 ms
Lead Channel Sensing Intrinsic Amplitude: 17.875 mV
Lead Channel Sensing Intrinsic Amplitude: 17.875 mV
Lead Channel Setting Pacing Amplitude: 2.75 V
Lead Channel Setting Pacing Pulse Width: 1 ms
Lead Channel Setting Sensing Sensitivity: 0.6 mV

## 2020-09-02 NOTE — Progress Notes (Signed)
Remote ICD transmission.   

## 2020-09-15 ENCOUNTER — Ambulatory Visit (INDEPENDENT_AMBULATORY_CARE_PROVIDER_SITE_OTHER): Payer: Self-pay

## 2020-09-15 ENCOUNTER — Other Ambulatory Visit: Payer: Self-pay

## 2020-09-15 VITALS — BP 119/76 | HR 91 | Wt 164.3 lb

## 2020-09-15 DIAGNOSIS — Z3201 Encounter for pregnancy test, result positive: Secondary | ICD-10-CM

## 2020-09-15 LAB — POCT PREGNANCY, URINE: Preg Test, Ur: POSITIVE — AB

## 2020-09-15 NOTE — Patient Instructions (Signed)
Prenatal Care Providers           Center for [facility] @ [facility]  [address] [phone]  Center for [facility] @ [facility]   [address]  [phone]  [facility] @ [facility]       [address] [phone]     Center for [facility]  [address] [phone]

## 2020-09-15 NOTE — Progress Notes (Signed)
Pt here today for UPT. UPT in office today is positive. Pt denies any vaginal bleeding, clots, abd pain or cramps.   LMP 07/30/20 EDD 05/06/2021 6w 5d.   Pt aware of results. Pt requesting pregnancy verification letter. Pt given copy of letter. Pt advised to start taking PNV.   Pt spoke with Dr Debroah Loop as new OB. Pt advised to follow up with Cardiology. Pt given list of safe medications list and NEW OB providers near her home. Pt verbalized understanding.   Judeth Cornfield, RN  09/15/20

## 2020-10-28 ENCOUNTER — Ambulatory Visit (INDEPENDENT_AMBULATORY_CARE_PROVIDER_SITE_OTHER): Payer: Medicaid Other

## 2020-10-28 DIAGNOSIS — Z3A00 Weeks of gestation of pregnancy not specified: Secondary | ICD-10-CM

## 2020-10-28 DIAGNOSIS — I519 Heart disease, unspecified: Secondary | ICD-10-CM

## 2020-10-28 DIAGNOSIS — O0990 Supervision of high risk pregnancy, unspecified, unspecified trimester: Secondary | ICD-10-CM | POA: Insufficient documentation

## 2020-10-28 DIAGNOSIS — O99419 Diseases of the circulatory system complicating pregnancy, unspecified trimester: Secondary | ICD-10-CM

## 2020-10-28 MED ORDER — BLOOD PRESSURE MONITORING DEVI: 1.0000 | 0 refills | Status: DC

## 2020-10-28 NOTE — Patient Instructions (Signed)
AREA PEDIATRIC/FAMILY PRACTICE PHYSICIANS  Central/[place]outheast [place] ([address]) [facility] Chambliss, MD; Eniola, MD; Hale, MD; Hensel, MD; McDiarmid, MD; McIntyer, MD; Bemnet Trovato, MD; Walden, MD [address] [place], [place] [address] [phone] Mon-Fri 8:30-12:30, 1:30-5:00 Providers come to see babies at [facility] Accepting Medicaid [facility] providers who accept newborns: Koirala, MD; Morrow, MD; Wolters, MD [address], [place], [place] [address] [phone] Mon-Fri 8:00-5:30 Babies seen by providers at [facility] Does NOT accept Medicaid Please call early in hospitalization for appointment (limited availability)  [facility] Mulberry, MD [address] [place], [place] [address] [phone] Mon, Tue, Thur, Fri 8:30-5:00, Wed 10:00-7:00 (closed 1-2pm) Babies seen by [facility] providers Accepting Medicaid Rubin - Pediatrician Rubin, MD [address], [place], [place] [address] [phone] Mon-Fri 8:30-5:00, Sat 8:30-12:00 Provider comes to see babies at [facility] Accepting Medicaid Must have been referred from current patients or contacted office prior to delivery [facility] ([facility]) Brown, MD; Chandler, MD; Ettefagh, MD; Grant, MD; Lester, MD; McCormick, MD; McQueen, MD; Prose, MD; Simha, MD; Stanley, MD; Stryffeler, NP; Tebben, NP [address], [place], [place] [address] [phone] Mon, Tue, Thur, Fri 8:30-5:30, Wed 9:30-5:30, Sat 8:30-12:30 Babies seen by [facility] providers Accepting Medicaid Only accepting infants of first-time parents or siblings of current patients Hospital discharge coordinator will make follow-up appointment Jack Amos [address], [place], [place]  [address] [phone]   Fax - [phone] [facility] [address], [place], [place]  [address] Phone - [phone]   Fax - [phone] Shilpa Gosrani [address], [place], [place]  [address] [phone]  [place] ([address]) [facility] Bates, MD; Brassfield, MD; Cooper, Cox, MD; MD; Davis, MD; Dovico, MD; Ettefaugh, MD; Little, MD; Lowe, MD; Keiffer, MD; Melvin, MD; Sumner, MD; Williams, MD [address], [place], [place] [address] [phone] Mon-Fri 8:30-5:00 (extended evenings Mon-Thur as needed), Sat-Sun 10:00-1:00 Providers come to see babies at [facility] Accepting Medicaid for families of first-time babies and families with all children in the household age 3 and under. Must register with office prior to making appointment (M-F only). [facility] Henson, NP; Knapp, MD; Lalonde, MD; Tysinger, PA [address] [place], [place] [address] [phone] Mon-Fri 8:00-5:00 Babies seen by providers at [facility] Does NOT accept Medicaid/Commercial Insurance Only [facility] ([facility])  Artis, MD; Barnes, MD; Bratton, MD; Coccaro, MD; Lockett Gardner, MD; Kramer, MD; Marshall, MD; Netherton, MD; Poleto, MD; Skinner, MD [address] [place], [place] [address] [phone] Mon-Fri 8:30-5:30, Sat (Oct.-Mar.) 9:00-1:00 Babies seen by providers at [facility] Accepting Medicaid  [place] ([address]) [facility] Reid, MD; Warner, MD [address], [place], [place] [address] [phone] Mon-Fri 8:30-5:00, Sat 8:30-12:00 Providers come to see babies at [facility] Does NOT accept Medicaid [facility] Becker, PA; Hagler, MD; Scifres, PA; Sun, MD; Swayne, MD [address], [place], [place] [address] [phone] Mon-Fri 8:00-5:00 Babies seen by providers at [facility] Does NOT accept Medicaid Only accepting babies of parents who  are patients Please call early in hospitalization for appointment (limited availability)  Pediatricians Clark, MD; Frye, MD; Kelleher, MD; Mack, NP; [place], MD; O'Keller, MD; Patterson, NP; Pudlo, MD; Puzio, MD; Thomas, MD; Tucker, MD; Twiselton, MD [address], [place], [place] [address] [phone] Mon-Fri 8:00-5:00, Sat 9:00-12:00 Providers come to see babies at [facility] Does NOT accept Medicaid  [place] ([address]) [facility] providers accepting new patients: Brake, NP; Wharton, PA [address], [place], [place] [address] [phone] Mon-Fri 8:00-5:00 Babies seen by providers at [facility] Does NOT accept Medicaid Only accepting babies of parents who are patients Please call early in hospitalization for appointment (limited availability) [facility] Gay, MD; Quinlan, MD [address] [place], [place] [address] [phone] (press [phone] to schedule appointment) Mon-Fri 8:00-5:00 Providers come to see babies at [facility] Does NOT accept Medicaid [facility] Mazer, MD [address] [place], [place] [address] [phone] Mon-Fri 8:30-5:00 (lunch 12:30-1:00), extended hours by appointment only Wed 5:00-6:30 Babies seen by [facility] providers Accepting Medicaid [facility] Banks, MD; Jordan, MD; Koberlein, MD [address], [place], [place] [address] [phone] Mon-Fri 8:00-5:00 Babies seen by [facility] providers Does NOT accept Medicaid [facility] Parker, MD; Hunter, MD; Wallace, DO [address] [place], [place] [address] [phone] Mon-Fri 8:00-5:00 Babies seen by [facility] providers Does NOT accept Medicaid [facility] Brandon, PA; Brecken, PA; Christy, NP; Dees, MD; DeClaire, MD; DeWeese, MD; Hansen, NP; [place], NP; Parrish, NP; Smoot, NP; Summer, MD; Vapne,  MD [address] [place], [place] [address] [phone] Mon-Fri 8:30-5:00, Sat 10:00-1:00 Providers come to see babies at [facility] Does NOT accept Medicaid Free prenatal information session Tuesdays at 4:45pm [facility] Bouska, MD; Gordon, PA; Jeffery, PA; Weber, PA [address] [place] [address] [phone] Mon-Fri 7:30-5:30 Babies seen by [facility] providers [facility] [address], [place], [place]  [address] [phone]   Fax - [phone]  [place] ([address] & [address]) [facility] [place] [address] [place], [place] [address] [phone] Mon-Thur 8:00-6:00 Providers come to see babies at [facility] Accepting Medicaid [facility] Anderson, NP; Badger, MD; Beal, PA; Spencer, PA [address] [place], [place] [address] [phone] Mon-Thur 7:30-7:30, Fri 7:30-4:30 Babies seen by [facility] providers Accepting Medicaid [facility] Agbuya, MD; Klett, NP; Romgoolam, MD [address], [place], [place] [address] [phone] Mon-Fri 8:30-5:00, Sat 8:30-12:00 Providers come to see babies at [facility] Accepting Medicaid Must have "Meet & Greet" appointment at office prior to delivery [facility] ([facility]) McCord, MD; Wallace, MD; Wood, MD [address], [place], [place] [address] [phone] Mon-Wed 8:00-6:00, Thur-Fri 8:00-5:00, Sat 9:00-12:00 Providers come to see babies at [facility] Does NOT accept Medicaid Only accepting siblings of current patients [facility]  [address], [place], [place]  [address] [phone]   Fax - [phone] [facility] [address], [place], [place]  [address] [phone]   Fax -  [phone]  [place] ([address] & [address]) [facility] Cirigliano, DO; Matthews, DO [address] [place], [place] [address] [phone] Mon-Fri 7:00-5:00 Babies seen by [facility] providers Does NOT accept Medicaid [facility] Briscoe, MD; Howley, PA; Moreira, PA [address], [place],  [address] [phone] Mon-Fri 8:00-5:00 Babies seen by [facility] providers Accepting Medicaid [facility] Boyd, MD; Church, PA; Jones, NP; Osborn, PA [address], ,  [address] [phone] Mon-Fri 8:00-5:00 Babies seen by providers at [facility] Accepting Medicaid  [place] ([address]) [facility] [place] Wendling, DO [address] [place], [place] [address] [phone] Mon-Fri 8:00-5:00 Babies seen by [facility] providers Does NOT accept Medicaid Limited availability, please call early in hospitalization to schedule follow-up [facility] Calderon, PA; Cummings, MD; Dillard, MD; Martin, PA; Olson, MD; VanDeven, PA [address], [place], [place] [address] [phone] Mon-Fri 8:30-5:00, Sat 9:00-12:00 Babies seen by providers at [facility] Accepting Medicaid Please register online then schedule online or call office [email or web address] Hunter, NP; Kumar, MD; Martin Rogers, PA [address], [place], [place] [address] [phone] Mon-Fri 8:00-5:00 Babies seen by providers at [facility] Accepting Medicaid [facility] ([facility]) Hurt, MD; Kristi Fleenor, NP; West, MD [address], [place], [place] [address] [phone] Mon-Fri 8:00-5:30, Sat&Sun by appointment (phones open at  8:30) Babies seen by [facility] providers Accepting Medicaid Must be a first-time baby or sibling of current patient [facility] [place]  [address], [place], [place]  [address] [phone]   Fax - [phone]  [place] ([address]) [facility] Brown, PA; Cowen, PA; Rice, MD; Helton, PA; Spry, MD [address] [place], [place] [address] [phone] Mon-Thur 8:00-7:00, Fri 8:00-5:00, Sat 8:00-12:00, Sun 9:00-12:00 Babies seen by [facility] providers Accepting Medicaid [facility] Coe-Goins, MD; Marshall, MD; Pierre-Louis, MD [address], [place], [place] [address] [phone] Mon-Thur 8:00-5:00 Babies seen by providers at [facility] Accepting Medicaid [facility] Bratton, MD; Coe-Goins, MD; Hayes, MD; Lewis, MD; List, MD; Lott, MD; Marshall, MD; Moran, MD; O'Oliver Heitzenrater, MD; Pierre-Louis, MD; Pitonzo, MD; Scholer, MD; Spangle, MD [address] [place], [place] [address] [phone] Mon-Fri 8:00-5:30, Sat (Oct.-Mar.) 9:00-1:00 Babies seen by providers at [facility] Accepting Medicaid Must fill out new patient packet, available online at [email or web address] [facility] ([facility]) Friddle, NP; Harris, NP; Kelly, NP; Logan, MD; Melvin, PA; Poth, MD; Ramadoss, MD; Stanton, NP [address], [place], [place] [address] [phone] Mon-Thur 8:00-5:30, Fri 8:00-5:00 Babies seen by providers at [facility] Accepting Medicaid  [place] ([address]) [facility] Dixon, PA; Bentley, MD; Pickard, MD; Tapia, PA [address], [place], [place] [address] [phone] Mon-Fri 8:00-5:00 Babies seen by providers at [facility] Accepting Medicaid   [place] ([address]) [facility] [place] Masneri, DO; Meyers, MD; Nelson, PA [address], [place], [place] [address] [phone] Mon-Fri 8:00-5:00 Babies seen by providers at [facility] Does NOT accept Medicaid Limited appointment availability, please call early in hospitalization  [facility] [place] Kunedd, DO; McGowen, MD [address], [place], [place] [address] [phone] Mon-Fri 8:00-5:00 Babies seen by [facility] providers Does NOT accept Medicaid [facility] [place] Cameron, MD; MacDonald, MD; Michaels, PA; Nayak, MD [address] [place] [address], [place], [place] [address] [phone] Mon-Fri 8:00-5:00 After hours clinic ([address] [place],  [address]) [phone] Mon-Fri 5:00-8:00, Sat 12:00-6:00, Sun 10:00-4:00 Babies seen by [facility] providers Accepting Medicaid [facility] [place] [address], [place], [place]  [address] [phone]   Fax - [phone]  [place] ([address]) [facility] [place] [facility] Andy, MD [address], [place], [place] [address] [phone] Mon-Fri 8:00-5:00 Babies seen by [facility] providers Does NOT accept Medicaid [facility] [place] ([facility] [place]) Eksir, MD [address], [place], [place] [address] [phone] Mon-Thur 8:00-7:00, Fri 8:00-5:00, Sat 8:00-12:00 Babies seen by providers at [facility] Accepting Medicaid - but does not have vaccinations in office (must be received elsewhere) Limited availability, please call early in hospitalization  [place] ([address]) [facility]  Charlene Flemming, MD [address], [place] [address] [phone]  Fax [phone]  

## 2020-10-28 NOTE — Progress Notes (Signed)
New OB Intake  I connected with  Sara Lopez on 10/28/20 at  3:15 PM EDT by telephone Video Visit and verified that I am speaking with the correct person using two identifiers. Nurse is located at [facility] and pt is located at home.  I discussed the limitations, risks, security and privacy concerns of performing an evaluation and management service by telephone and the availability of in person appointments. I also discussed with the patient that there may be a patient responsible charge related to this service. The patient expressed understanding and agreed to proceed.  I explained I am completing New OB Intake today. We discussed her EDD of 05/06/21 that is based on LMP of 07/30/20. Pt is G1/P0. I reviewed her allergies, medications, Medical/Surgical/OB history, and appropriate screenings. I informed her of [facility] services. Based on history, this is a/an  pregnancy complicated by Heart Disease  .   Patient Active Problem List   Diagnosis Date Noted   Cardiac pacemaker in situ 03/27/2018   Heart disease 03/27/2018   HOCM (hypertrophic obstructive cardiomyopathy) (HCC) 09/21/2016   History of cardiac arrest 09/21/2016   Coronary-myocardial bridge    History of MI (myocardial infarction) 09/16/2016   ADHD (attention deficit hyperactivity disorder) 07/21/2011   Depression 07/21/2011   Developmental delay 07/21/2011   VF (ventricular fibrillation) (HCC) 07/21/2011   ICD (implantable cardiac defibrillator), single, in situ 07/21/2011    Concerns addressed today  Delivery Plans:  Plans to deliver at [facility].   MyChart/Babyscripts MyChart access verified. I explained pt will have some visits in office and some virtually. Babyscripts instructions given and order placed. Patient verifies receipt of registration text/e-mail. Account successfully created and app downloaded.  Blood Pressure Cuff  Blood pressure cuff ordered for patient to pick-up from [employer]. Explained after first  prenatal appt pt will check weekly and document in Babyscripts.  Weight scale: Patient    have weight scale. Weight scale ordered   Anatomy US Explained first scheduled [place] will be around 19 weeks. Anatomy US scheduled for 12/10/20 at 08:45. Pt notified to arrive at 08:30a.  Labs Discussed Avelina Laine genetic screening with patient. Would like both Panorama and Horizon drawn at new OB visit. Routine prenatal labs needed.  Covid Vaccine Patient has not covid vaccine.   Mother/ Baby Dyad Candidate?    If yes, offer as possibility  Inform patient of [facility] . In AVS   Social Determinants of Health Food Insecurity: Patient denies food insecurity. WIC Referral: Patient is interested in referral to [facility].  Transportation: Patient denies transportation needs. Childcare: Discussed no children allowed at ultrasound appointments. Offered childcare services; patient declines childcare services at this time.  First visit review I reviewed new OB appt with pt. I explained she will have a pelvic exam, ob bloodwork with genetic screening, and PAP smear. Explained pt will be seen by Vonzella Nipple, C-PA at first visit; encounter routed to appropriate provider. Explained that patient will be seen by pregnancy navigator following visit with provider. [facility] information placed in AVS.   Henrietta Dine, CMA 10/28/2020  3:23 PM

## 2020-11-07 ENCOUNTER — Encounter: Payer: Self-pay | Admitting: Medical

## 2020-11-07 ENCOUNTER — Other Ambulatory Visit (HOSPITAL_COMMUNITY)
Admission: RE | Admit: 2020-11-07 | Discharge: 2020-11-07 | Disposition: A | Discharge status: Home / Self Care | Payer: Medicaid Other | Source: Ambulatory Visit | Attending: Medical | Admitting: Medical

## 2020-11-07 ENCOUNTER — Other Ambulatory Visit: Payer: Self-pay

## 2020-11-07 ENCOUNTER — Ambulatory Visit (INDEPENDENT_AMBULATORY_CARE_PROVIDER_SITE_OTHER): Payer: Medicaid Other | Admitting: Family Medicine

## 2020-11-07 VITALS — BP 103/67 | HR 62 | Wt 165.0 lb

## 2020-11-07 DIAGNOSIS — O0990 Supervision of high risk pregnancy, unspecified, unspecified trimester: Secondary | ICD-10-CM | POA: Diagnosis not present

## 2020-11-07 DIAGNOSIS — F909 Attention-deficit hyperactivity disorder, unspecified type: Secondary | ICD-10-CM

## 2020-11-07 DIAGNOSIS — Z3482 Encounter for supervision of other normal pregnancy, second trimester: Secondary | ICD-10-CM | POA: Diagnosis not present

## 2020-11-07 DIAGNOSIS — Z95810 Presence of automatic (implantable) cardiac defibrillator: Secondary | ICD-10-CM

## 2020-11-07 DIAGNOSIS — F32A Depression, unspecified: Secondary | ICD-10-CM

## 2020-11-07 DIAGNOSIS — Q245 Malformation of coronary vessels: Secondary | ICD-10-CM

## 2020-11-07 DIAGNOSIS — I421 Obstructive hypertrophic cardiomyopathy: Secondary | ICD-10-CM

## 2020-11-07 DIAGNOSIS — Z124 Encounter for screening for malignant neoplasm of cervix: Secondary | ICD-10-CM

## 2020-11-07 DIAGNOSIS — Z31430 Encounter of female for testing for genetic disease carrier status for procreative management: Secondary | ICD-10-CM | POA: Diagnosis not present

## 2020-11-07 LAB — POCT URINALYSIS DIP (DEVICE)
Bilirubin Urine: NEGATIVE
Glucose, UA: NEGATIVE mg/dL
Hgb urine dipstick: NEGATIVE
Ketones, ur: NEGATIVE mg/dL
Nitrite: NEGATIVE
Protein, ur: NEGATIVE mg/dL
Specific Gravity, Urine: >=1.03 (ref 1.005–1.030)
Urobilinogen, UA: 0.2 mg/dL (ref 0.0–1.0)
pH: 6 (ref 5.0–8.0)

## 2020-11-07 NOTE — Progress Notes (Signed)
Subjective:   Sara Lopez is a 25 y.o. G1P0 at [redacted]w[redacted]d by LMP being seen today for her first obstetrical visit.  Her obstetrical history is significant for  HOCM and ICD in place . Patient does intend to breast feed. Pregnancy history fully reviewed.  Patient reports no complaints.  HISTORY: OB History  Gravida Para Term Preterm AB Living  1 0 0 0 0 0  SAB IAB Ectopic Multiple Live Births  0 0 0 0 0    # Outcome Date GA Lbr Len/2nd Weight Sex Delivery Anes PTL Lv  1 Current             Last pap smear was  never.  Past Medical History:  Diagnosis Date   ADHD    Coronary-myocardial bridge    LHC at [facility] 2/14: LAD mid myocardial bridge (patent in diastole and near complete occlusion distally) // Myoview 2/14: Inferior attenuation, no ischemia, normal EF   Depression    Development delay    History of echocardiogram 08/2016   Echo 5/18: severe asymmetric septal hypertrophy, EF 60-65, dynamic obstruction at rest, peak LVOT 328 cm/sec and peak gradient 43 mmHg, no RWMA, septal thickness 24.6 mm, post wall thickness 15.5 mm, +SAM, mild MR, severe LAE, PASP 30   History of echocardiogram 1.2014 at [facility]   Echo 1/14: Hypertrophic CM, mild dynamic LVOT - peak 27 mmHg, chordal SAM with LVOT obstruction, mild MR, dilate LCA, abnormal LV diastolic function, hyperdynamic LV systolic function    HOCM (hypertrophic obstructive cardiomyopathy) (HCC)    ICD (implantable cardioverter-defibrillator) in place    Medtronic model [id number] (Protect), serial number [id number], implanted 04/30/2010    Myocardial infarction (HCC)    hx of elevated Tn levels // LHC in 2014 at [facility] with myocardial bridging   VF (ventricular fibrillation) (HCC)    admitted in 2011 with VF arrest >> dx with HOCM // s/p ICD   Past Surgical History:  Procedure Laterality Date   ICD GENERATOR CHANGEOUT N/A 05/10/2018   Procedure: ICD GENERATOR CHANGEOUT;  Surgeon: Duke Salvia, MD;  Location[facility] INVASIVE CV LAB;   Service: Cardiovascular;  Laterality: N/A;   ICD IMPLANT  2011   Family History  Adopted: Yes   Social History   Tobacco Use   Smoking status: Some Days    Pack years: 0.00   Smokeless tobacco: Never  Vaping Use   Vaping Use: Former  Substance Use Topics   Alcohol use: Yes    Alcohol/week: 1.0 standard drink    Types: 1 Glasses of wine per week    Comment: occasional   Drug use: No    Types: Marijuana    Comment: no drugs since 2011   No Known Allergies Current Outpatient Medications on File Prior to Visit  Medication Sig Dispense Refill   Prenatal Vit-Fe Fumarate-FA (MULTIVITAMIN-PRENATAL) 27-0.8 MG TABS tablet Take 1 tablet by mouth daily at 12 noon.     Blood Pressure Monitoring DEVI 1 each by Does not apply route once a week. (Patient not taking: Reported on 11/07/2020) 1 each 0   No current facility-administered medications on file prior to visit.     Exam   Vitals:   11/07/20 1012  BP: 103/67  Pulse: 62  Weight: 165 lb (74.8 kg)   Fetal Heart Rate (bpm): 152  Uterus:     Pelvic Exam: Perineum: no hemorrhoids, normal perineum   Vulva: normal external genitalia, no lesions   Vagina:  normal mucosa, normal discharge   Cervix: no lesions and normal, pap smear done.    Adnexa: normal adnexa and no mass, fullness, tenderness  System: General: well-developed, well-nourished female in no acute distress   Skin: normal coloration and turgor, no rashes   Neurologic: oriented, normal, negative, normal mood   Extremities: normal strength, tone, and muscle mass, ROM of all joints is normal   HEENT PERRLA, extraocular movement intact and sclera clear, anicteric   Neck supple and no masses   Respiratory:  no respiratory distress     Assessment:   Pregnancy: G1P0 Patient Active Problem List   Diagnosis Date Noted   Supervision of high risk pregnancy, antepartum 10/28/2020   Cardiac pacemaker in situ 03/27/2018   Heart disease 03/27/2018   HOCM (hypertrophic  obstructive cardiomyopathy) (HCC) 09/21/2016   History of cardiac arrest 09/21/2016   Coronary-myocardial bridge    History of MI (myocardial infarction) 09/16/2016   ADHD (attention deficit hyperactivity disorder) 07/21/2011   Depression 07/21/2011   Developmental delay 07/21/2011   VF (ventricular fibrillation) (HCC) 07/21/2011   Single implantable cardioverter-defibrillator (ICD) in situ 07/21/2011     Plan:  1. Supervision of high risk pregnancy, antepartum Initial labs drawn. Pap collected Continue prenatal vitamins. Genetic Screening discussed, NIPS: ordered. Ultrasound discussed; fetal anatomic survey: ordered. Problem list reviewed and updated. The nature of [facility] with multiple MDs and other Advanced Practice Providers was explained to patient; also emphasized that residents, students are part of our team. - CBC/D/Plt+RPR+Rh+ABO+RubIgG... - Hemoglobin A1c - Genetic Screening - Culture, OB Urine - Cytology - PAP( [place]) - [facility] AMB BABYSCRIPTS SCHEDULE OPTIMIZATION  2. HOCM (hypertrophic obstructive cardiomyopathy) (HCC) Per review of chart patient diagnosed with HOCM in 2011, at that time was in a detox facility for polysubstance abuse when she collapsed during exertion and found to be in Vfib and was successfully cardioverted. She subsequently was diagnosed and had ICD placed at that time, with the generator replaced by Dr. Graciela Husbands at [facility] in 05/2018. She has also had cardiac workup due to recurrent angina that showed a myocardial bridge and a negative nucelar stress test in 2014 at [facility].  Per last visit with cardiology has been on ACE and BB in the past but not currently due to issues with both compliance and side effects She currently reports occasional SOB, but no chest pain Will refer to Cardio OB and urged her to set up a follow up appt with Dr Graciela Husbands from EP as well Will also need fetal echo after 20 weeks Referral to genetics  for counseling regarding HOCM and impact on baby Will await MFM and Cards eval prior to determining mode of delivery - AMB Referral to Cardio Obstetrics - AMB MFM GENETICS REFERRAL  3. Single implantable cardioverter-defibrillator (ICD) in situ See above - AMB Referral to Cardio Obstetrics  4. Depression, unspecified depression type Reports mood is good  5. Attention deficit hyperactivity disorder (ADHD), unspecified ADHD type Not on meds  6. Coronary-myocardial bridge See above - AMB Referral to Cardio Obstetrics   Routine obstetric precautions reviewed. Return in 4 weeks (on 12/05/2020) for [facility], ob visit, needs MD.

## 2020-11-07 NOTE — Patient Instructions (Signed)

## 2020-11-07 NOTE — Progress Notes (Signed)
Patient stated that she had vaginal bleeding last week after having sex with partner

## 2020-11-08 LAB — CBC/D/PLT+RPR+RH+ABO+RUBIGG...
Antibody Screen: NEGATIVE
Basophils Absolute: 0 10*3/uL (ref 0.0–0.2)
Basos: 0 %
EOS (ABSOLUTE): 0.1 10*3/uL (ref 0.0–0.4)
Eos: 1 %
HCV Ab: <0.1 s/co ratio (ref 0.0–0.9)
HIV Screen 4th Generation wRfx: NONREACTIVE
Hematocrit: 37.6 % (ref 34.0–46.6)
Hemoglobin: 13.1 g/dL (ref 11.1–15.9)
Hepatitis B Surface Ag: NEGATIVE
Immature Grans (Abs): 0 10*3/uL (ref 0.0–0.1)
Immature Granulocytes: 0 %
Lymphocytes Absolute: 1.6 10*3/uL (ref 0.7–3.1)
Lymphs: 26 %
MCH: 30.6 pg (ref 26.6–33.0)
MCHC: 34.8 g/dL (ref 31.5–35.7)
MCV: 88 fL (ref 79–97)
Monocytes Absolute: 0.4 10*3/uL (ref 0.1–0.9)
Monocytes: 7 %
Neutrophils Absolute: 3.9 10*3/uL (ref 1.4–7.0)
Neutrophils: 66 %
Platelets: 176 10*3/uL (ref 150–450)
RBC: 4.28 x10E6/uL (ref 3.77–5.28)
RDW: 12.1 % (ref 11.7–15.4)
RPR Ser Ql: NONREACTIVE
Rh Factor: POSITIVE
Rubella Antibodies, IGG: 0.98 index — ABNORMAL LOW (ref >0.99)
WBC: 6 10*3/uL (ref 3.4–10.8)

## 2020-11-08 LAB — HCV INTERPRETATION

## 2020-11-08 LAB — HEMOGLOBIN A1C
Est. average glucose Bld gHb Est-mCnc: 88 mg/dL
Hgb A1c MFr Bld: 4.7 % — ABNORMAL LOW (ref 4.8–5.6)

## 2020-11-11 LAB — URINE CULTURE, OB REFLEX

## 2020-11-11 LAB — CULTURE, OB URINE

## 2020-11-13 LAB — CYTOLOGY - PAP
Chlamydia: NEGATIVE
Comment: NEGATIVE
Comment: NORMAL
Diagnosis: NEGATIVE
Neisseria Gonorrhea: NEGATIVE

## 2020-11-17 ENCOUNTER — Ambulatory Visit (INDEPENDENT_AMBULATORY_CARE_PROVIDER_SITE_OTHER): Payer: Medicaid Other

## 2020-11-17 DIAGNOSIS — I469 Cardiac arrest, cause unspecified: Secondary | ICD-10-CM

## 2020-11-18 LAB — CUP PACEART REMOTE DEVICE CHECK
Battery Remaining Longevity: 119 mo
Battery Voltage: 3.02 V
Brady Statistic RV Percent Paced: 6.37 %
Date Time Interrogation Session: 20220718022723
HighPow Impedance: 51 Ohm
Implantable Lead Implant Date: 20111229
Implantable Lead Location: 753860
Implantable Lead Model: 7122
Implantable Pulse Generator Implant Date: 20200108
Lead Channel Impedance Value: 817 Ohm
Lead Channel Impedance Value: 912 Ohm
Lead Channel Pacing Threshold Amplitude: 2.5 V
Lead Channel Pacing Threshold Pulse Width: 0.4 ms
Lead Channel Sensing Intrinsic Amplitude: 17.25 mV
Lead Channel Sensing Intrinsic Amplitude: 17.25 mV
Lead Channel Setting Pacing Amplitude: 2.75 V
Lead Channel Setting Pacing Pulse Width: 1 ms
Lead Channel Setting Sensing Sensitivity: 0.6 mV

## 2020-11-19 ENCOUNTER — Encounter: Payer: Self-pay | Admitting: *Deleted

## 2020-12-05 ENCOUNTER — Encounter: Payer: Medicaid Other | Admitting: Family Medicine

## 2020-12-09 NOTE — Progress Notes (Signed)
Remote ICD transmission.   

## 2020-12-10 ENCOUNTER — Other Ambulatory Visit: Payer: Self-pay | Admitting: *Deleted

## 2020-12-10 ENCOUNTER — Encounter: Payer: Self-pay | Admitting: *Deleted

## 2020-12-10 ENCOUNTER — Other Ambulatory Visit: Payer: Self-pay

## 2020-12-10 ENCOUNTER — Ambulatory Visit: Payer: Medicaid Other | Admitting: *Deleted

## 2020-12-10 ENCOUNTER — Ambulatory Visit: Payer: Medicaid Other | Attending: Medical

## 2020-12-10 VITALS — BP 114/65 | HR 66

## 2020-12-10 DIAGNOSIS — O283 Abnormal ultrasonic finding on antenatal screening of mother: Secondary | ICD-10-CM

## 2020-12-10 DIAGNOSIS — O0990 Supervision of high risk pregnancy, unspecified, unspecified trimester: Secondary | ICD-10-CM | POA: Diagnosis not present

## 2020-12-10 DIAGNOSIS — Z362 Encounter for other antenatal screening follow-up: Secondary | ICD-10-CM

## 2020-12-18 ENCOUNTER — Telehealth: Payer: Self-pay

## 2020-12-18 NOTE — Telephone Encounter (Signed)
called patient to advise [facility] is trying to reach her to schedule. recording "call cannot be completed at this time."

## 2020-12-18 NOTE — Telephone Encounter (Signed)
sw pt's mother-(no info or department given only said [facility])requesting contact info for patient-states she has not interacted with her in two years-no other contacts listed.

## 2020-12-18 NOTE — Telephone Encounter (Signed)
Sara Lopez-NOT ABLE TO REACH PT-CALLED TWICE.

## 2020-12-19 ENCOUNTER — Telehealth: Payer: Self-pay

## 2020-12-19 ENCOUNTER — Encounter: Payer: Self-pay | Admitting: Family Medicine

## 2020-12-19 DIAGNOSIS — Z7189 Other specified counseling: Secondary | ICD-10-CM

## 2020-12-19 HISTORY — DX: Other specified counseling: Z71.89

## 2020-12-19 NOTE — Telephone Encounter (Signed)
Called patient twice, the phone rang then had a bust tone. Unable to leave a message.

## 2020-12-25 ENCOUNTER — Other Ambulatory Visit: Payer: Self-pay

## 2020-12-25 ENCOUNTER — Ambulatory Visit: Payer: Medicaid Other | Attending: Obstetrics and Gynecology

## 2020-12-25 DIAGNOSIS — I421 Obstructive hypertrophic cardiomyopathy: Secondary | ICD-10-CM | POA: Diagnosis not present

## 2020-12-30 NOTE — Progress Notes (Signed)
Sara Lopez was referred to [facility] for genetic counseling due to a personal history of hypertrophic cardiomyopathy.  She was present at the visit alone today  We first obtained a pregnancy history and family history.  The patient is currently [redacted] weeks gestation in her first pregnancy.  She reported no complications or exposures in this pregnancy to medications, alcohol, tobacco or recreational drugs.  Her partner, Sara Lopez, is 56 years old and in good health.  Sara Lopez has a maternal first cousin with a form of albinism.  The patient stated this his condition was inherited from each of his parents, consistent with recessive inheritance.  We reviewed that he would have a 1 in 4 chance to be a carrier for this condition but that her chance to be a carrier is low given the fact that she is not related to his family.  If the couple is concerned about this history, we are happy to review medical records regarding the specific gene and gene changes identified in the family and consider carrier screening.  Sara Lopez declined follow up on that history at this time.   The patient herself reported that she was diagnosed with hypertrophic cardiomyopathy at age 25 years following a heart attack while living in [place], [place].  Her family history is not known, as she was adopted from [place] at 25 years of age.  Prior to the heart attack, she states no health or developmental problems aside from receiving speech therapy and help with learning soon after arriving in the [place] and while learning [place].  Hypertrophic cardiomyopathy (HCM) is a cardiac condition that occurs in approximately 1 in 500 individuals.  HCM can have various causes including acquired cases, those related to genetic syndromes, and single gene disorders.  Syndromic causes typically have multisystem effects such as vision loss, muscular findings, kidney differences and ataxia.  Examples include Noonan syndrome, Costello syndrome and  some muscular dystrophies.  In the absence of a syndromic cause, numerous single genes have been identified to cause HCM. Most single gene disorders related to HCM are inherited in a dominant manner, with each offspring of an affected parent having a 50% chance for inheriting the pathogenic variant.  Approximately 20-30% of patients with an identified gene for HCM have no prior family history of the condition.  Some genetic variants show significant variability in the presentation among different family members. If the variant in an individual is known, then testing can be made available to other family members who may be at risk.  This can ensure appropriate surveillance in those who inherit the gene change.  If no genetic cause has been identified, the recurrence risk in first degree relatives is estimated to be 1 in 8.    Sara Lopez was diagnosed during initial evaluation in [place] then has been followed by [facility]. She recalls that genetic testing was performed, however, does not have documentation of that testing.  While here for this visit, she signed a release form for [place] to access those records if available at [facility].  When we receive those records, we will reach out to her and review the inheritance and available testing options.  Amniocentesis could be considered if the genetic variant is Sara Lopez is known, though she indicated that she was not likely interested in invasive testing.  As part of this visit, we also reviewed the results of her Panorama aneuploidy screening and Horizon Basic carrier testing, all of which are within normal limits.  See those reports for details and residual risks.  Plan of care: Maternal follow up with MFM and cardiology as recommended. Fetal echocardiogram ordered. Follow up ultrasound as clinically indicated. We will reach out once records become available regarding her prior genetic testing.We encourage her to make her pediatrician aware of this history due  to the up to 50% chance for HCM in her children.  We appreciate being involved in the care of this patient and can be reached at [phone].  Cherly Anderson, MS, CGC

## 2020-12-31 ENCOUNTER — Ambulatory Visit (HOSPITAL_BASED_OUTPATIENT_CLINIC_OR_DEPARTMENT_OTHER): Payer: Medicaid Other

## 2020-12-31 ENCOUNTER — Telehealth: Payer: Self-pay

## 2020-12-31 ENCOUNTER — Other Ambulatory Visit: Payer: Self-pay | Admitting: *Deleted

## 2020-12-31 ENCOUNTER — Ambulatory Visit: Payer: Medicaid Other | Attending: Obstetrics and Gynecology | Admitting: *Deleted

## 2020-12-31 ENCOUNTER — Ambulatory Visit: Payer: Medicaid Other

## 2020-12-31 ENCOUNTER — Other Ambulatory Visit: Payer: Self-pay

## 2020-12-31 VITALS — BP 106/60 | HR 61

## 2020-12-31 DIAGNOSIS — Z3A21 21 weeks gestation of pregnancy: Secondary | ICD-10-CM | POA: Diagnosis not present

## 2020-12-31 DIAGNOSIS — Z362 Encounter for other antenatal screening follow-up: Secondary | ICD-10-CM | POA: Insufficient documentation

## 2020-12-31 DIAGNOSIS — I252 Old myocardial infarction: Secondary | ICD-10-CM | POA: Diagnosis not present

## 2020-12-31 DIAGNOSIS — Z8759 Personal history of other complications of pregnancy, childbirth and the puerperium: Secondary | ICD-10-CM | POA: Insufficient documentation

## 2020-12-31 DIAGNOSIS — O99412 Diseases of the circulatory system complicating pregnancy, second trimester: Secondary | ICD-10-CM | POA: Insufficient documentation

## 2020-12-31 DIAGNOSIS — Z950 Presence of cardiac pacemaker: Secondary | ICD-10-CM | POA: Insufficient documentation

## 2020-12-31 DIAGNOSIS — O283 Abnormal ultrasonic finding on antenatal screening of mother: Secondary | ICD-10-CM | POA: Insufficient documentation

## 2020-12-31 DIAGNOSIS — I429 Cardiomyopathy, unspecified: Secondary | ICD-10-CM | POA: Insufficient documentation

## 2020-12-31 DIAGNOSIS — O0990 Supervision of high risk pregnancy, unspecified, unspecified trimester: Secondary | ICD-10-CM

## 2020-12-31 NOTE — Telephone Encounter (Signed)
FETAL ECHO SCHEDULED FOR 01/13/2021@900AM . SCHEDULED WITH PATIENT AT MFM CHECK OUT (WAS NOT ABLE TO REACH BY PHONE-PHONE NBR FOR PT UPDATED).

## 2021-01-07 ENCOUNTER — Ambulatory Visit: Payer: Medicaid Other

## 2021-01-08 ENCOUNTER — Telehealth: Payer: Self-pay | Admitting: Obstetrics and Gynecology

## 2021-01-08 NOTE — Telephone Encounter (Signed)
I called and left a message for Sara Lopez in follow up to her genetic counseling visit on 12/25/20.  Sara Lopez signed a release of medical records form to obtain cardiology genetic test results from [facility], which Sara Lopez thought had been done. Upon speaking with [facility], it appears that no such records exist regarding any genetic testing for her hypertrophic cardiomyopathy.  I called to offer Sara Lopez Sara option to return to [facility] to see Sara cardiac genetic counselor as well as Sara cardiologist regarding possible genetic testing.  I can be reached at [phone] if she would like to speak further or for me to arrange for this referral.  Sara Anderson, MS, CGC

## 2021-01-28 ENCOUNTER — Ambulatory Visit: Payer: Medicaid Other | Attending: Obstetrics

## 2021-01-28 ENCOUNTER — Ambulatory Visit: Payer: Medicaid Other

## 2021-01-28 ENCOUNTER — Other Ambulatory Visit: Payer: Self-pay

## 2021-01-28 ENCOUNTER — Encounter: Payer: Self-pay | Admitting: *Deleted

## 2021-01-28 ENCOUNTER — Ambulatory Visit: Payer: Medicaid Other | Admitting: *Deleted

## 2021-01-28 VITALS — BP 110/61 | HR 65

## 2021-01-28 DIAGNOSIS — O0990 Supervision of high risk pregnancy, unspecified, unspecified trimester: Secondary | ICD-10-CM | POA: Diagnosis present

## 2021-01-28 DIAGNOSIS — I252 Old myocardial infarction: Secondary | ICD-10-CM | POA: Diagnosis not present

## 2021-01-28 DIAGNOSIS — Z362 Encounter for other antenatal screening follow-up: Secondary | ICD-10-CM

## 2021-01-28 DIAGNOSIS — Z3A25 25 weeks gestation of pregnancy: Secondary | ICD-10-CM

## 2021-01-28 DIAGNOSIS — O2692 Pregnancy related conditions, unspecified, second trimester: Secondary | ICD-10-CM | POA: Diagnosis not present

## 2021-01-29 ENCOUNTER — Other Ambulatory Visit: Payer: Self-pay | Admitting: *Deleted

## 2021-01-29 DIAGNOSIS — I4901 Ventricular fibrillation: Secondary | ICD-10-CM

## 2021-01-29 DIAGNOSIS — Z95810 Presence of automatic (implantable) cardiac defibrillator: Secondary | ICD-10-CM

## 2021-01-29 DIAGNOSIS — I252 Old myocardial infarction: Secondary | ICD-10-CM

## 2021-02-11 ENCOUNTER — Telehealth: Payer: Self-pay | Admitting: Radiology

## 2021-02-11 NOTE — Telephone Encounter (Signed)
Called patient's "Home number" which turned out to be her boyfriend's number. Her cell phone is not working at this time. I asked him to have Sara Lopez call to schedule an appointment form [facility]. He stated that he was at work, wont get off until after 5:00 but will give her the number so that she can call me tomorrow. Need to Schedule OB appt. Has not been seen in office since 11/07/20.

## 2021-02-16 ENCOUNTER — Ambulatory Visit: Payer: Self-pay

## 2021-02-25 ENCOUNTER — Ambulatory Visit: Payer: Medicaid Other | Admitting: *Deleted

## 2021-02-25 ENCOUNTER — Encounter: Payer: Self-pay | Admitting: *Deleted

## 2021-02-25 ENCOUNTER — Other Ambulatory Visit: Payer: Self-pay | Admitting: *Deleted

## 2021-02-25 ENCOUNTER — Ambulatory Visit: Payer: Medicaid Other | Attending: Obstetrics

## 2021-02-25 ENCOUNTER — Other Ambulatory Visit: Payer: Self-pay

## 2021-02-25 VITALS — BP 104/61

## 2021-02-25 DIAGNOSIS — Z95810 Presence of automatic (implantable) cardiac defibrillator: Secondary | ICD-10-CM | POA: Insufficient documentation

## 2021-02-25 DIAGNOSIS — I4901 Ventricular fibrillation: Secondary | ICD-10-CM | POA: Insufficient documentation

## 2021-02-25 DIAGNOSIS — O99413 Diseases of the circulatory system complicating pregnancy, third trimester: Secondary | ICD-10-CM | POA: Diagnosis not present

## 2021-02-25 DIAGNOSIS — Z3A29 29 weeks gestation of pregnancy: Secondary | ICD-10-CM

## 2021-02-25 DIAGNOSIS — I499 Cardiac arrhythmia, unspecified: Secondary | ICD-10-CM

## 2021-02-25 DIAGNOSIS — I252 Old myocardial infarction: Secondary | ICD-10-CM

## 2021-02-25 DIAGNOSIS — O0990 Supervision of high risk pregnancy, unspecified, unspecified trimester: Secondary | ICD-10-CM | POA: Diagnosis present

## 2021-02-25 DIAGNOSIS — I422 Other hypertrophic cardiomyopathy: Secondary | ICD-10-CM

## 2021-02-26 ENCOUNTER — Other Ambulatory Visit: Payer: Self-pay | Admitting: *Deleted

## 2021-02-26 DIAGNOSIS — O0990 Supervision of high risk pregnancy, unspecified, unspecified trimester: Secondary | ICD-10-CM

## 2021-02-27 ENCOUNTER — Other Ambulatory Visit: Payer: Medicaid Other

## 2021-02-27 ENCOUNTER — Other Ambulatory Visit: Payer: Self-pay

## 2021-02-27 DIAGNOSIS — O0990 Supervision of high risk pregnancy, unspecified, unspecified trimester: Secondary | ICD-10-CM

## 2021-02-28 LAB — CBC
Hematocrit: 35.2 % (ref 34.0–46.6)
Hemoglobin: 12.2 g/dL (ref 11.1–15.9)
MCH: 30.3 pg (ref 26.6–33.0)
MCHC: 34.7 g/dL (ref 31.5–35.7)
MCV: 88 fL (ref 79–97)
Platelets: 194 10*3/uL (ref 150–450)
RBC: 4.02 x10E6/uL (ref 3.77–5.28)
RDW: 11.5 % — ABNORMAL LOW (ref 11.7–15.4)
WBC: 5.9 10*3/uL (ref 3.4–10.8)

## 2021-02-28 LAB — RPR: RPR Ser Ql: NONREACTIVE

## 2021-02-28 LAB — GLUCOSE TOLERANCE, 2 HOURS W/ 1HR
Glucose, 1 hour: 134 mg/dL (ref 70–179)
Glucose, 2 hour: 65 mg/dL — ABNORMAL LOW (ref 70–152)
Glucose, Fasting: 73 mg/dL (ref 70–91)

## 2021-02-28 LAB — HIV ANTIBODY (ROUTINE TESTING W REFLEX): HIV Screen 4th Generation wRfx: NONREACTIVE

## 2021-03-09 NOTE — Progress Notes (Signed)
PRENATAL VISIT NOTE  Subjective:  Sara Lopez is a 25 y.o. G1P0 at [redacted]w[redacted]d being seen today for ongoing prenatal care.  She is currently monitored for the following issues for this high-risk pregnancy and has History of MI (myocardial infarction); HOCM (hypertrophic obstructive cardiomyopathy) ([place]); History of cardiac arrest; Coronary-myocardial bridge; ADHD (attention deficit hyperactivity disorder); Cardiac pacemaker in situ; Depression; Developmental delay; Heart disease; VF (ventricular fibrillation) ([place]); Single implantable cardioverter-defibrillator (ICD) in situ; Supervision of high risk pregnancy, antepartum; and Red Chart Rounds Patient on their problem list.  Patient reports no complaints.  Contractions: Not present. Vag. Bleeding: None.  Movement: Present. Denies leaking of fluid.   The following portions of the patient's history were reviewed and updated as appropriate: allergies, current medications, past family history, past medical history, past social history, past surgical history and problem list.   Objective:   Vitals:   03/11/21 1418  BP: 105/72  Pulse: 85  Weight: 191 lb 8 oz (86.9 kg)    Fetal Status: Fetal Heart Rate (bpm): 148 Fundal Height: 31 cm Movement: Present     General:  Alert, oriented and cooperative. Patient is in no acute distress.  Skin: Skin is warm and dry. No rash noted.   Cardiovascular: Normal heart rate noted  Respiratory: Normal respiratory effort, no problems with respiration noted  Abdomen: Soft, gravid, appropriate for gestational age.  Pain/Pressure: Present     Pelvic: Cervical exam deferred        Extremities: Normal range of motion.  Edema: None  Mental Status: Normal mood and affect. Normal behavior. Normal judgment and thought content.   Assessment and Plan:  Pregnancy: G1P0 at [redacted]w[redacted]d 1. HOCM (hypertrophic obstructive cardiomyopathy) ([place]) - Fetal Echo is scheduled for 12/8 at 1045 - Referral made to [facility] with Dr.  Harriet Masson. Discussed the importance of f/u with them and coordinated care during the pregnancy. Cardio appointment 11/15 at [place] she is due for an Echo - referral and order submitted.  - Plan is to avoid diuretics during the ante/postpartum time period. Reminders placed on the chart - Appt on 11/10 is for her regular cardiology group - needs anesthesia consult - I sent a message today to Drs. Richmond Campbell for consult.   2. Coronary-myocardial bridge  3. Single implantable cardioverter-defibrillator (ICD) in situ - Has device checks scheduled regularly but we discussed the importance of seeing Dr. Caryl Comes the EP. Saw Dr. Caryl Comes last prior to covid - Has had one device shock when needed - that was for the vfib.   4. Supervision of high risk pregnancy, antepartum - Last growth on 10/26 was normal -- 44%ile, normal afi, cephalic. Next growth is scheduled for 11/23.  - 28w labs normal - tdap given today - Had lapse in care and forgot to reschedule. She also had to get a new phone number. I updated that in the chart. Home number is her spouse.  - Would like 2 kids she thinks - would like POP after delivery (for lower hormones). Is considering ppIUD.   Preterm labor symptoms and general obstetric precautions including but not limited to vaginal bleeding, contractions, leaking of fluid and fetal movement were reviewed in detail with the patient. Please refer to After Visit Summary for other counseling recommendations.   Return in about 2 weeks (around 03/25/2021) for OB VISIT, MD only.  Future Appointments  Date Time Provider [place]  03/12/2021  8:00 AM Shirley Friar, PA-C [facility]  03/17/2021  9:00 AM Thomasene Ripple, DO [facility]  03/25/2021  3:30 PM [facility] NURSE [facility]  03/25/2021  3:45 PM [facility] US1 [facility]  03/31/2021  2:15 PM Venora Maples, MD [facility]  05/18/2021 10:00 AM [facility] DEVICE REMOTES [facility]  [facility]  08/17/2021 10:00 AM [facility] DEVICE REMOTES [facility]  11/16/2021 10:00 AM [facility] DEVICE REMOTES [facility]  02/15/2022 10:00 AM [facility] DEVICE REMOTES [facility]    Milas Hock, MD

## 2021-03-10 NOTE — Progress Notes (Signed)
Electrophysiology Office Note Date: 03/10/2021  ID:  Sara Lopez, DOB 1995/06/05, MRN [id number]  PCP: Loletta Specter, PA-C Primary Cardiologist: None Electrophysiologist: Sherryl Manges, MD   CC: Routine ICD follow-up  Sara Lopez is a 25 y.o. female seen today for Sherryl Manges, MD for routine electrophysiology followup.  Since last being seen in our clinic the patient reports doing very well.  She is due in January with a little girl, her first. She has occasional palpitations. She rests or puts her hands behind her head and they resolve quickly.  she denies chest pain, dyspnea, PND, orthopnea, nausea, vomiting, dizziness, syncope, edema, weight gain, or early satiety. She has not had ICD shocks.   Device History: MDT single chamber ICD 04/30/10 with gen change 2020 secondary prevention  Past Medical History:  Diagnosis Date   ADHD    Coronary-myocardial bridge    LHC at [facility] 2/14: LAD mid myocardial bridge (patent in diastole and near complete occlusion distally) // Myoview 2/14: Inferior attenuation, no ischemia, normal EF   Depression    Development delay    History of echocardiogram 08/2016   Echo 5/18: severe asymmetric septal hypertrophy, EF 60-65, dynamic obstruction at rest, peak LVOT 328 cm/sec and peak gradient 43 mmHg, no RWMA, septal thickness 24.6 mm, post wall thickness 15.5 mm, +SAM, mild MR, severe LAE, PASP 30   History of echocardiogram 1.2014 at [facility]   Echo 1/14: Hypertrophic CM, mild dynamic LVOT - peak 27 mmHg, chordal SAM with LVOT obstruction, mild MR, dilate LCA, abnormal LV diastolic function, hyperdynamic LV systolic function    HOCM (hypertrophic obstructive cardiomyopathy) (HCC)    ICD (implantable cardioverter-defibrillator) in place    Medtronic model D334DRM (Protect), serial number [id number], implanted 04/30/2010    Myocardial infarction (HCC)    hx of elevated Tn levels // LHC in 2014 [facility] with myocardial bridging   VF  (ventricular fibrillation) (HCC)    admitted in 2011 with VF arrest >> dx with HOCM // s/p ICD   Past Surgical History:  Procedure Laterality Date   ICD GENERATOR CHANGEOUT N/A 05/10/2018   Procedure: ICD GENERATOR CHANGEOUT;  Surgeon: [facility] Salvia, MD;  Location: [facility] INVASIVE CV LAB;  Service: Cardiovascular;  Laterality: N/A;   ICD IMPLANT  2011    Current Outpatient Medications  Medication Sig Dispense Refill   Blood Pressure Monitoring DEVI 1 each by Does not apply route once a week. (Patient not taking: No sig reported) 1 each 0   Prenatal Vit-Fe Fumarate-FA (MULTIVITAMIN-PRENATAL) 27-0.8 MG TABS tablet Take 1 tablet by mouth daily at 12 noon.     No current facility-administered medications for this visit.    Allergies:   Patient has no known allergies.   Social History: Social History   Socioeconomic History   Marital status: Single    Spouse name: Not on file   Number of children: Not on file   Years of education: Not on file   Highest education level: Not on file  Occupational History   Occupation: Conservation officer, nature  Tobacco Use   Smoking status: Former    Types: Cigarettes    Quit date: 02/27/2020    Years since quitting: 1.0   Smokeless tobacco: Former  Building services engineer Use: Former   Devices: not since Chiropodist  Substance and Sexual Activity   Alcohol use: Not Currently    Alcohol/week: 1.0 standard drink    Types: 1 Glasses of wine per week  Comment: not since preg   Drug use: No    Types: Marijuana    Comment: no drugs since 2011   Sexual activity: Yes    Birth control/protection: None  Other Topics Concern   Not on file  Social History Narrative   Trying to get a job with [employer]    Currently working as Conservation officer, 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   Physical Exam: There were no vitals filed for this visit.   GEN- The patient is well appearing, alert and oriented x 3 today.   HEENT: normocephalic, atraumatic; sclera clear, conjunctiva pink; hearing intact; oropharynx clear; neck supple, no JVP Lymph- no cervical lymphadenopathy Lungs- Clear to ausculation bilaterally, normal work of breathing.  No wheezes, rales, rhonchi Heart- Regular rate and rhythm, no murmurs, rubs or gallops, PMI not laterally displaced GI- soft, non-tender, non-distended, bowel sounds present, no hepatosplenomegaly Extremities- no clubbing or cyanosis. No edema; DP/PT/radial pulses 2+ bilaterally MS- no significant deformity or atrophy Skin- warm and dry, no rash or lesion; ICD pocket well healed Psych- euthymic mood, full affect Neuro- strength and sensation are intact  ICD interrogation- reviewed in detail today,  See PACEART report  EKG:  EKG is ordered today. Personal review of EKG ordered today shows NSR at 68 bpm  Recent Labs: 02/27/2021: Hemoglobin 12.2; Platelets 194   Wt Readings from Last 3 Encounters:  11/07/20 165 lb (74.8 kg)  09/15/20 164 lb 4.8 oz (74.5 kg)  07/31/18 175 lb (79.4 kg)     Other studies Reviewed: Additional studies/ records that were reviewed today include: Previous EP office notes.   Assessment and Plan:  1.  HOCM with h/o cardiac arrest s/p Medtronic single chamber ICD  Previous arrest was in the setting of significant drug use, which she denies euvolemic today Stable on an appropriate medical regimen Normal ICD function See Pace Art report No changes today  2. ETOH use/abuse Denies  3. High risk pregnancy Due in January with her first, a girl. Following regularly with  OB as a "high risk" pregnancy Echo pending.   Current medicines are reviewed at length with the patient today.    Labs/ tests ordered today include:    Disposition:   Follow up with Dr. Graciela Husbands in 5 months. (3 months post partum)   Signed, Graciella Freer, PA-C  03/10/2021 11:12 AM  [facility] [address] [place] [address] [phone] (office) [phone] (fax)

## 2021-03-11 ENCOUNTER — Other Ambulatory Visit: Payer: Self-pay

## 2021-03-11 ENCOUNTER — Encounter: Payer: Medicaid Other | Admitting: Obstetrics and Gynecology

## 2021-03-11 ENCOUNTER — Ambulatory Visit (INDEPENDENT_AMBULATORY_CARE_PROVIDER_SITE_OTHER): Payer: Medicaid Other | Admitting: Obstetrics and Gynecology

## 2021-03-11 VITALS — BP 105/72 | HR 85 | Wt 191.5 lb

## 2021-03-11 DIAGNOSIS — O0990 Supervision of high risk pregnancy, unspecified, unspecified trimester: Secondary | ICD-10-CM | POA: Diagnosis not present

## 2021-03-11 DIAGNOSIS — Q245 Malformation of coronary vessels: Secondary | ICD-10-CM

## 2021-03-11 DIAGNOSIS — Z7189 Other specified counseling: Secondary | ICD-10-CM

## 2021-03-11 DIAGNOSIS — Z23 Encounter for immunization: Secondary | ICD-10-CM

## 2021-03-11 DIAGNOSIS — Z95810 Presence of automatic (implantable) cardiac defibrillator: Secondary | ICD-10-CM

## 2021-03-11 DIAGNOSIS — I421 Obstructive hypertrophic cardiomyopathy: Secondary | ICD-10-CM

## 2021-03-12 ENCOUNTER — Ambulatory Visit (INDEPENDENT_AMBULATORY_CARE_PROVIDER_SITE_OTHER): Payer: Medicaid Other | Admitting: Student

## 2021-03-12 ENCOUNTER — Encounter: Payer: Self-pay | Admitting: Student

## 2021-03-12 VITALS — BP 100/68 | HR 68 | Ht 65.0 in | Wt 191.0 lb

## 2021-03-12 DIAGNOSIS — I421 Obstructive hypertrophic cardiomyopathy: Secondary | ICD-10-CM

## 2021-03-12 DIAGNOSIS — I469 Cardiac arrest, cause unspecified: Secondary | ICD-10-CM

## 2021-03-12 LAB — CUP PACEART INCLINIC DEVICE CHECK
Battery Remaining Longevity: 116 mo
Battery Voltage: 3.02 V
Brady Statistic RV Percent Paced: 4.71 %
Date Time Interrogation Session: 20221110081813
HighPow Impedance: 56 Ohm
Implantable Lead Implant Date: 20111229
Implantable Lead Location: 753860
Implantable Lead Model: 7122
Implantable Pulse Generator Implant Date: 20200108
Lead Channel Impedance Value: 817 Ohm
Lead Channel Impedance Value: 912 Ohm
Lead Channel Pacing Threshold Amplitude: 2.5 V
Lead Channel Pacing Threshold Pulse Width: 0.4 ms
Lead Channel Sensing Intrinsic Amplitude: 16 mV
Lead Channel Sensing Intrinsic Amplitude: 16.875 mV
Lead Channel Setting Pacing Amplitude: 3.5 V
Lead Channel Setting Pacing Pulse Width: 1 ms
Lead Channel Setting Sensing Sensitivity: 0.6 mV

## 2021-03-12 NOTE — Patient Instructions (Signed)
Medication Instructions:  Your physician recommends that you continue on your current medications as directed. Please refer to the Current Medication list given to you today.  *If you need a refill on your cardiac medications before your next appointment, please call your pharmacy*   Lab Work: None If you have labs (blood work) drawn today and your tests are completely normal, you will receive your results only by: MyChart Message (if you have MyChart) OR A paper copy in the mail If you have any lab test that is abnormal or we need to change your treatment, we will call you to review the results.   Follow-Up: At [facility], you and your health needs are our priority.  As part of our continuing mission to provide you with exceptional heart care, we have created designated Provider Care Teams.  These Care Teams include your primary Cardiologist (physician) and Advanced Practice Providers (APPs -  Physician Assistants and Nurse Practitioners) who all work together to provide you with the care you need, when you need it.   Your next appointment:   5 month(s)  The format for your next appointment:   In Person  Provider:   Sherryl Manges, MD

## 2021-03-17 ENCOUNTER — Other Ambulatory Visit: Payer: Self-pay

## 2021-03-17 ENCOUNTER — Encounter: Payer: Self-pay | Admitting: Cardiology

## 2021-03-17 ENCOUNTER — Ambulatory Visit (INDEPENDENT_AMBULATORY_CARE_PROVIDER_SITE_OTHER): Payer: Medicaid Other | Admitting: Cardiology

## 2021-03-17 VITALS — BP 100/72 | HR 72 | Ht 65.0 in | Wt 195.0 lb

## 2021-03-17 DIAGNOSIS — I421 Obstructive hypertrophic cardiomyopathy: Secondary | ICD-10-CM | POA: Diagnosis not present

## 2021-03-17 DIAGNOSIS — Q245 Malformation of coronary vessels: Secondary | ICD-10-CM | POA: Diagnosis not present

## 2021-03-17 DIAGNOSIS — Z95810 Presence of automatic (implantable) cardiac defibrillator: Secondary | ICD-10-CM

## 2021-03-17 DIAGNOSIS — O0990 Supervision of high risk pregnancy, unspecified, unspecified trimester: Secondary | ICD-10-CM

## 2021-03-17 NOTE — Patient Instructions (Addendum)
Medication Instructions:  Your physician recommends that you continue on your current medications as directed. Please refer to the Current Medication list given to you today.  *If you need a refill on your cardiac medications before your next appointment, please call your pharmacy*   Lab Work: None If you have labs (blood work) drawn today and your tests are completely normal, you will receive your results only by: MyChart Message (if you have MyChart) OR A paper copy in the mail If you have any lab test that is abnormal or we need to change your treatment, we will call you to review the results.   Testing/Procedures: Your physician has requested that you have an echocardiogram. Echocardiography is a painless test that uses sound waves to create images of your heart. It provides your doctor with information about the size and shape of your heart and how well your heart's chambers and valves are working. This procedure takes approximately one hour. There are no restrictions for this procedure.    Follow-Up: At [facility], you and your health needs are our priority.  As part of our continuing mission to provide you with exceptional heart care, we have created designated Provider Care Teams.  These Care Teams include your primary Cardiologist (physician) and Advanced Practice Providers (APPs -  Physician Assistants and Nurse Practitioners) who all work together to provide you with the care you need, when you need it.  We recommend signing up for the patient portal called "MyChart".  Sign up information is provided on this After Visit Summary.  MyChart is used to connect with patients for Virtual Visits (Telemedicine).  Patients are able to view lab/test results, encounter notes, upcoming appointments, etc.  Non-urgent messages can be sent to your provider as well.   To learn more about what you can do with MyChart, go to [email or web address].    Your next appointment:   January  2023  The format for your next appointment:   In Person  Provider:   Thomasene Ripple, DO   Other Instructions  Echocardiogram An echocardiogram is a test that uses sound waves (ultrasound) to produce images of the heart. Images from an echocardiogram can provide important information about: Heart size and shape. The size and thickness and movement of your heart's walls. Heart muscle function and strength. Heart valve function or if you have stenosis. Stenosis is when the heart valves are too narrow. If blood is flowing backward through the heart valves (regurgitation). A tumor or infectious growth around the heart valves. Areas of heart muscle that are not working well because of poor blood flow or injury from a heart attack. Aneurysm detection. An aneurysm is a weak or damaged part of an artery wall. The wall bulges out from the normal force of blood pumping through the body. Tell a health care provider about: Any allergies you have. All medicines you are taking, including vitamins, herbs, eye drops, creams, and over-the-counter medicines. Any blood disorders you have. Any surgeries you have had. Any medical conditions you have. Whether you are pregnant or may be pregnant. What are the risks? Generally, this is a safe test. However, problems may occur, including an allergic reaction to dye (contrast) that may be used during the test. What happens before the test? No specific preparation is needed. You may eat and drink normally. What happens during the test?  You will take off your clothes from the waist up and put on a hospital gown. Electrodes or electrocardiogram (ECG)patches may be placed  on your chest. The electrodes or patches are then connected to a device that monitors your heart rate and rhythm. You will lie down on a table for an ultrasound exam. A gel will be applied to your chest to help sound waves pass through your skin. A handheld device, called a transducer, will be  pressed against your chest and moved over your heart. The transducer produces sound waves that travel to your heart and bounce back (or "echo" back) to the transducer. These sound waves will be captured in real-time and changed into images of your heart that can be viewed on a video monitor. The images will be recorded on a computer and reviewed by your health care provider. You may be asked to change positions or hold your breath for a short time. This makes it easier to get different views or better views of your heart. In some cases, you may receive contrast through an IV in one of your veins. This can improve the quality of the pictures from your heart. The procedure may vary among health care providers and hospitals. What can I expect after the test? You may return to your normal, everyday life, including diet, activities, and medicines, unless your health care provider tells you not to do that. Follow these instructions at home: It is up to you to get the results of your test. Ask your health care provider, or the department that is doing the test, when your results will be ready. Keep all follow-up visits. This is important. Summary An echocardiogram is a test that uses sound waves (ultrasound) to produce images of the heart. Images from an echocardiogram can provide important information about the size and shape of your heart, heart muscle function, heart valve function, and other possible heart problems. You do not need to do anything to prepare before this test. You may eat and drink normally. After the echocardiogram is completed, you may return to your normal, everyday life, unless your health care provider tells you not to do that. This information is not intended to replace advice given to you by your health care provider. Make sure you discuss any questions you have with your health care provider. Document Revised: 12/31/2020 Document Reviewed: 12/11/2019 Elsevier Patient Education   2022 [employer].

## 2021-03-17 NOTE — Progress Notes (Signed)
Cardio-Obstetrics Clinic  New Evaluation  Date:  03/17/2021   ID:  Sara Lopez, DOB 02-Jul-1995, MRN [id number]  PCP:  Clent Demark, PA-C   [facility] Providers Cardiologist:  Berniece Salines, DO  Electrophysiologist:  Virl Axe, MD       Referring MD: Clarnce Flock, MD   Chief Complaint: " I was referred due to my heart history"  History of Present Illness:    Sara Lopez is a 25 y.o. female [G1P0] who is being seen today for the evaluation of hypertrophic cardiomyopathy in pregnancy at the request of Clarnce Flock, MD.   History of hypertrophic cardiomyopathy diagnosed as a teenager is now status post Medtronic ICD due to ventricular arrhythmia for secondary prevention with initial ICD placement in 2011 and later change in 2020 she has rerelease follow-up with Dr. 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  The patient was referred to the cardio obstetrics clinic to be followed during her pregnancy.  She offers no complaints today.  She tells me she has been doing well with this pregnancy.  She denies any shortness of breath, any lightheadedness or dizziness.  Patient is well educated on her diagnosis of hypertrophic cardiomyopathy and has had this condition since she was a teenager.  Prior CV Studies Reviewed: The following studies were reviewed today: Echocardiogram done in 2018 Study Conclusions   - Left ventricle: Peak gradient with valsalva 34 mmHg. Chordal SAM    with small LVOT gradient peak velocity at rest less than 2 m/sec.    Wall thickness was increased in a pattern of moderate LVH.    Systolic function was normal. The estimated ejection fraction was    in the range of 60% to 65%. Doppler parameters are consistent    with abnormal left ventricular relaxation (grade 1 diastolic    dysfunction).  - Left atrium: The atrium was moderately  dilated.  - Atrial septum: No defect or patent foramen ovale was identified.   -------------------------------------------------------------------  Labs, prior tests, procedures, and surgery:  Defibrillator.   -------------------------------------------------------------------  Study data:   Study status:  Routine.  Procedure:  The patient  reported no pain pre or post test. Transthoracic echocardiography  for left ventricular function evaluation, for right ventricular  function evaluation, and for assessment of valvular function. Image  quality was adequate.  Study completion:  There were no  complications.          Transthoracic echocardiography.  M-mode,  complete 2D, spectral Doppler, and color Doppler.  Birthdate:  Patient birthdate: 11-May-1995.  Age:  Patient is 25 yr old.  Sex:  Gender: female.    BMI: 30.3 kg/m^2.  Blood pressure:     100/64  Patient status:  Outpatient.  Study date:  Study date: 01/28/2017.  Study time: 04:07 PM.  Location:  [place] [facility]   -------------------------------------------------------------------   -------------------------------------------------------------------  Left ventricle:  Peak gradient with valsalva 34 mmHg. Chordal SAM  with small LVOT gradient peak velocity at rest less than 2 m/sec.  Wall thickness was increased in a pattern of moderate LVH.  Systolic function was normal. The estimated ejection fraction was  in the range of 60% to 65%. Doppler parameters are consistent with  abnormal left ventricular relaxation (grade 1 diastolic  dysfunction).   -------------------------------------------------------------------  Aortic valve:   Trileaflet; mildly thickened, mildly calcified  leaflets.  Doppler:   There was  no stenosis.   -------------------------------------------------------------------  Aorta:  The aorta was normal, not dilated, and non-diseased.   -------------------------------------------------------------------   Mitral valve:   Mildly thickened leaflets .  Doppler:  There was  trivial regurgitation.   -------------------------------------------------------------------  Left atrium:  The atrium was moderately dilated.   -------------------------------------------------------------------  Atrial septum:  No defect or patent foramen ovale was identified.     -------------------------------------------------------------------  Right ventricle:  The cavity size was normal. Wall thickness was  normal. Pacer wire or catheter noted in right ventricle. Systolic  function was normal.   -------------------------------------------------------------------  Pulmonic valve:    Doppler:  There was mild regurgitation.   -------------------------------------------------------------------  Tricuspid valve:   Doppler:  There was mild regurgitation.   -------------------------------------------------------------------  Right atrium:  The atrium was normal in size.   -------------------------------------------------------------------  Pericardium:  The pericardium was normal in appearance.   -------------------------------------------------------------------  Systemic veins:  Inferior vena cava: The vessel was normal in size. The  respirophasic diameter changes were in the normal range (>= 50%),  consistent with normal central venous pressure.   -------------------------------------------------------------------  Post procedure conclusions  Ascending Aorta:   - The aorta was normal, not dilated, and non-diseased.   -------------------------------------------------------------------  Measurements   Past Medical History:  Diagnosis Date   ADHD    Coronary-myocardial bridge    LHC at [facility] 2/14: LAD mid myocardial bridge (patent in diastole and near complete occlusion distally) // Myoview 2/14: Inferior attenuation, no ischemia, normal EF   Depression    Development delay    History of echocardiogram  08/2016   Echo 5/18: severe asymmetric septal hypertrophy, EF 60-65, dynamic obstruction at rest, peak LVOT 328 cm/sec and peak gradient 43 mmHg, no RWMA, septal thickness 24.6 mm, post wall thickness 15.5 mm, +SAM, mild MR, severe LAE, PASP 30   History of echocardiogram 1.2014 at [facility]   Echo 1/14: Hypertrophic CM, mild dynamic LVOT - peak 27 mmHg, chordal SAM with LVOT obstruction, mild MR, dilate LCA, abnormal LV diastolic function, hyperdynamic LV systolic function    HOCM (hypertrophic obstructive cardiomyopathy) ([place])    ICD (implantable cardioverter-defibrillator) in place    Medtronic model D334DRM (Protect), serial number [id number], implanted 04/30/2010    Myocardial infarction ([place])    hx of elevated Tn levels // LHC in 2014 at [facility] with myocardial bridging   VF (ventricular fibrillation) ([place])    admitted in 2011 with VF arrest >> dx with HOCM // s/p ICD    Past Surgical History:  Procedure Laterality Date   ICD GENERATOR CHANGEOUT N/A 05/10/2018   Procedure: ICD GENERATOR CHANGEOUT;  Surgeon: Deboraha Sprang, MD;  Location: [place] CV LAB;  Service: Cardiovascular;  Laterality: N/A;   ICD IMPLANT  2011      OB History     Gravida  1   Para      Term      Preterm      AB      Living         SAB      IAB      Ectopic      Multiple      Live Births                  Current Medications: Current Meds  Medication Sig   Blood Pressure Monitoring DEVI 1 each by Does not apply route once a week.   Prenatal Vit-Fe Fumarate-FA (MULTIVITAMIN-PRENATAL) 27-0.8 MG TABS tablet Take 1 tablet by mouth  daily at 12 noon.     Allergies:   Patient has no known allergies.   Social History   Socioeconomic History   Marital status: Single    Spouse name: Not on file   Number of children: Not on file   Years of education: Not on file   Highest education level: Not on file  Occupational History   Occupation: Scientist, water quality  Tobacco Use   Smoking status: Former     Types: Cigarettes    Quit date: 02/27/2020    Years since quitting: 1.0   Smokeless tobacco: Former  Scientific laboratory technician Use: Former   Devices: not since Magazine features editor and Sexual Activity   Alcohol use: Not Currently    Alcohol/week: 1.0 standard drink    Types: 1 Glasses of wine per week    Comment: not since preg   Drug use: No    Types: Marijuana    Comment: no drugs since 2011   Sexual activity: Yes    Birth control/protection: None  Other Topics Concern   Not on file  Social History Narrative   Trying to get a job with [employer]    Currently working as Scientist, water quality at [employer]    No kids   Social Determinants of Radio broadcast assistant Strain: Not on file  Food Insecurity: Food Insecurity Present   Worried About Charity fundraiser in the Last Year: Sometimes true   Arboriculturist in the Last Year: Never true  Transportation Needs: No Transportation Needs   Lack of Transportation (Medical): No   Lack of Transportation (Non-Medical): No  Physical Activity: Not on file  Stress: Not on file  Social Connections: Not on file      Family History  Adopted: Yes      ROS:   Please see the history of present illness.     All other systems reviewed and are negative.   Labs/EKG Reviewed:    EKG:   EKG is was not ordered today.    Recent Labs: 02/27/2021: Hemoglobin 12.2; Platelets 194   Recent Lipid Panel No results found for: CHOL, TRIG, HDL, CHOLHDL, LDLCALC, LDLDIRECT  Physical Exam:    VS:  BP 100/72   Pulse 72   Ht 5\' 5"  (1.651 m)   Wt 195 lb (88.5 kg)   LMP 07/30/2020 (Exact Date)   SpO2 98%   BMI 32.45 kg/m     Wt Readings from Last 3 Encounters:  03/17/21 195 lb (88.5 kg)  03/12/21 191 lb (86.6 kg)  03/11/21 191 lb 8 oz (86.9 kg)     GEN:  Well nourished, well developed in no acute distress HEENT: Normal NECK: No JVD; No carotid bruits LYMPHATICS: No lymphadenopathy CARDIAC: RRR, no murmurs,  rubs, gallops RESPIRATORY:  Clear to auscultation without rales, wheezing or rhonchi  ABDOMEN: Soft, non-tender, non-distended MUSCULOSKELETAL:  No edema; No deformity  SKIN: Warm and dry NEUROLOGIC:  Alert and oriented x 3 PSYCHIATRIC:  Normal affect    Risk Assessment/Risk Calculators:     CARPREG II Risk Prediction Index Score:  4.  The patient's risk for a primary cardiac event is 22%.   Modified World Health Organization [facility]) Classification of Maternal CV Risk   Class II         ASSESSMENT & PLAN:   Hypertrophic cardiomyopathy Coronary myocardial bridging ICD in situ Supervision of high-risk pregnancy  She is asymptomatic and denies any  shortness of breath, chest pain, lightheadedness or dizziness.  She is fully aware of warning signs and symptoms associated with her HCM.  I have educated the patient heavily that during this time it is very imperative that she avoid dehydration and also notify us if she starts to experience any worsening shortness of breath or chest pain and significant palpitations.  Because with her underlying condition and her current pregnancy state volume status changes could cause aggravating symptoms that could potentially lead to LVOT obstruction as well as heart failure.  If she becomes symptomatic with significant palpitations or worsening shortness of breath or chest pain consideration for low-dose beta-blocker (Toprol-XL 12.5 mg was ) will be appropriate.  Her last echocardiogram was done in 2018, therefore clinically will be in her best interest to get a repeat echocardiogram to understand LVEF and any resting LVOT gradient.  In the meantime the patient has some questions about delivery options.  From a cardiovascular standpoint vaginal delivery is preferred unless C-section is indicated by our obstetrical colleagues.  She has a planned consultation with anesthesia as well.  Her echocardiogram will also help [place] with understanding if there is any  LVOT obstruction which will also be helpful in understanding and planning for epidural versus spinal anesthesia but will defer to anesthesia for these decisions.  As noted during the time of delivery is really managing her fluid shifts and avoid any increase LVOT obstruction.  If there is a need for use of diuretics in the antepartum or postpartum.  This can be used judiciously with close monitoring.  Her ICD continues to be interrogated by our EP partners.  Most recent review no evidence of any arrhythmia.  Fetal ultrasound is being planned.  She understands her hypertrophic cardiomyopathy is a genetic condition and has also discussed this with her spouse and the father of her child.  Patient Instructions  Medication Instructions:  Your physician recommends that you continue on your current medications as directed. Please refer to the Current Medication list given to you today.  *If you need a refill on your cardiac medications before your next appointment, please call your pharmacy*   Lab Work: None If you have labs (blood work) drawn today and your tests are completely normal, you will receive your results only by: [place] (if you have MyChart) OR A paper copy in the mail If you have any lab test that is abnormal or we need to change your treatment, we will call you to review the results.   Testing/Procedures: Your physician has requested that you have an echocardiogram. Echocardiography is a painless test that uses sound waves to create images of your heart. It provides your doctor with information about the size and shape of your heart and how well your heart's chambers and valves are working. This procedure takes approximately one hour. There are no restrictions for this procedure.    Follow-Up: At [facility], you and your health needs are our priority.  As part of our continuing mission to provide you with exceptional heart care, we have created designated Provider Care  Teams.  These Care Teams include your primary Cardiologist (physician) and Advanced Practice Providers (APPs -  Physician Assistants and Nurse Practitioners) who all work together to provide you with the care you need, when you need it.  We recommend signing up for the patient portal called "MyChart".  Sign up information is provided on this After Visit Summary.  MyChart is used to connect with patients for Virtual  Visits (Telemedicine).  Patients are able to view lab/test results, encounter notes, upcoming appointments, etc.  Non-urgent messages can be sent to your provider as well.   To learn more about what you can do with MyChart, go to [email or web address].    Your next appointment:   January 2023  The format for your next appointment:   In Person  Provider:   Thomasene Ripple, DO   Other Instructions  Echocardiogram An echocardiogram is a test that uses sound waves (ultrasound) to produce images of the heart. Images from an echocardiogram can provide important information about: Heart size and shape. The size and thickness and movement of your heart's walls. Heart muscle function and strength. Heart valve function or if you have stenosis. Stenosis is when the heart valves are too narrow. If blood is flowing backward through the heart valves (regurgitation). A tumor or infectious growth around the heart valves. Areas of heart muscle that are not working well because of poor blood flow or injury from a heart attack. Aneurysm detection. An aneurysm is a weak or damaged part of an artery wall. The wall bulges out from the normal force of blood pumping through the body. Tell a health care provider about: Any allergies you have. All medicines you are taking, including vitamins, herbs, eye drops, creams, and over-the-counter medicines. Any blood disorders you have. Any surgeries you have had. Any medical conditions you have. Whether you are pregnant or may be pregnant. What are the  risks? Generally, this is a safe test. However, problems may occur, including an allergic reaction to dye (contrast) that may be used during the test. What happens before the test? No specific preparation is needed. You may eat and drink normally. What happens during the test?  You will take off your clothes from the waist up and put on a hospital gown. Electrodes or electrocardiogram (ECG)patches may be placed on your chest. The electrodes or patches are then connected to a device that monitors your heart rate and rhythm. You will lie down on a table for an ultrasound exam. A gel will be applied to your chest to help sound waves pass through your skin. A handheld device, called a transducer, will be pressed against your chest and moved over your heart. The transducer produces sound waves that travel to your heart and bounce back (or "echo" back) to the transducer. These sound waves will be captured in real-time and changed into images of your heart that can be viewed on a video monitor. The images will be recorded on a computer and reviewed by your health care provider. You may be asked to change positions or hold your breath for a short time. This makes it easier to get different views or better views of your heart. In some cases, you may receive contrast through an IV in one of your veins. This can improve the quality of the pictures from your heart. The procedure may vary among health care providers and hospitals. What can I expect after the test? You may return to your normal, everyday life, including diet, activities, and medicines, unless your health care provider tells you not to do that. Follow these instructions at home: It is up to you to get the results of your test. Ask your health care provider, or the department that is doing the test, when your results will be ready. Keep all follow-up visits. This is important. Summary An echocardiogram is a test that uses sound waves (ultrasound)  to produce images of  the heart. Images from an echocardiogram can provide important information about the size and shape of your heart, heart muscle function, heart valve function, and other possible heart problems. You do not need to do anything to prepare before this test. You may eat and drink normally. After the echocardiogram is completed, you may return to your normal, everyday life, unless your health care provider tells you not to do that. This information is not intended to replace advice given to you by your health care provider. Make sure you discuss any questions you have with your health care provider. Document Revised: 12/31/2020 Document Reviewed: 12/11/2019 Elsevier Patient Education  [place]:  No follow-ups on file.   Medication Adjustments/Labs and Tests Ordered: Current medicines are reviewed at length with the patient today.  Concerns regarding medicines are outlined above.  Tests Ordered: Orders Placed This Encounter  Procedures   EKG 12-Lead   ECHOCARDIOGRAM COMPLETE   Medication Changes: No orders of the defined types were placed in this encounter.

## 2021-03-25 ENCOUNTER — Ambulatory Visit: Payer: Medicaid Other | Admitting: *Deleted

## 2021-03-25 ENCOUNTER — Ambulatory Visit: Payer: Medicaid Other | Attending: Obstetrics

## 2021-03-25 ENCOUNTER — Encounter: Payer: Self-pay | Admitting: *Deleted

## 2021-03-25 ENCOUNTER — Other Ambulatory Visit: Payer: Self-pay

## 2021-03-25 VITALS — BP 111/69 | HR 90

## 2021-03-25 DIAGNOSIS — Z3A33 33 weeks gestation of pregnancy: Secondary | ICD-10-CM | POA: Diagnosis not present

## 2021-03-25 DIAGNOSIS — I252 Old myocardial infarction: Secondary | ICD-10-CM | POA: Diagnosis not present

## 2021-03-25 DIAGNOSIS — O0990 Supervision of high risk pregnancy, unspecified, unspecified trimester: Secondary | ICD-10-CM

## 2021-03-25 DIAGNOSIS — I422 Other hypertrophic cardiomyopathy: Secondary | ICD-10-CM | POA: Insufficient documentation

## 2021-03-25 DIAGNOSIS — O2693 Pregnancy related conditions, unspecified, third trimester: Secondary | ICD-10-CM

## 2021-03-25 DIAGNOSIS — I499 Cardiac arrhythmia, unspecified: Secondary | ICD-10-CM | POA: Insufficient documentation

## 2021-03-30 ENCOUNTER — Other Ambulatory Visit: Payer: Self-pay | Admitting: *Deleted

## 2021-03-30 DIAGNOSIS — I421 Obstructive hypertrophic cardiomyopathy: Secondary | ICD-10-CM

## 2021-03-30 DIAGNOSIS — Z8679 Personal history of other diseases of the circulatory system: Secondary | ICD-10-CM

## 2021-03-30 DIAGNOSIS — I252 Old myocardial infarction: Secondary | ICD-10-CM

## 2021-03-30 DIAGNOSIS — Z95810 Presence of automatic (implantable) cardiac defibrillator: Secondary | ICD-10-CM

## 2021-03-31 ENCOUNTER — Other Ambulatory Visit: Payer: Self-pay

## 2021-03-31 ENCOUNTER — Encounter: Payer: Self-pay | Admitting: Family Medicine

## 2021-03-31 ENCOUNTER — Ambulatory Visit (INDEPENDENT_AMBULATORY_CARE_PROVIDER_SITE_OTHER): Payer: Medicaid Other | Admitting: Family Medicine

## 2021-03-31 VITALS — BP 96/61 | HR 84 | Wt 203.0 lb

## 2021-03-31 DIAGNOSIS — Z8674 Personal history of sudden cardiac arrest: Secondary | ICD-10-CM

## 2021-03-31 DIAGNOSIS — Q245 Malformation of coronary vessels: Secondary | ICD-10-CM

## 2021-03-31 DIAGNOSIS — Z7189 Other specified counseling: Secondary | ICD-10-CM

## 2021-03-31 DIAGNOSIS — Z95810 Presence of automatic (implantable) cardiac defibrillator: Secondary | ICD-10-CM

## 2021-03-31 DIAGNOSIS — I421 Obstructive hypertrophic cardiomyopathy: Secondary | ICD-10-CM

## 2021-03-31 DIAGNOSIS — O0990 Supervision of high risk pregnancy, unspecified, unspecified trimester: Secondary | ICD-10-CM

## 2021-03-31 NOTE — Progress Notes (Signed)
   Subjective:  Sara Lopez is a 25 y.o. G1P0 at [redacted]w[redacted]d being seen today for ongoing prenatal care.  She is currently monitored for the following issues for this high-risk pregnancy and has History of MI (myocardial infarction); HOCM (hypertrophic obstructive cardiomyopathy) (HCC); History of cardiac arrest; Coronary-myocardial bridge; ADHD (attention deficit hyperactivity disorder); Cardiac pacemaker in situ; Depression; Developmental delay; Heart disease; VF (ventricular fibrillation) (HCC); Single implantable cardioverter-defibrillator (ICD) in situ; Supervision of high risk pregnancy, antepartum; and Red Chart Rounds Patient on their problem list.  Patient reports no complaints.  Contractions: Not present. Vag. Bleeding: None.  Movement: Present. Denies leaking of fluid.   The following portions of the patient's history were reviewed and updated as appropriate: allergies, current medications, past family history, past medical history, past social history, past surgical history and problem list. Problem list updated.  Objective:   Vitals:   03/31/21 1432  BP: 96/61  Pulse: 84  Weight: 203 lb (92.1 kg)    Fetal Status: Fetal Heart Rate (bpm): 159   Movement: Present     General:  Alert, oriented and cooperative. Patient is in no acute distress.  Skin: Skin is warm and dry. No rash noted.   Cardiovascular: Normal heart rate noted  Respiratory: Normal respiratory effort, no problems with respiration noted  Abdomen: Soft, gravid, appropriate for gestational age. Pain/Pressure: Present     Pelvic: Vag. Bleeding: None     Cervical exam deferred        Extremities: Normal range of motion.  Edema: None  Mental Status: Normal mood and affect. Normal behavior. Normal judgment and thought content.   Urinalysis:      Assessment and Plan:  Pregnancy: G1P0 at [redacted]w[redacted]d  1. Supervision of high risk pregnancy, antepartum BP and FHR normal  2. HOCM (hypertrophic obstructive cardiomyopathy)  (HCC) Following w Cardio OB, last saw Dr. Servando Salina on 03/17/21 Beta blocker if becomes symptomatic, avoid diuretics especially postpartum, needs to see EP for follow up Anesthesia consult pending Has TTE scheduled for 04/03/2021 Also has fetal echo scheduled for 04/09/21 Reports feeling tired but no chest pain or exertional dyspnea  3. Coronary-myocardial bridge   4. Single implantable cardioverter-defibrillator (ICD) in situ   5. History of cardiac arrest   6. Red Chart Rounds Patient   Preterm labor symptoms and general obstetric precautions including but not limited to vaginal bleeding, contractions, leaking of fluid and fetal movement were reviewed in detail with the patient. Please refer to After Visit Summary for other counseling recommendations.  Return in 2 weeks (on 04/14/2021).   Venora Maples, MD

## 2021-03-31 NOTE — Patient Instructions (Signed)

## 2021-04-03 ENCOUNTER — Ambulatory Visit (HOSPITAL_COMMUNITY): Payer: Medicaid Other | Attending: Cardiology

## 2021-04-03 ENCOUNTER — Other Ambulatory Visit: Payer: Self-pay

## 2021-04-03 DIAGNOSIS — I421 Obstructive hypertrophic cardiomyopathy: Secondary | ICD-10-CM | POA: Diagnosis not present

## 2021-04-03 LAB — ECHOCARDIOGRAM COMPLETE
Area-P 1/2: 5.54 cm2
MV M vel: 4.39 m/s
MV Peak grad: 77.1 mmHg
S' Lateral: 2.95 cm

## 2021-04-09 DIAGNOSIS — Q250 Patent ductus arteriosus: Secondary | ICD-10-CM | POA: Diagnosis not present

## 2021-04-09 DIAGNOSIS — Q2112 Patent foramen ovale: Secondary | ICD-10-CM | POA: Diagnosis not present

## 2021-04-23 ENCOUNTER — Ambulatory Visit: Payer: Medicaid Other | Admitting: *Deleted

## 2021-04-23 ENCOUNTER — Other Ambulatory Visit: Payer: Self-pay

## 2021-04-23 ENCOUNTER — Other Ambulatory Visit (HOSPITAL_COMMUNITY)
Admission: RE | Admit: 2021-04-23 | Discharge: 2021-04-23 | Disposition: A | Discharge status: Home / Self Care | Payer: Medicaid Other | Source: Ambulatory Visit | Attending: Family Medicine | Admitting: Family Medicine

## 2021-04-23 ENCOUNTER — Ambulatory Visit: Payer: Medicaid Other | Attending: Obstetrics and Gynecology

## 2021-04-23 ENCOUNTER — Ambulatory Visit (INDEPENDENT_AMBULATORY_CARE_PROVIDER_SITE_OTHER): Payer: Medicaid Other | Admitting: Obstetrics and Gynecology

## 2021-04-23 VITALS — BP 102/61 | HR 80 | Wt 200.6 lb

## 2021-04-23 VITALS — BP 108/68 | HR 75

## 2021-04-23 DIAGNOSIS — O0990 Supervision of high risk pregnancy, unspecified, unspecified trimester: Secondary | ICD-10-CM | POA: Insufficient documentation

## 2021-04-23 DIAGNOSIS — Q245 Malformation of coronary vessels: Secondary | ICD-10-CM

## 2021-04-23 DIAGNOSIS — Z95810 Presence of automatic (implantable) cardiac defibrillator: Secondary | ICD-10-CM

## 2021-04-23 DIAGNOSIS — Z3A37 37 weeks gestation of pregnancy: Secondary | ICD-10-CM | POA: Diagnosis not present

## 2021-04-23 DIAGNOSIS — I252 Old myocardial infarction: Secondary | ICD-10-CM

## 2021-04-23 DIAGNOSIS — I421 Obstructive hypertrophic cardiomyopathy: Secondary | ICD-10-CM

## 2021-04-23 DIAGNOSIS — O99891 Other specified diseases and conditions complicating pregnancy: Secondary | ICD-10-CM | POA: Diagnosis not present

## 2021-04-23 DIAGNOSIS — Z8679 Personal history of other diseases of the circulatory system: Secondary | ICD-10-CM | POA: Diagnosis not present

## 2021-04-23 DIAGNOSIS — Z7189 Other specified counseling: Secondary | ICD-10-CM

## 2021-04-23 LAB — OB RESULTS CONSOLE GC/CHLAMYDIA: Gonorrhea: NEGATIVE

## 2021-04-23 NOTE — Progress Notes (Signed)
° °  PRENATAL VISIT NOTE  Subjective:  Sara Lopez is a 25 y.o. G1P0 at [redacted]w[redacted]d being seen today for ongoing prenatal care.  She is currently monitored for the following issues for this high-risk pregnancy and has History of MI (myocardial infarction); HOCM (hypertrophic obstructive cardiomyopathy) (HCC); History of cardiac arrest; Coronary-myocardial bridge; ADHD (attention deficit hyperactivity disorder); Cardiac pacemaker in situ; Depression; Developmental delay; Heart disease; VF (ventricular fibrillation) (HCC); Single implantable cardioverter-defibrillator (ICD) in situ; Supervision of high risk pregnancy, antepartum; Red Chart Rounds Patient; and [redacted] weeks gestation of pregnancy on their problem list.  Patient doing well with no acute concerns today. She reports no complaints.  Contractions: Not present. Vag. Bleeding: None.  Movement: Increased. Denies leaking of fluid.   The following portions of the patient's history were reviewed and updated as appropriate: allergies, current medications, past family history, past medical history, past social history, past surgical history and problem list. Problem list updated.  Objective:   Vitals:   04/23/21 1616  BP: 102/61  Pulse: 80  Weight: 200 lb 9.6 oz (91 kg)    Fetal Status: Fetal Heart Rate (bpm): 137 Fundal Height: 38 cm Movement: Increased     General:  Alert, oriented and cooperative. Patient is in no acute distress.  Skin: Skin is warm and dry. No rash noted.   Cardiovascular: Normal heart rate noted  Respiratory: Normal respiratory effort, no problems with respiration noted  Abdomen: Soft, gravid, appropriate for gestational age.  Pain/Pressure: Absent     Pelvic: Cervical exam performed Dilation: Closed Effacement (%): 40 Station: -3  Extremities: Normal range of motion.     Mental Status:  Normal mood and affect. Normal behavior. Normal judgment and thought content.   Assessment and Plan:  Pregnancy: G1P0 at [redacted]w[redacted]d  1.  [redacted] weeks gestation of pregnancy   2. HOCM (hypertrophic obstructive cardiomyopathy) (HCC) Pt still followed by OB cards, pt denies chest pain, palpitations or SOB  3. Coronary-myocardial bridge   4. History of MI (myocardial infarction)   5. Red Chart Rounds Patient Spoke with Dr. Adrian Blackwater this AM.  He made me aware that the patient is scheduled for IOL on 05/05/2021 with anaesthesia aware along with ICU and cardiology teams.  She will likely need an Art line.  Currently working out specifics for actual IOL.  Pt is aware of IOL and notes compliance.  6. Supervision of high risk pregnancy, antepartum Otherwise continue routine prenatal care. Growth scan done today, formal read not currently on the chart. - Culture, beta strep (group b only) - Cervicovaginal ancillary only( [place])  Term labor symptoms and general obstetric precautions including but not limited to vaginal bleeding, contractions, leaking of fluid and fetal movement were reviewed in detail with the patient.  Please refer to After Visit Summary for other counseling recommendations.   Return in about 1 week (around 04/30/2021) for [facility], in person.   Mariel Aloe, MD Faculty Attending [facility]

## 2021-04-24 LAB — CERVICOVAGINAL ANCILLARY ONLY
Chlamydia: NEGATIVE
Comment: NEGATIVE
Comment: NORMAL
Neisseria Gonorrhea: NEGATIVE

## 2021-04-26 LAB — CULTURE, BETA STREP (GROUP B ONLY): Strep Gp B Culture: NEGATIVE

## 2021-04-26 NOTE — Progress Notes (Signed)
° °  PRENATAL VISIT NOTE  Subjective:  Sara Lopez is a 25 y.o. G1P0 at [redacted]w[redacted]d being seen today for ongoing prenatal care.  She is currently monitored for the following issues for this high-risk pregnancy and has History of MI (myocardial infarction); HOCM (hypertrophic obstructive cardiomyopathy) (HCC); History of cardiac arrest; Coronary-myocardial bridge; ADHD (attention deficit hyperactivity disorder); Cardiac pacemaker in situ; Depression; Developmental delay; Heart disease; VF (ventricular fibrillation) (HCC); Single implantable cardioverter-defibrillator (ICD) in situ; Supervision of high risk pregnancy, antepartum; and Red Chart Rounds Patient on their problem list.  Patient reports no complaints.  Contractions: Not present. Vag. Bleeding: None.  Movement: Present. Denies leaking of fluid.   The following portions of the patient's history were reviewed and updated as appropriate: allergies, current medications, past family history, past medical history, past social history, past surgical history and problem list.   Objective:   Vitals:   04/28/21 1633  BP: 106/70  Pulse: 68  Weight: 202 lb 8 oz (91.9 kg)    Fetal Status: Fetal Heart Rate (bpm): 143   Movement: Present     General:  Alert, oriented and cooperative. Patient is in no acute distress.  Skin: Skin is warm and dry. No rash noted.   Cardiovascular: Normal heart rate noted  Respiratory: Normal respiratory effort, no problems with respiration noted  Abdomen: Soft, gravid, appropriate for gestational age.  Pain/Pressure: Present     Pelvic: Cervical exam deferred        Extremities: Normal range of motion.  Edema: None  Mental Status: Normal mood and affect. Normal behavior. Normal judgment and thought content.   Assessment and Plan:  Pregnancy: G1P0 at [redacted]w[redacted]d 1. HOCM (hypertrophic obstructive cardiomyopathy) (HCC) Fetus with mildly hypertrophied R ventricle. Plan suggested by [facility] is postnatal echo and ECG before d/c.   Pt's most recent echo on 12/2 was stable  2. Coronary-myocardial bridge Avoid diuretics and hypotension in labor Telemetry with remote read in labor If concern for stability, Aline would be placed. It is also possible the labor in this setting would then be in the ICU Coordinated IOL with all teams involved is 1/3.   3. Cardiac pacemaker in situ   4. Supervision of high risk pregnancy, antepartum Normal growth on 12/23 at 71%ile, 3310g GBS neg  5. History of MI (myocardial infarction)   Term labor symptoms and general obstetric precautions including but not limited to vaginal bleeding, contractions, leaking of fluid and fetal movement were reviewed in detail with the patient. Please refer to After Visit Summary for other counseling recommendations.   No follow-ups on file.  Future Appointments  Date Time Provider Department Center  05/05/2021  6:30 AM MC-LD SCHED ROOM [facility]-INDC None  05/18/2021 10:00 AM [facility] DEVICE REMOTES [facility]  05/25/2021  9:00 AM Tobb, Kardie, DO [facility]  08/17/2021 10:00 AM [facility] DEVICE REMOTES [facility]  11/16/2021 10:00 AM [facility] DEVICE REMOTES [facility]  02/15/2022 10:00 AM [facility] DEVICE REMOTES [facility]    Milas Hock, MD

## 2021-04-27 ENCOUNTER — Telehealth (HOSPITAL_COMMUNITY): Payer: Self-pay | Admitting: *Deleted

## 2021-04-27 NOTE — Telephone Encounter (Signed)
Preadmission screen  

## 2021-04-28 ENCOUNTER — Other Ambulatory Visit: Payer: Self-pay

## 2021-04-28 ENCOUNTER — Ambulatory Visit (INDEPENDENT_AMBULATORY_CARE_PROVIDER_SITE_OTHER): Payer: Medicaid Other | Admitting: Obstetrics and Gynecology

## 2021-04-28 ENCOUNTER — Telehealth (HOSPITAL_COMMUNITY): Payer: Self-pay | Admitting: *Deleted

## 2021-04-28 ENCOUNTER — Encounter (HOSPITAL_COMMUNITY): Payer: Self-pay | Admitting: *Deleted

## 2021-04-28 VITALS — BP 106/70 | HR 68 | Wt 202.5 lb

## 2021-04-28 DIAGNOSIS — Z7189 Other specified counseling: Secondary | ICD-10-CM

## 2021-04-28 DIAGNOSIS — I421 Obstructive hypertrophic cardiomyopathy: Secondary | ICD-10-CM

## 2021-04-28 DIAGNOSIS — O0990 Supervision of high risk pregnancy, unspecified, unspecified trimester: Secondary | ICD-10-CM | POA: Diagnosis not present

## 2021-04-28 DIAGNOSIS — I252 Old myocardial infarction: Secondary | ICD-10-CM | POA: Diagnosis not present

## 2021-04-28 DIAGNOSIS — Z950 Presence of cardiac pacemaker: Secondary | ICD-10-CM | POA: Diagnosis not present

## 2021-04-28 DIAGNOSIS — Q245 Malformation of coronary vessels: Secondary | ICD-10-CM | POA: Diagnosis not present

## 2021-04-28 NOTE — Patient Instructions (Signed)
AREA PEDIATRIC/FAMILY PRACTICE PHYSICIANS  Central/[place]outheast [place] ([address]) [facility] Chambliss, MD; Eniola, MD; Hale, MD; Hensel, MD; McDiarmid, MD; McIntyer, MD; Neal, MD; Walden, MD [address] [place], [place] [address] [phone] Mon-Fri 8:30-12:30, 1:30-5:00 Providers come to see babies at [facility] Accepting Medicaid [facility] providers who accept newborns: Koirala, MD; Morrow, MD; Wolters, MD [address], [place], [place] [address] [phone] Mon-Fri 8:00-5:30 Babies seen by providers at [facility] Does NOT accept Medicaid Please call early in hospitalization for appointment (limited availability)  [facility] Mulberry, MD [address] [place], [place] [address] [phone] Mon, Tue, Thur, Fri 8:30-5:00, Wed 10:00-7:00 (closed 1-2pm) Babies seen by [facility] providers Accepting Medicaid Rubin - Pediatrician Rubin, MD [address], [place], [place] [address] [phone] Mon-Fri 8:30-5:00, Sat 8:30-12:00 Provider comes to see babies at [facility] Accepting Medicaid Must have been referred from current patients or contacted office prior to delivery [facility] ([facility]) Brown, MD; Chandler, MD; Ettefagh, MD; Grant, MD; Lester, MD; McCormick, MD; McQueen, MD; Prose, MD; Simha, MD; Stanley, MD; Stryffeler, NP; Tebben, NP [address], [place], [place] [address] [phone] Mon, Tue, Thur, Fri 8:30-5:30, Wed 9:30-5:30, Sat 8:30-12:30 Babies seen by [facility] providers Accepting Medicaid Only accepting infants of first-time parents or siblings of current patients Hospital discharge coordinator will make follow-up appointment Jack Amos [address], [place], [place]  [address] [phone]   Fax - [phone] [facility] [address], [place], [place]  [address] Phone - [phone]   Fax - [phone] Shilpa Gosrani [address], [place], [place]  [address] [phone]  East/[place]ortheast [place] ([address]) [facility] Bates, MD; [place], MD; Cooper, Cox, MD; MD; Davis, MD; Dovico, MD; Ettefaugh, MD; Little, MD; Lowe, MD; Keiffer, MD; Melvin, MD; Sumner, MD; Williams, MD [address], [place], [place] [address] [phone] Mon-Fri 8:30-5:00 (extended evenings Mon-Thur as needed), Sat-Sun 10:00-1:00 Providers come to see babies at [facility] Accepting Medicaid for families of first-time babies and families with all children in the household age 3 and under. Must register with office prior to making appointment (M-F only). [facility] Henson, NP; Knapp, MD; Lalonde, MD; Tysinger, PA [address] [place], [place] [address] [phone] Mon-Fri 8:00-5:00 Babies seen by providers at [facility] Does NOT accept Medicaid/Commercial Insurance Only [facility] ([facility])  Artis, MD; Barnes, MD; Bratton, MD; Coccaro, MD; Lockett Gardner, MD; Kramer, MD; Marshall, MD; Netherton, MD; Poleto, MD; Skinner, MD [address] [place], [place] [address] [phone] Mon-Fri 8:30-5:30, Sat (Oct.-Mar.) 9:00-1:00 Babies seen by providers at [facility] Accepting Medicaid  [place] ([address]) [facility] Reid, MD; Warner, MD [address], [place], [place] [address] [phone] Mon-Fri 8:30-5:00, Sat 8:30-12:00 Providers come to see babies at [facility] Does NOT accept Medicaid [facility] Becker, PA; Hagler, MD; Scifres, PA; Sun, MD; Swayne, MD [address], , [place] [address] [phone] Mon-Fri 8:00-5:00 Babies seen by providers at [facility] Does NOT accept Medicaid Only accepting babies of parents who  are patients Please call early in hospitalization for appointment (limited availability)  Pediatricians Clark, MD; Frye, MD; Kelleher, MD; Mack, NP; Miller, MD; O'Keller, MD; Patterson, NP; Pudlo, MD; Puzio, MD; Thomas, MD; Tucker, MD; Twiselton, MD [address], [place], [place] [address] [phone] Mon-Fri 8:00-5:00, Sat 9:00-12:00 Providers come to see babies at [facility] Does NOT accept Medicaid  [place] ([address]) [facility] providers accepting new patients: Brake, NP; Wharton, PA [address], [place], [place] [address] [phone] Mon-Fri 8:00-5:00 Babies seen by providers at [facility] Does NOT accept Medicaid Only accepting babies of parents who are patients Please call early in hospitalization for appointment (limited availability) [facility] Gay, MD; Quinlan, MD [address] [place], [place] [address] [phone] (press 1 to schedule appointment) Mon-Fri 8:00-5:00 Providers come to see babies at [facility] Does NOT accept Medicaid [facility] Mazer, MD [address] [place], [place] [address] [phone] Mon-Fri 8:30-5:00 (lunch 12:30-1:00), extended hours by appointment only Wed 5:00-6:30 Babies seen by [facility] providers Accepting Medicaid [facility] Banks, MD; Jordan, MD; Koberlein, MD [address], [place], [place] [address] [phone] Mon-Fri 8:00-5:00 Babies seen by [facility] providers Does NOT accept Medicaid [facility] Parker, MD; Hunter, MD; Wallace, DO [address] [place], [place] [address] [phone] Mon-Fri 8:00-5:00 Babies seen by [facility] providers Does NOT accept Medicaid [facility] Brandon, PA; Brecken, PA; Christy, NP; Dees, MD; DeClaire, MD; DeWeese, MD; Hansen, NP; Mills, NP; Parrish, NP; Smoot, NP; Summer, MD; Vapne,  MD [address] [place], [place] [address] [phone] Mon-Fri 8:30-5:00, Sat 10:00-1:00 Providers come to see babies at [facility] Does NOT accept Medicaid Free prenatal information session Tuesdays at 4:45pm [facility] Bouska, MD; Gordon, PA; Jeffery, PA; Weber, PA [address] [place] [address] [phone] Mon-Fri 7:30-5:30 Babies seen by [facility] providers [facility] [address], [place], [place]  [address] [phone]   Fax - [phone]  [place] ([address] & [address]) [facility] [place] [address] [place], [place] [address] [phone] Mon-Thur 8:00-6:00 Providers come to see babies at [facility] Accepting Medicaid [facility] Anderson, NP; Badger, MD; Beal, PA; Spencer, PA [address] [place], [place] [address] [phone] Mon-Thur 7:30-7:30, Fri 7:30-4:30 Babies seen by [facility] providers Accepting Medicaid [facility] Agbuya, MD; Klett, NP; Romgoolam, MD [address], [place], [place] [address] [phone] Mon-Fri 8:30-5:00, Sat 8:30-12:00 Providers come to see babies at [facility] Accepting Medicaid Must have "Meet & Greet" appointment at office prior to delivery [facility] ([facility]) McCord, MD; Wallace, MD; Wood, MD [address], [place], [place] [address] [phone] Mon-Wed 8:00-6:00, Thur-Fri 8:00-5:00, Sat 9:00-12:00 Providers come to see babies at [facility] Does NOT accept Medicaid Only accepting siblings of current patients [facility]  [address], [place], [place]  [address] [phone]   Fax - [phone] [facility] [address], [place], [place]  [address] [phone]   Fax -  [phone]  [place] ([address] & [address]) [facility] Cirigliano, DO; Matthews, DO [address] [place], [place] [address] [phone] Mon-Fri 7:00-5:00 Babies seen by [facility] providers Does NOT accept Medicaid [facility] Briscoe, MD; Howley, PA; Moreira, PA [address], [place], [place] [address] [phone] Mon-Fri 8:00-5:00 Babies seen by [facility] providers Accepting Medicaid [facility] Boyd, MD; Church, PA; Jones, NP; Osborn, PA [address], [place], [place] [address] [phone] Mon-Fri 8:00-5:00 Babies seen by providers at [facility] Accepting Medicaid  [place] ([address]) [facility] [place] Wend[place], DO [address] [place], [place] [address] [phone] Mon-Fri 8:00-5:00 Babies seen by [facility] providers Does NOT accept Medicaid Limited availability, please call early in hospitalization to schedule follow-up [facility] Calderon, PA; Cummings, MD; Dillard, MD; Martin, PA; Olson, MD; VanDeven, PA [address], [place], [place] [address] [phone] Mon-Fri 8:30-5:00, Sat 9:00-12:00 Babies seen by providers at [facility] Accepting Medicaid Please register online then schedule online or call office [email or web address] Hunter, NP; Kumar, MD; Martin [place], PA [address], [place], [place] [address] [phone] Mon-Fri 8:00-5:00 Babies seen by providers at [facility] Accepting Medicaid [facility] ([facility]) Southmayd, MD; Kristi Fleenor, NP; West, MD [address], [place], [place] [address] [phone] Mon-Fri 8:00-5:30, Sat&Sun by appointment (phones open at  8:30) Babies seen by [facility] providers Accepting Medicaid Must be a first-time baby or sibling of current patient [facility] [place]  [address], [place], [place]  [address] [phone]   Fax - [phone]  [place] ([address]) [facility] Brown, PA; Cowen, PA; Rice, MD; Helton, PA; Spry, MD [address] [place], [place] [address] [phone] Mon-Thur 8:00-7:00, Fri 8:00-5:00, Sat 8:00-12:00, Sun 9:00-12:00 Babies seen by [facility] providers Accepting Medicaid [facility] Coe-Goins, MD; Marshall, MD; Pierre-Louis, MD [address], [place], [place] [address] [phone] Mon-Thur 8:00-5:00 Babies seen by providers at [facility] Accepting Medicaid [facility] Bratton, MD; Coe-Goins, MD; Hayes, MD; Lewis, MD; List, MD; Lott, MD; Marshall, MD; Moran, MD; O'Neal, MD; Pierre-Louis, MD; Pitonzo, MD; Scholer, MD; Spangle, MD [address] [place], [place] [address] [phone] Mon-Fri 8:00-5:30, Sat (Oct.-Mar.) 9:00-1:00 Babies seen by providers at [facility] Accepting Medicaid Must fill out new patient packet, available online at [email or web address] [facility] ([facility]) Friddle, NP; Harris, NP; Kelly, NP; Logan, MD; Melvin, PA; Poth, MD; Ramadoss, MD; Stanton, NP [address], [place], [place] [address] [phone] Mon-Thur 8:00-5:30, Fri 8:00-5:00 Babies seen by providers at [facility] Accepting Medicaid  [place] ([address]) [facility] Dixon, PA; , MD; Pickard, MD; Tapia, PA [address], [place], [place] [address] [phone] Mon-Fri 8:00-5:00 Babies seen by providers at [facility] Accepting Medicaid   [place] ([address]) [facility] [place] Masneri, DO; Meyers, MD; Nelson, PA [address], [place], [place] [address] [phone] Mon-Fri 8:00-5:00 Babies seen by providers at [facility] Does NOT accept Medicaid Limited appointment availability, please call early in hospitalization  [facility] [place] Kunedd, DO; McGowen, MD [address], [place], [place] [address] [phone] Mon-Fri 8:00-5:00 Babies seen by [facility] providers Does NOT accept Medicaid [facility] [place] Cameron, MD; MacDonald, MD; Michaels, PA; Nayak, MD [address] [place] [address], [place], [place] [address] [phone] Mon-Fri 8:00-5:00 After hours clinic ([address] [place],  [address]) [phone] Mon-Fri 5:00-8:00, Sat 12:00-6:00, Sun 10:00-4:00 Babies seen by [facility] providers Accepting Medicaid [facility] [place] [address], [place], [place]  [address] [phone]   Fax - [phone]  [place] ([address]) [facility] [place] [facility] Andy, MD [address], [place], [place] [address] [phone] Mon-Fri 8:00-5:00 Babies seen by [facility] providers Does NOT accept Medicaid [facility] [place] ([facility] [place]) Eksir, MD [address], [place], [place] [address] [phone] Mon-Thur 8:00-7:00, Fri 8:00-5:00, Sat 8:00-12:00 Babies seen by providers at [facility] Accepting Medicaid - but does not have vaccinations in office (must be received elsewhere) Limited availability, please call early in hospitalization  [place] ([address]) [facility]  Charlene Flemming, MD [address], [place] [address] [phone]  Fax [phone]  [facility]  Kimberly Newton, MD, Annamarie Streilein, PA, Carla Hampton, PA [address] [place], [place]  [address] [phone] [facility]  [address], [place], [place] [address] [phone] [address], [place], [place] [address] [phone] (West Office)  [facility] [address], [place], [place] [address] [phone] [facility] [address], [place], [place] [address] [phone] [facility] [address], [place], [place] [address] [phone] [facility] [address], [place], [place] [address] [phone] [facility] [address], [place], [place] [address] [phone] [facility] [address], [place], [place] [address] [phone] [facility]  [address], [place], [place] [address] [phone] Dr[facility] [address], [place], [place] [address] [phone] [facility] [address], [place], [place] [address] [phone] [facility] [address], [place], [place] [address] [phone]  

## 2021-04-28 NOTE — Telephone Encounter (Signed)
Preadmission screen  

## 2021-05-03 NOTE — L&D Delivery Note (Signed)
OB/GYN Faculty Practice Delivery Note  Sara Lopez is a 26 y.o. G1P1001 s/p vacuum-assisted vaginal delivery at [redacted]w[redacted]d. She was admitted for IOL due to history of hypertrophic cardiomyopathy.   ROM: 21h 14m with clear fluid GBS Status: Negative   Delivery Date/Time: 05/06/2020 at 1953  Delivery: Called to room and patient was complete and pushing. Patient pushed for about 30 minutes with good effort but started to feel fatigued and mildly short of breath. Given patient's history of HOCM, discussed option for vacuum-assisted vaginal delivery. Risks and benefits of VAVD discussed with patient at bedside. Patient verbally consented.  The MityVac bell vacuum was applied to the fetal head. Initially had one immediate pop off due to poor seal. Appropriate seal was then achieved and 1 pull was performed with 3 pushes, allowing for successful delivery of the fetal head. The head delivered LOA. No nuchal cord present. Shoulder and body delivered in usual fashion. Infant with spontaneous cry, placed on mother's abdomen, dried and stimulated. Cord clamped x 2 after 1-minute delay and cut by FOB under my direct supervision. Cord blood drawn. Placenta delivered spontaneously with gentle cord traction. Fundus firm with massage and Pitocin. TXA given. Labia, perineum, vagina, and cervix were inspected, and patient was found to have bilateral vaginal sidewall lacerations that were repaired with 3-0 Vicryl and found to be hemostatic.   Placenta: Intact, 3VC - sent to L&D Complications: None Lacerations: Bilateral vaginal sidewall lacerations  EBL: 600 cc Analgesia: Epidural   Infant: Viable female   APGARs 8 and 9  Evalina Field, MD OB/GYN Fellow, Faculty Practice

## 2021-05-05 ENCOUNTER — Inpatient Hospital Stay (HOSPITAL_COMMUNITY): Payer: Medicaid Other | Admitting: Anesthesiology

## 2021-05-05 ENCOUNTER — Encounter (HOSPITAL_COMMUNITY): Payer: Self-pay | Admitting: Certified Registered Nurse Anesthetist

## 2021-05-05 ENCOUNTER — Inpatient Hospital Stay (HOSPITAL_COMMUNITY)
Admission: AD | Admit: 2021-05-05 | Discharge: 2021-05-08 | DRG: 805 | Disposition: A | Discharge status: Home / Self Care | Payer: Medicaid Other | Attending: Obstetrics and Gynecology | Admitting: Obstetrics and Gynecology

## 2021-05-05 ENCOUNTER — Other Ambulatory Visit: Payer: Self-pay

## 2021-05-05 ENCOUNTER — Inpatient Hospital Stay (HOSPITAL_COMMUNITY): Payer: Medicaid Other

## 2021-05-05 ENCOUNTER — Encounter (HOSPITAL_COMMUNITY): Payer: Self-pay | Admitting: Family Medicine

## 2021-05-05 DIAGNOSIS — I422 Other hypertrophic cardiomyopathy: Secondary | ICD-10-CM | POA: Diagnosis not present

## 2021-05-05 DIAGNOSIS — Z3A39 39 weeks gestation of pregnancy: Secondary | ICD-10-CM | POA: Diagnosis not present

## 2021-05-05 DIAGNOSIS — Q245 Malformation of coronary vessels: Secondary | ICD-10-CM | POA: Diagnosis not present

## 2021-05-05 DIAGNOSIS — O904 Postpartum acute kidney failure: Secondary | ICD-10-CM | POA: Diagnosis not present

## 2021-05-05 DIAGNOSIS — Z95810 Presence of automatic (implantable) cardiac defibrillator: Secondary | ICD-10-CM | POA: Diagnosis present

## 2021-05-05 DIAGNOSIS — I4901 Ventricular fibrillation: Secondary | ICD-10-CM | POA: Diagnosis not present

## 2021-05-05 DIAGNOSIS — O99284 Endocrine, nutritional and metabolic diseases complicating childbirth: Secondary | ICD-10-CM | POA: Diagnosis present

## 2021-05-05 DIAGNOSIS — O0990 Supervision of high risk pregnancy, unspecified, unspecified trimester: Secondary | ICD-10-CM

## 2021-05-05 DIAGNOSIS — I252 Old myocardial infarction: Secondary | ICD-10-CM | POA: Diagnosis not present

## 2021-05-05 DIAGNOSIS — E871 Hypo-osmolality and hyponatremia: Secondary | ICD-10-CM | POA: Diagnosis not present

## 2021-05-05 DIAGNOSIS — D649 Anemia, unspecified: Secondary | ICD-10-CM | POA: Diagnosis not present

## 2021-05-05 DIAGNOSIS — D62 Acute posthemorrhagic anemia: Secondary | ICD-10-CM | POA: Diagnosis not present

## 2021-05-05 DIAGNOSIS — Z452 Encounter for adjustment and management of vascular access device: Secondary | ICD-10-CM

## 2021-05-05 DIAGNOSIS — Z3490 Encounter for supervision of normal pregnancy, unspecified, unspecified trimester: Secondary | ICD-10-CM

## 2021-05-05 DIAGNOSIS — O9952 Diseases of the respiratory system complicating childbirth: Secondary | ICD-10-CM | POA: Diagnosis not present

## 2021-05-05 DIAGNOSIS — Z20822 Contact with and (suspected) exposure to covid-19: Secondary | ICD-10-CM | POA: Diagnosis not present

## 2021-05-05 DIAGNOSIS — N179 Acute kidney failure, unspecified: Secondary | ICD-10-CM | POA: Diagnosis not present

## 2021-05-05 DIAGNOSIS — O26893 Other specified pregnancy related conditions, third trimester: Secondary | ICD-10-CM | POA: Diagnosis not present

## 2021-05-05 DIAGNOSIS — Z87891 Personal history of nicotine dependence: Secondary | ICD-10-CM

## 2021-05-05 DIAGNOSIS — O9081 Anemia of the puerperium: Secondary | ICD-10-CM | POA: Diagnosis not present

## 2021-05-05 DIAGNOSIS — O9942 Diseases of the circulatory system complicating childbirth: Secondary | ICD-10-CM | POA: Diagnosis not present

## 2021-05-05 DIAGNOSIS — Z950 Presence of cardiac pacemaker: Secondary | ICD-10-CM | POA: Diagnosis present

## 2021-05-05 DIAGNOSIS — I421 Obstructive hypertrophic cardiomyopathy: Secondary | ICD-10-CM | POA: Diagnosis present

## 2021-05-05 DIAGNOSIS — Z8674 Personal history of sudden cardiac arrest: Secondary | ICD-10-CM

## 2021-05-05 LAB — RPR: RPR Ser Ql: NONREACTIVE

## 2021-05-05 LAB — COMPREHENSIVE METABOLIC PANEL
ALT: 12 U/L (ref 0–44)
AST: 25 U/L (ref 15–41)
Albumin: 2.5 g/dL — ABNORMAL LOW (ref 3.5–5.0)
Alkaline Phosphatase: 156 U/L — ABNORMAL HIGH (ref 38–126)
Anion gap: 10 (ref 5–15)
BUN: 6 mg/dL (ref 6–20)
CO2: 18 mmol/L — ABNORMAL LOW (ref 22–32)
Calcium: 8.2 mg/dL — ABNORMAL LOW (ref 8.9–10.3)
Chloride: 106 mmol/L (ref 98–111)
Creatinine, Ser: 0.67 mg/dL (ref 0.44–1.00)
GFR, Estimated: >60 mL/min (ref >60)
Glucose, Bld: 110 mg/dL — ABNORMAL HIGH (ref 70–99)
Potassium: 3.7 mmol/L (ref 3.5–5.1)
Sodium: 134 mmol/L — ABNORMAL LOW (ref 135–145)
Total Bilirubin: 0.5 mg/dL (ref 0.3–1.2)
Total Protein: 5.8 g/dL — ABNORMAL LOW (ref 6.5–8.1)

## 2021-05-05 LAB — CBC
HCT: 31.3 % — ABNORMAL LOW (ref 36.0–46.0)
Hemoglobin: 11 g/dL — ABNORMAL LOW (ref 12.0–15.0)
MCH: 30.8 pg (ref 26.0–34.0)
MCHC: 35.1 g/dL (ref 30.0–36.0)
MCV: 87.7 fL (ref 80.0–100.0)
Platelets: 178 10*3/uL (ref 150–400)
RBC: 3.57 MIL/uL — ABNORMAL LOW (ref 3.87–5.11)
RDW: 13.1 % (ref 11.5–15.5)
WBC: 6.2 10*3/uL (ref 4.0–10.5)
nRBC: 0 % (ref 0.0–0.2)

## 2021-05-05 LAB — RESP PANEL BY RT-PCR (FLU A&B, COVID) ARPGX2
Influenza A by PCR: NEGATIVE
Influenza B by PCR: NEGATIVE
SARS Coronavirus 2 by RT PCR: NEGATIVE

## 2021-05-05 MED ORDER — PHENYLEPHRINE 40 MCG/ML (10ML) SYRINGE FOR IV PUSH (FOR BLOOD PRESSURE SUPPORT)
80.0000 ug | PREFILLED_SYRINGE | INTRAVENOUS | Status: AC | PRN
  Administered 2021-05-05 (×3): 80 ug via INTRAVENOUS
  Filled 2021-05-05: qty 10

## 2021-05-05 MED ORDER — DIPHENHYDRAMINE HCL 50 MG/ML IJ SOLN: 12.5000 mg | INTRAMUSCULAR | Status: DC | PRN

## 2021-05-05 MED ORDER — SOD CITRATE-CITRIC ACID 500-334 MG/5ML PO SOLN: 30.0000 mL | ORAL | Status: DC | PRN

## 2021-05-05 MED ORDER — ACETAMINOPHEN 325 MG PO TABS: 650.0000 mg | ORAL_TABLET | ORAL | Status: DC | PRN

## 2021-05-05 MED ORDER — TERBUTALINE SULFATE 1 MG/ML IJ SOLN: 0.2500 mg | Freq: Once | INTRAMUSCULAR | Status: AC

## 2021-05-05 MED ORDER — OXYCODONE-ACETAMINOPHEN 5-325 MG PO TABS: 2.0000 | ORAL_TABLET | ORAL | Status: DC | PRN

## 2021-05-05 MED ORDER — MISOPROSTOL 25 MCG QUARTER TABLET
25.0000 ug | ORAL_TABLET | Freq: Once | ORAL | Status: AC
  Administered 2021-05-05: 25 ug via VAGINAL

## 2021-05-05 MED ORDER — EPHEDRINE 5 MG/ML INJ: 10.0000 mg | INTRAVENOUS | Status: DC | PRN

## 2021-05-05 MED ORDER — ONDANSETRON HCL 4 MG/2ML IJ SOLN: 4.0000 mg | Freq: Four times a day (QID) | INTRAMUSCULAR | Status: DC | PRN

## 2021-05-05 MED ORDER — LACTATED RINGERS IV SOLN
500.0000 mL | Freq: Once | INTRAVENOUS | Status: AC
  Administered 2021-05-05: 250 mL via INTRAVENOUS

## 2021-05-05 MED ORDER — FENTANYL-BUPIVACAINE-NACL 0.5-0.125-0.9 MG/250ML-% EP SOLN
12.0000 mL/h | EPIDURAL | Status: DC | PRN
  Administered 2021-05-05 – 2021-05-06 (×2): 12 mL/h via EPIDURAL
  Filled 2021-05-05: qty 250

## 2021-05-05 MED ORDER — OXYCODONE-ACETAMINOPHEN 5-325 MG PO TABS: 1.0000 | ORAL_TABLET | ORAL | Status: DC | PRN

## 2021-05-05 MED ORDER — TERBUTALINE SULFATE 1 MG/ML IJ SOLN
INTRAMUSCULAR | Status: AC
  Administered 2021-05-06: 0.25 mg via SUBCUTANEOUS
  Filled 2021-05-05: qty 1

## 2021-05-05 MED ORDER — MISOPROSTOL 50MCG HALF TABLET
ORAL_TABLET | ORAL | Status: AC
  Administered 2021-05-05: 50 ug via ORAL
  Filled 2021-05-05: qty 1

## 2021-05-05 MED ORDER — LACTATED RINGERS IV SOLN: INTRAVENOUS | Status: DC

## 2021-05-05 MED ORDER — LACTATED RINGERS IV SOLN
500.0000 mL | INTRAVENOUS | Status: DC | PRN
  Administered 2021-05-05 – 2021-05-06 (×2): 250 mL via INTRAVENOUS
  Administered 2021-05-06 (×2): 500 mL via INTRAVENOUS

## 2021-05-05 MED ORDER — OXYTOCIN BOLUS FROM INFUSION
333.0000 mL | Freq: Once | INTRAVENOUS | Status: AC
  Administered 2021-05-06: 333 mL via INTRAVENOUS

## 2021-05-05 MED ORDER — OXYTOCIN-SODIUM CHLORIDE 30-0.9 UT/500ML-% IV SOLN
2.5000 [IU]/h | INTRAVENOUS | Status: DC
  Administered 2021-05-06: 2.5 [IU]/h via INTRAVENOUS
  Filled 2021-05-05: qty 500

## 2021-05-05 MED ORDER — FENTANYL-BUPIVACAINE-NACL 0.5-0.125-0.9 MG/250ML-% EP SOLN
EPIDURAL | Status: AC
  Filled 2021-05-05: qty 250

## 2021-05-05 MED ORDER — FENTANYL-BUPIVACAINE-NACL 0.5-0.125-0.9 MG/250ML-% EP SOLN: 12.0000 mL/h | EPIDURAL | Status: DC | PRN

## 2021-05-05 MED ORDER — MISOPROSTOL 50MCG HALF TABLET: 50.0000 ug | ORAL_TABLET | Freq: Once | ORAL | Status: AC

## 2021-05-05 MED ORDER — MISOPROSTOL 25 MCG QUARTER TABLET
ORAL_TABLET | ORAL | Status: AC
  Filled 2021-05-05: qty 1

## 2021-05-05 MED ORDER — PHENYLEPHRINE 40 MCG/ML (10ML) SYRINGE FOR IV PUSH (FOR BLOOD PRESSURE SUPPORT)
80.0000 ug | PREFILLED_SYRINGE | INTRAVENOUS | Status: DC | PRN
  Administered 2021-05-06 (×2): 80 ug via INTRAVENOUS
  Filled 2021-05-05: qty 10

## 2021-05-05 MED ORDER — LIDOCAINE HCL (PF) 1 % IJ SOLN: 30.0000 mL | INTRAMUSCULAR | Status: DC | PRN

## 2021-05-05 MED ORDER — LIDOCAINE HCL (PF) 1 % IJ SOLN
INTRAMUSCULAR | Status: DC | PRN
  Administered 2021-05-05: 5 mL via EPIDURAL

## 2021-05-05 NOTE — Anesthesia Procedure Notes (Signed)
Central Venous Catheter Insertion Performed by: Val Eagle, MD, anesthesiologist Start/End1/07/2021 10:31 AM, 05/05/2021 10:46 AM Patient location: Nursing unit. Preanesthetic checklist: patient identified, IV checked, site marked, risks and benefits discussed, surgical consent, monitors and equipment checked, pre-op evaluation, timeout performed and anesthesia consent Position: supine Lidocaine 1% used for infiltration Hand hygiene performed  and maximum sterile barriers used  Catheter size: 8 Fr Total catheter length 16. Central line was placed.Double lumen Procedure performed using ultrasound guided technique. Ultrasound Notes:anatomy identified, needle tip was noted to be adjacent to the nerve/plexus identified, no ultrasound evidence of intravascular and/or intraneural injection and image(s) printed for medical record Attempts: 1 Following insertion, dressing applied, line sutured and Biopatch. Post procedure assessment: blood return through all ports, free fluid flow and no air  Patient tolerated the procedure well with no immediate complications.

## 2021-05-05 NOTE — Progress Notes (Signed)
Sara Lopez is a 26 y.o. G1P0 at [redacted]w[redacted]d admitted for induction of labor due to maternal indications (known hypertrophic cardiomyopathy with pacemaker).  Subjective: Reports feeling more cramping and pain  Objective: BP 100/64    Pulse 81    Temp 98 F (36.7 C) (Oral)    Resp 18    Ht 5\' 5"  (1.651 m)    Wt 93.4 kg    LMP 07/30/2020 (Exact Date)    SpO2 98%    BMI 34.28 kg/m  I/O last 3 completed shifts: In: 1930.5 [P.O.:740; I.V.:1190.5] Out: 725 [Urine:725] Total I/O In: 335 [P.O.:185; I.V.:150] Out: 70 [Urine:70]  FHT:  FHR: 145 bpm, variability: moderate,  accelerations:  Present,  decelerations:  Absent UC:   regular, every 1-3 minutes SVE:   Dilation: 5.5 Effacement (%): 50 Station: -3 Exam by:: Dr. [email or web address]  Labs: Lab Results  Component Value Date   WBC 6.2 05/05/2021   HGB 11.0 (L) 05/05/2021   HCT 31.3 (L) 05/05/2021   MCV 87.7 05/05/2021   PLT 178 05/05/2021    Assessment / Plan: induction of labor due to maternal indications (known hypertrophic cardiomyopathy with pacemaker).  Labor:  cervix ~5cm, head well applied with fundal pressure. AROMed during this check and IUPC placed. Tolerated well. Contracting on own therefore will continue to monitor after AROM. If contractions space out or cervix unchanged at next check in 3-4 hours, can consider Pitocin. Pit max of 20-25 if starts    Fetal Wellbeing:  Category I Pain Control:  Epidural I/D:   GBS neg   #Hypertrophic cardiomyopathy Has telemetry - normal pattern at this time. No cardiac symptoms at this time. Continue to monitor  07/03/2021 05/05/2021, 11:27 PM

## 2021-05-05 NOTE — Progress Notes (Signed)
Valarie Merino L&D RN called the on call MICU RN, Vernell Leep RN @[phone]  05/05/21 to establish contact & discuss plan of care. MICU RN stated she lives approximately 45 minutes away, LD RN to notify if patient status changes.

## 2021-05-05 NOTE — Anesthesia Procedure Notes (Addendum)
Epidural Patient location during procedure: OB Start time: 05/05/2021 4:26 PM End time: 05/05/2021 4:30 PM  Staffing Anesthesiologist: Janeece Riggers, MD  Preanesthetic Checklist Completed: patient identified, IV checked, site marked, risks and benefits discussed, surgical consent, monitors and equipment checked, pre-op evaluation and timeout performed  Epidural Patient position: sitting Prep: DuraPrep and site prepped and draped Patient monitoring: continuous pulse ox and blood pressure Approach: midline Location: L3-L4 Injection technique: LOR air  Needle:  Needle type: Tuohy  Needle gauge: 17 G Needle length: 9 cm and 9 Needle insertion depth: 7 cm Catheter type: closed end flexible Catheter size: 19 Gauge Catheter at skin depth: 13 cm Test dose: negative  Assessment Events: blood not aspirated, injection not painful, no injection resistance, no paresthesia and negative IV test

## 2021-05-05 NOTE — Anesthesia Preprocedure Evaluation (Addendum)
Anesthesia Evaluation  Patient identified by MRN, date of birth, ID band Patient awake    Reviewed: Allergy & Precautions, NPO status , Patient's Chart, lab work & pertinent test results  Airway Mallampati: II  TM Distance: >3 FB Neck ROM: Full    Dental no notable dental hx. (+) Teeth Intact, Dental Advisory Given   Pulmonary neg pulmonary ROS, former smoker,    Pulmonary exam normal breath sounds clear to auscultation       Cardiovascular + Past MI  Normal cardiovascular exam+ Cardiac Defibrillator  Rhythm:Regular Rate:Normal     Neuro/Psych PSYCHIATRIC DISORDERS Depression negative neurological ROS     GI/Hepatic negative GI ROS, Neg liver ROS,   Endo/Other  negative endocrine ROS  Renal/GU negative Renal ROS  negative genitourinary   Musculoskeletal negative musculoskeletal ROS (+)   Abdominal   Peds negative pediatric ROS (+)  Hematology negative hematology ROS (+)   Anesthesia Other Findings   Reproductive/Obstetrics negative OB ROS                             Anesthesia Physical Anesthesia Plan  ASA: 3  Anesthesia Plan: Epidural   Post-op Pain Management:    Induction:   PONV Risk Score and Plan:   Airway Management Planned: Natural Airway  Additional Equipment: None  Intra-op Plan:   Post-operative Plan:   Informed Consent: I have reviewed the patients History and Physical, chart, labs and discussed the procedure including the risks, benefits and alternatives for the proposed anesthesia with the patient or authorized representative who has indicated his/her understanding and acceptance.     Dental Advisory Given  Plan Discussed with: Anesthesiologist and CRNA  Anesthesia Plan Comments: (Labs checked- platelets confirmed with RN in room. Fetal heart tracing, per RN, reported to be stable enough for sitting procedure. Discussed epidural, and patient consents to  the procedure:  included risk of possible headache,backache, failed block, allergic reaction, and nerve injury. This patient was asked if she had any questions or concerns before the procedure started.    --Hypertrophic cardiomyopathy --Cardiac pacemaker/ICD in place with history of VF  --History of MI --Myocardial bridge   ADHD  LHC at [facility] 2/14: LAD mid myocardial bridge (patent in diastole and near complete occlusion distally) // Myoview 2/14: Inferior attenuation, no ischemia, normal EF Depression  Development delay  Echo 5/18: severe asymmetric septal hypertrophy, EF 60-65, dynamic obstruction at rest, peak LVOT 328 cm/sec and peak gradient 43 mmHg, no RWMA, septal thickness 24.6 mm, post wall thickness 15.5 mm, +SAM, mild MR, severe LAE, PASP 30 ICD (implantable cardioverter-defibrillator) in place  Medtronic model D334DRM (Protect), serial number [id number], implanted 04/30/2010     )       Anesthesia Quick Evaluation

## 2021-05-05 NOTE — H&P (Signed)
OBSTETRIC ADMISSION HISTORY AND PHYSICAL  Sara Lopez is a 26 y.o. female G1P0 with IUP at [redacted]w[redacted]d by 18 week [place] presenting for IOL. She reports +FMs, No LOF, no VB, no blurry vision, headaches or peripheral edema, and RUQ pain.  She plans on formula feeding. She request POPs for birth control. She received her prenatal care at  [facility]    Dating: By 35 Week [place] --->  Estimated Date of Delivery: 05/12/21  Sono:    @[redacted]w[redacted]d , CWD, normal anatomy, cephalic presentation, 3310g, EFW   Prenatal History/Complications:  --Hypertrophic cardiomyopathy --Cardiac pacemaker/ICD in place with history of VF  --History of MI --Myocardial bridge   Past Medical History: Past Medical History:  Diagnosis Date   ADHD    Coronary-myocardial bridge    LHC at [facility] 2/14: LAD mid myocardial bridge (patent in diastole and near complete occlusion distally) // Myoview 2/14: Inferior attenuation, no ischemia, normal EF   Depression    Development delay    History of echocardiogram 08/2016   Echo 5/18: severe asymmetric septal hypertrophy, EF 60-65, dynamic obstruction at rest, peak LVOT 328 cm/sec and peak gradient 43 mmHg, no RWMA, septal thickness 24.6 mm, post wall thickness 15.5 mm, +SAM, mild MR, severe LAE, PASP 30   History of echocardiogram 1.2014 at [facility]   Echo 1/14: Hypertrophic CM, mild dynamic LVOT - peak 27 mmHg, chordal SAM with LVOT obstruction, mild MR, dilate LCA, abnormal LV diastolic function, hyperdynamic LV systolic function    HOCM (hypertrophic obstructive cardiomyopathy) (HCC)    ICD (implantable cardioverter-defibrillator) in place    Medtronic model D334DRM (Protect), serial number [id number] H, implanted 04/30/2010    Myocardial infarction (HCC)    hx of elevated Tn levels // LHC in 2014 at [facility] with myocardial bridging   VF (ventricular fibrillation) (HCC)    admitted in 2011 with VF arrest >> dx with HOCM // s/p ICD    Past Surgical History: Past Surgical History:  Procedure  Laterality Date   ICD GENERATOR CHANGEOUT N/A 05/10/2018   Procedure: ICD GENERATOR CHANGEOUT;  Surgeon: 07/09/2018, MD;  Location: [facility] INVASIVE CV LAB;  Service: Cardiovascular;  Laterality: N/A;   ICD IMPLANT  2011    Obstetrical History: OB History     Gravida  1   Para      Term      Preterm      AB      Living         SAB      IAB      Ectopic      Multiple      Live Births              Social History Social History   Socioeconomic History   Marital status: Single    Spouse name: Not on file   Number of children: Not on file   Years of education: Not on file   Highest education level: Not on file  Occupational History   Occupation: 2012  Tobacco Use   Smoking status: Former    Types: Cigarettes    Quit date: 07/01/2020    Years since quitting: 0.8   Smokeless tobacco: Former  08/31/2020 Use: Former   Substances: Nicotine   Devices: not since preg 06/2020  Substance and Sexual Activity   Alcohol use: Not Currently    Alcohol/week: 1.0 standard drink    Types: 1 Glasses of wine per week    Comment:  not since preg   Drug use: No    Types: Marijuana    Comment: no drugs since 2011   Sexual activity: Yes    Birth control/protection: None  Other Topics Concern   Not on file  Social History Narrative   Trying to get a job with [employer]    Currently working as Conservation officer, naturecashier at [employer]   Single    No kids   Social Determinants of Corporate investment bankerHealth   Financial Resource Strain: Not on file  Food Insecurity: Food Insecurity Present   Worried About Programme researcher, broadcasting/film/videounning Out of Food in the Last Year: Sometimes true   Baristaan Out of Food in the Last Year: Never true  Transportation Needs: No Transportation Needs   Lack of Transportation (Medical): No   Lack of Transportation (Non-Medical): No  Physical Activity: Not on file  Stress: Not on file  Social Connections: Not on file    Family History: Family History  Adopted: Yes     Allergies: No Known Allergies  Medications Prior to Admission  Medication Sig Dispense Refill Last Dose   Prenatal Vit-Fe Fumarate-FA (MULTIVITAMIN-PRENATAL) 27-0.8 MG TABS tablet Take 1 tablet by mouth daily at 12 noon.   Past Month   Blood Pressure Monitoring DEVI 1 each by Does not apply route once a week. 1 each 0    Review of Systems   All systems reviewed and negative except as stated in HPI  Blood pressure 101/66, pulse 73, temperature 97.6 F (36.4 C), temperature source Oral, resp. rate 18, height 5\' 5"  (1.651 m), weight 93.4 kg, last menstrual period 07/30/2020, SpO2 99 %. General appearance: alert and cooperative Lungs: clear to auscultation bilaterally Heart: regular rate and rhythm Abdomen: soft, non-tender; bowel sounds normal Extremities: Homans sign is negative, no sign of DVT Presentation: cephalic Fetal monitoring: 135 bpm, moderate variability, +15x15 accels, no decels Uterine activity: none Dilation: Fingertip Effacement (%): 60 Station: -3, Ballotable Exam by:: Duwayne Heckanielle, CNM  Prenatal labs: ABO, Rh: --/--/B POS (01/03 0620) Antibody: NEG (01/03 0620) Rubella: 0.98 (07/08 1103) RPR: NON REACTIVE (01/03 0642)  HBsAg: Negative (07/08 1103)  HIV: Non Reactive (10/28 0830)  GBS: Negative/-- (12/22 1654)  2 hr Glucola passed Genetic screening  LR NIPS Anatomy US normal female   Prenatal Transfer Tool  Maternal Diabetes: No Genetic Screening: Normal Maternal Ultrasounds/Referrals: Fetal Heart Anomalies Fetal Ultrasounds or other Referrals:  Fetal echo Maternal Substance Abuse:  No Significant Maternal Medications:  None Significant Maternal Lab Results: Group B Strep negative  Results for orders placed or performed during the hospital encounter of 05/05/21 (from the past 24 hour(s))  Type and screen [facility]   Collection Time: 05/05/21  6:20 AM  Result Value Ref Range   ABO/RH(D) B POS    Antibody Screen NEG    Sample  Expiration      05/08/2021,2359 Performed at [facility], [address] [place], [place] [address]   CBC   Collection Time: 05/05/21  6:42 AM  Result Value Ref Range   WBC 6.2 4.0 - 10.5 K/uL   RBC 3.57 (L) 3.87 - 5.11 MIL/uL   Hemoglobin 11.0 (L) 12.0 - 15.0 g/dL   HCT 62.131.3 (L) 30.836.0 - 65.746.0 %   MCV 87.7 80.0 - 100.0 fL   MCH 30.8 26.0 - 34.0 pg   MCHC 35.1 30.0 - 36.0 g/dL   RDW 84.613.1 96.211.5 - 95.215.5 %   Platelets 178 150 - 400 K/uL  nRBC 0.0 0.0 - 0.2 %  RPR   Collection Time: 05/05/21  6:42 AM  Result Value Ref Range   RPR Ser Ql NON REACTIVE NON REACTIVE  Comprehensive metabolic panel   Collection Time: 05/05/21  6:42 AM  Result Value Ref Range   Sodium 134 (L) 135 - 145 mmol/L   Potassium 3.7 3.5 - 5.1 mmol/L   Chloride 106 98 - 111 mmol/L   CO2 18 (L) 22 - 32 mmol/L   Glucose, Bld 110 (H) 70 - 99 mg/dL   BUN 6 6 - 20 mg/dL   Creatinine, Ser 0.93 0.44 - 1.00 mg/dL   Calcium 8.2 (L) 8.9 - 10.3 mg/dL   Total Protein 5.8 (L) 6.5 - 8.1 g/dL   Albumin 2.5 (L) 3.5 - 5.0 g/dL   AST 25 15 - 41 U/L   ALT 12 0 - 44 U/L   Alkaline Phosphatase 156 (H) 38 - 126 U/L   Total Bilirubin 0.5 0.3 - 1.2 mg/dL   GFR, Estimated >23 >55 mL/min   Anion gap 10 5 - 15  Resp Panel by RT-PCR (Flu A&B, Covid) Nasopharyngeal Swab   Collection Time: 05/05/21  6:58 AM   Specimen: Nasopharyngeal Swab; Nasopharyngeal(NP) swabs in vial transport medium  Result Value Ref Range   SARS Coronavirus 2 by RT PCR NEGATIVE NEGATIVE   Influenza A by PCR NEGATIVE NEGATIVE   Influenza B by PCR NEGATIVE NEGATIVE    Patient Active Problem List   Diagnosis Date Noted   Pregnancy 05/05/2021   Red Chart Rounds Patient 12/19/2020   Supervision of high risk pregnancy, antepartum 10/28/2020   Cardiac pacemaker in situ 03/27/2018   Heart disease 03/27/2018   HOCM (hypertrophic obstructive cardiomyopathy) (HCC) 09/21/2016   History of cardiac arrest 09/21/2016   Coronary-myocardial bridge    History of  MI (myocardial infarction) 09/16/2016   ADHD (attention deficit hyperactivity disorder) 07/21/2011   Depression 07/21/2011   Developmental delay 07/21/2011   VF (ventricular fibrillation) (HCC) 07/21/2011   Single implantable cardioverter-defibrillator (ICD) in situ 07/21/2011    Assessment/Plan:  Sara Lopez is a 26 y.o. G1P0 at [redacted]w[redacted]d here for IOL, in the setting of high risk pregnancy including hypertrophic cardiomyopathy, history of MI/VF with pacemaker/ICD in place, and a coronary myocardial bridge.   #Labor: Cytotec. Will place FB at next exa #Pain: Planning for early epidural (spoke with Anesthesia on arrival)  #FWB: Cat 1  #ID: Gbs Negative  #MOF: Formula  #MOC: POPs #Circ: NA (female)   #HCM   history of MI/VF with pacemaker/ICD in place   Coronary myocardial bridge: No current symptoms. Plan as follows:  --Cardiac monitoring, if change or concern for instability > will have A line placed.  --If patient approved, plan for early epidural with slow increases of medication to hopefully avoid associated hypotension  --Maximal pitocin doses of 20-25  --Avoid a prolonged second stage of labor  --Avoid diuretics  --Anesthesia and cardiology (Dr. Servando Salina) are aware of her induction   #Fetal abnormal echo: Mild hypertrophied R ventricle. Ped cardiology recommended post-natal ECG with echocardiogram prior to discharge for infant.   Plan to monitor very closely.   Brand Males, CNM  05/05/2021, [id number]

## 2021-05-05 NOTE — Progress Notes (Signed)
Sara Lopez is a 26 y.o. G1P0 at [redacted]w[redacted]d for IOL  Subjective: She is comfortable with out complaints.   Objective: BP 101/66    Pulse 70    Temp 97.7 F (36.5 C) (Oral)    Resp 18    Ht 5\' 5"  (1.651 m)    Wt 93.4 kg    LMP 07/30/2020 (Exact Date)    SpO2 98%    BMI 34.28 kg/m  No intake/output data recorded. Total I/O In: 703.1 [P.O.:340; I.V.:363.1] Out: 550 [Urine:550]  FHT:  FHR: 130 bpm, variability: moderate,  accelerations:  Present,  decelerations:  Absent UC:   regular, every 3 minutes SVE:   Dilation: 2 Effacement (%): 60 Station: -3 Exam by:: Dr [email or web address] bulb placed  Labs: Lab Results  Component Value Date   WBC 6.2 05/05/2021   HGB 11.0 (L) 05/05/2021   HCT 31.3 (L) 05/05/2021   MCV 87.7 05/05/2021   PLT 178 05/05/2021    Assessment / Plan: Induction of labor due to [facility] medical conditions,  progressing well on pitocin  Labor: Progressing normally - foley bulb placed, she is responding well to medications. Will do buccal miso and then once FB is out, plan will be for pitocin/arom. Reviewed plan is for low dose pitocin (max of 20-25). Preeclampsia:  no signs or symptoms of toxicity Fetal Wellbeing:  Category I Pain Control:  Epidural planned I/D:   GBS neg Anticipated MOD:   Shortened second stage anticipated   All questions for pt and family answered.   [facility] 05/05/2021, 2:53 PM

## 2021-05-06 DIAGNOSIS — Z95810 Presence of automatic (implantable) cardiac defibrillator: Secondary | ICD-10-CM | POA: Diagnosis not present

## 2021-05-06 DIAGNOSIS — Z3A39 39 weeks gestation of pregnancy: Secondary | ICD-10-CM

## 2021-05-06 DIAGNOSIS — O9942 Diseases of the circulatory system complicating childbirth: Secondary | ICD-10-CM

## 2021-05-06 DIAGNOSIS — Z3490 Encounter for supervision of normal pregnancy, unspecified, unspecified trimester: Secondary | ICD-10-CM

## 2021-05-06 DIAGNOSIS — D649 Anemia, unspecified: Secondary | ICD-10-CM | POA: Diagnosis not present

## 2021-05-06 DIAGNOSIS — I422 Other hypertrophic cardiomyopathy: Secondary | ICD-10-CM | POA: Diagnosis not present

## 2021-05-06 MED ORDER — TRANEXAMIC ACID-NACL 1000-0.7 MG/100ML-% IV SOLN
INTRAVENOUS | Status: AC
  Filled 2021-05-06: qty 100

## 2021-05-06 MED ORDER — SIMETHICONE 80 MG PO CHEW
80.0000 mg | CHEWABLE_TABLET | ORAL | Status: DC | PRN
  Filled 2021-05-06: qty 1

## 2021-05-06 MED ORDER — LACTATED RINGERS IV BOLUS
250.0000 mL | Freq: Once | INTRAVENOUS | Status: AC
  Administered 2021-05-06: 250 mL via INTRAVENOUS

## 2021-05-06 MED ORDER — PHENYLEPHRINE HCL-NACL 20-0.9 MG/250ML-% IV SOLN
0.0000 ug/min | INTRAVENOUS | Status: DC
  Administered 2021-05-06: 20 ug/min via INTRAVENOUS
  Filled 2021-05-06: qty 250

## 2021-05-06 MED ORDER — WITCH HAZEL-GLYCERIN EX PADS: 1.0000 "application " | MEDICATED_PAD | CUTANEOUS | Status: DC | PRN

## 2021-05-06 MED ORDER — ONDANSETRON HCL 4 MG/2ML IJ SOLN: 4.0000 mg | INTRAMUSCULAR | Status: DC | PRN

## 2021-05-06 MED ORDER — ONDANSETRON HCL 4 MG PO TABS: 4.0000 mg | ORAL_TABLET | ORAL | Status: DC | PRN

## 2021-05-06 MED ORDER — DIPHENHYDRAMINE HCL 25 MG PO CAPS: 25.0000 mg | ORAL_CAPSULE | Freq: Four times a day (QID) | ORAL | Status: DC | PRN

## 2021-05-06 MED ORDER — COCONUT OIL OIL
1.0000 "application " | TOPICAL_OIL | Status: DC | PRN
  Administered 2021-05-07: 1 via TOPICAL

## 2021-05-06 MED ORDER — SODIUM CHLORIDE 0.9% FLUSH
10.0000 mL | Freq: Two times a day (BID) | INTRAVENOUS | Status: DC
  Administered 2021-05-06: 20 mL

## 2021-05-06 MED ORDER — MEASLES, MUMPS & RUBELLA VAC IJ SOLR: 0.5000 mL | Freq: Once | INTRAMUSCULAR | Status: DC

## 2021-05-06 MED ORDER — TRANEXAMIC ACID-NACL 1000-0.7 MG/100ML-% IV SOLN
1000.0000 mg | INTRAVENOUS | Status: AC
  Administered 2021-05-06: 1000 mg via INTRAVENOUS

## 2021-05-06 MED ORDER — IBUPROFEN 600 MG PO TABS
600.0000 mg | ORAL_TABLET | Freq: Four times a day (QID) | ORAL | Status: DC
  Administered 2021-05-06 – 2021-05-07 (×2): 600 mg via ORAL
  Filled 2021-05-06 (×2): qty 1

## 2021-05-06 MED ORDER — ACETAMINOPHEN 325 MG PO TABS: 650.0000 mg | ORAL_TABLET | ORAL | Status: DC | PRN

## 2021-05-06 MED ORDER — PRENATAL MULTIVITAMIN CH
1.0000 | ORAL_TABLET | Freq: Every day | ORAL | Status: DC
  Administered 2021-05-07 – 2021-05-08 (×2): 1 via ORAL
  Filled 2021-05-06 (×2): qty 1

## 2021-05-06 MED ORDER — DIBUCAINE (PERIANAL) 1 % EX OINT: 1.0000 "application " | TOPICAL_OINTMENT | CUTANEOUS | Status: DC | PRN

## 2021-05-06 MED ORDER — CHLORHEXIDINE GLUCONATE CLOTH 2 % EX PADS
6.0000 | MEDICATED_PAD | Freq: Every day | CUTANEOUS | Status: DC
  Administered 2021-05-06: 6 via TOPICAL

## 2021-05-06 MED ORDER — LACTATED RINGERS IV BOLUS: 500.0000 mL | Freq: Once | INTRAVENOUS | Status: AC

## 2021-05-06 MED ORDER — OXYTOCIN-SODIUM CHLORIDE 30-0.9 UT/500ML-% IV SOLN: 1.0000 m[IU]/min | INTRAVENOUS | Status: DC

## 2021-05-06 MED ORDER — BENZOCAINE-MENTHOL 20-0.5 % EX AERO
1.0000 "application " | INHALATION_SPRAY | CUTANEOUS | Status: DC | PRN
  Administered 2021-05-07: 1 via TOPICAL
  Filled 2021-05-06: qty 56

## 2021-05-06 MED ORDER — PHENYLEPHRINE CONCENTRATED 100MG/250ML (0.4 MG/ML) INFUSION SIMPLE
0.0000 ug/min | INTRAVENOUS | Status: DC
  Administered 2021-05-06: 90 ug/min via INTRAVENOUS
  Filled 2021-05-06: qty 250

## 2021-05-06 MED ORDER — TERBUTALINE SULFATE 1 MG/ML IJ SOLN: 0.2500 mg | Freq: Once | INTRAMUSCULAR | Status: DC | PRN

## 2021-05-06 MED ORDER — SENNOSIDES-DOCUSATE SODIUM 8.6-50 MG PO TABS
2.0000 | ORAL_TABLET | Freq: Every day | ORAL | Status: DC
  Administered 2021-05-07 – 2021-05-08 (×2): 2 via ORAL
  Filled 2021-05-06 (×2): qty 2

## 2021-05-06 MED ORDER — SODIUM CHLORIDE 0.9% FLUSH: 10.0000 mL | INTRAVENOUS | Status: DC | PRN

## 2021-05-06 NOTE — Progress Notes (Signed)
Labor Progress Note Sara Lopez is a 26 y.o. G1P0 at [redacted]w[redacted]d who presented for IOL due to history of hypertrophic cardiomyopathy.   S: Doing well. No concerns. Not feeling pressure or pain at this time.  O:  BP 108/67    Pulse 74    Temp 98.9 F (37.2 C) (Oral)    Resp 18    Ht 5\' 5"  (1.651 m)    Wt 93.4 kg    LMP 07/30/2020 (Exact Date)    SpO2 99%    BMI 34.28 kg/m   EFM: Baseline 150 bpm, moderate variability, + accels, intermittent late decels  Toco: Contractions every 2-3 min; adequate per IUPC  CVE: Dilation: 10 Dilation Complete Date: 05/06/21 Dilation Complete Time: 1747 Effacement (%): 100 Station: Plus 1, Plus 2 Presentation: Vertex Exam by:: Brice Kossman MD  A&P: 26 y.o. G1P0 [redacted]w[redacted]d   #Labor: Progressing well. Now complete and +1 station with caput. Not feeling any pressure or urge to push. Will labor down for 45 min and reassess.  #Pain: Epidural  #FWB: Cat 2 due to late decelerations. Continues to have reassuring variability and accels. Will given IVF bolus and reassess.  #GBS negative  #HOCM: Cardiology consulted. Overall stable. Will wean phenylephrine and give IV fluid bolus. CVP within normal range. Will continue to monitor closely.   [redacted]w[redacted]d, MD 6:50 PM

## 2021-05-06 NOTE — Lactation Note (Addendum)
This note was copied from a baby's chart. Lactation Consultation Note  Patient Name: Sara Lopez [id number] Date: 05/06/2021 Reason for consult: L&D Initial assessment;1st time breastfeeding;Term Age:27 hours LC entered the room, mom was doing skin to skin with infant. LC assisted mom with reverse pressure softening to help evert nipple out more prior to latching infant at the breast. See latch score. Mom latched infant on her left breast using the football hold position, infant sustained latch and breastfeed for 12 minutes. Mom knows to breast feed infant according to primal cues, 8 to 12+ or more times within 24 hours, skin to skin. Mom knows to call RN/LC on MBU if she has any breastfeeding questions,  concerns or needs further assistance with latching infant at the breast. Mom could benefit from hand pump prior to latching infant at the breast. LC congratulated family on the birth of their daughter. Maternal Data    Feeding Mother's Current Feeding Choice: Breast Milk  LATCH Score Latch: Grasps breast easily, tongue down, lips flanged, rhythmical sucking.  Audible Swallowing: Spontaneous and intermittent  Type of Nipple: Inverted (Mom's nipples evert out with stimulation prior to latching infant at breast.)  Comfort (Breast/Nipple): Soft / non-tender  Hold (Positioning): Assistance needed to correctly position infant at breast and maintain latch.  LATCH Score: 7   Lactation Tools Discussed/Used    Interventions Interventions: Reverse pressure;Adjust position;Breast compression;Support pillows;Position options;Education  Discharge    Consult Status Consult Status: Follow-up from L&D    Sara Lopez 05/06/2021, 8:53 PM

## 2021-05-06 NOTE — Discharge Summary (Signed)
Postpartum Discharge Summary  Date of Service updated     Patient Name: Sara Lopez DOB: 11-Dec-1995 MRN: [id number]  Date of admission: 05/05/2021 Delivery date:05/06/2021  Delivering provider: Griffin Basil  Date of discharge: 05/08/2021  Admitting diagnosis: Pregnancy [Z34.90] Intrauterine pregnancy: [redacted]w[redacted]d    Secondary diagnosis:  Principal Problem:   Vacuum-assisted vaginal delivery Active Problems:   History of MI (myocardial infarction)   HOCM (hypertrophic obstructive cardiomyopathy) ([place]   History of cardiac arrest   Coronary-myocardial bridge   Cardiac pacemaker in situ   Single implantable cardioverter-defibrillator (ICD) in situ   Supervision of high risk pregnancy, antepartum   Pregnancy  Additional problems: Postpartum anemia for blood loss   Discharge diagnosis: Term Pregnancy Delivered                                              Post partum procedures:blood transfusion Augmentation: AROM, Cytotec, and IP Foley Complications: None  Hospital course: Induction of Labor With Vaginal Delivery   26y.o. yo G1P0 at 371w1das admitted to the hospital 05/05/2021 for induction of labor.  Indication for induction:  Hypertrophic cardiomyopathy .  Patient received Cytotec and foley balloon placement followed by AROM until she progressed to complete. Patient had an uncomplicated vacuum-assisted vaginal delivery.  Membrane Rupture Time/Date: 10:05 PM ,05/05/2021   Delivery Method:Vaginal, Vacuum (Extractor)  Episiotomy: None  Lacerations:  Vaginal  Details of delivery can be found in separate delivery note.  P She remained on telemetry for 24 hours. Her vitals were normal from a cardiac standpoint. She had her central line removed on PPD1. On PPD1 she had a low temp that had no clear reason- no concern for sepsis as she was complete well appearing. This resolved spontaneously. On PPD1 she had a hgb of 7.8 and was given 1 unit of PRBC due to h/o heart disease. Her  creatinine was initially elevated following delivery on PPD1 of 1.25 but resolved with fluids. Patient otherwise had a routine postpartum course - she tolerated PO, she voided and ambulatory with issues.  Patient is discharged home 05/08/21.  Newborn Data: Birth date:05/06/2021  Birth time:7:53 PM  Gender:Female  Living status:Living  Apgars:8 ,9  Weight:3710 g   Magnesium Sulfate received: No BMZ received: No Rhophylac: N/A MMR: Offered postpartum  T-DaP: Given prenatally Flu: No Transfusion: Yes - 1 unit PRBC  Physical exam  Vitals:   05/08/21 0329 05/08/21 0802 05/08/21 1044 05/08/21 1106  BP: 94/60 (!) 96/48 (!) 99/58 (!) 96/56  Pulse: 94 80 82 81  Resp: '18 18 18 18  ' Temp: 100.1 F (37.8 C) 99.4 F (37.4 C) 98 F (36.7 C) 98 F (36.7 C)  TempSrc: Oral Oral Oral Oral  SpO2: 95% 99% 99% 99%  Weight:      Height:       General: alert, cooperative, and no distress Lochia: appropriate Uterine Fundus: firm Incision: N/A DVT Evaluation: No evidence of DVT seen on physical exam. No cords or calf tenderness.  Labs: Lab Results  Component Value Date   WBC 9.5 05/08/2021   HGB 7.8 (L) 05/08/2021   HCT 22.4 (L) 05/08/2021   MCV 86.8 05/08/2021   PLT 138 (L) 05/08/2021   CMP Latest Ref Rng & Units 05/08/2021  Glucose 70 - 99 mg/dL 75  BUN 6 - 20 mg/dL 14  Creatinine  0.44 - 1.00 mg/dL 0.95  Sodium 135 - 145 mmol/L 132(L)  Potassium 3.5 - 5.1 mmol/L 3.6  Chloride 98 - 111 mmol/L 108  CO2 22 - 32 mmol/L 18(L)  Calcium 8.9 - 10.3 mg/dL 7.7(L)  Total Protein 6.5 - 8.1 g/dL -  Total Bilirubin 0.3 - 1.2 mg/dL -  Alkaline Phos 38 - 126 U/L -  AST 15 - 41 U/L -  ALT 0 - 44 U/L -   Edinburgh Score: Edinburgh Postnatal Depression Scale Screening Tool 05/07/2021  I have been able to laugh and see the funny side of things. 0  I have looked forward with enjoyment to things. 0  I have blamed myself unnecessarily when things went wrong. 0  I have been anxious or worried for no  good reason. 0  I have felt scared or panicky for no good reason. 0  Things have been getting on top of me. 0  I have been so unhappy that I have had difficulty sleeping. 0  I have felt sad or miserable. 0  I have been so unhappy that I have been crying. 0  The thought of harming myself has occurred to me. 0  Edinburgh Postnatal Depression Scale Total 0     After visit meds:  Allergies as of 05/08/2021   No Known Allergies      Medication List     STOP taking these medications    Blood Pressure Monitoring Devi       TAKE these medications    acetaminophen 325 MG tablet Commonly known as: Tylenol Take 2 tablets (650 mg total) by mouth every 4 (four) hours as needed (for pain scale < 4).   ibuprofen 600 MG tablet Commonly known as: ADVIL Take 1 tablet (600 mg total) by mouth every 6 (six) hours.   multivitamin-prenatal 27-0.8 MG Tabs tablet Take 1 tablet by mouth daily at 12 noon.         Discharge home in stable condition Infant Feeding: Breast Infant Disposition:rooming in Discharge instruction: per After Visit Summary and Postpartum booklet. Activity: Advance as tolerated. Pelvic rest for 6 weeks.  Diet: routine diet Future Appointments: Future Appointments  Date Time Provider [place]  05/12/2021  2:00 PM [facility] NURSE [facility] [place]  05/18/2021 10:00 AM [facility] DEVICE REMOTES [facility]  05/25/2021  9:00 AM Tobb, Kardie, DO [facility]  06/03/2021  3:15 PM Radene Gunning, MD [facility]  08/17/2021 10:00 AM [facility] DEVICE REMOTES [facility]  11/16/2021 10:00 AM [facility] DEVICE REMOTES [facility]  02/15/2022 10:00 AM [facility] DEVICE REMOTES [facility]   Follow up Visit:  [place] [facility] [place] [facility] Follow up.   Specialty: Obstetrics and Gynecology Why: Appointment is for 2/1 Contact information: [place] [id number] [phone]        Berniece Salines, DO Follow up.   Specialty: Cardiology Why: Appointment is for 1/23 Contact information: [place] [address] [phone]                Message sent to [facility] by Dr. Gwenlyn Perking on 05/06/20.   Please schedule this patient for a In person postpartum visit in 6 weeks with the following provider: MD. Additional Postpartum F/U: None   High risk pregnancy complicated by:  HOCM Delivery mode:  Vaginal, Vacuum (Extractor)  Anticipated Birth Control:  POPs   05/08/2021 Radene Gunning, MD

## 2021-05-06 NOTE — Progress Notes (Signed)
Dr. Desmond Lope at bedside CVP-11 at 2055

## 2021-05-06 NOTE — Consult Note (Signed)
Cardiology Consultation:   Patient ID: Sara Lopez MRN: [id number]; DOB: 04-19-1996  Admit date: 05/05/2021 Date of Consult: 05/06/2021  PCP:  Loletta Specter, PA-C   [facility] Providers Cardiologist:  Thomasene Ripple, DO  Electrophysiologist:  Sherryl Manges, MD       Patient Profile:   Sara Lopez is a 26 y.o. 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 Debroah Loop.  History of Present Illness:   [name] is currently admitted to the obstetrics ward.  She is currently in labor with her first child.  Cardiology was asked to assess her given her history of hypertrophic cardiomyopathy with defibrillator.  Her hypertrophic cardiomyopathy history as detailed below.  She was admitted 2011 with Vfib arrest and subsequently diagnosed with HOCM. She has a single chamber ICD in place for secondary prevention. She is a former smoker. She was not maintained on a BB. Dr. Servando Salina has evaluated her and planned to start low dose toprol if she became symptomatic with SOB or palpitations. Dr. Servando Salina recommended vaginal delivery unless cesarean section was recommended by OB. She had a relatively uneventful pregnancy. She was admitted 05/05/20 for planned induction given her HOCM.   Echo was updated on 04/03/21 and did not show LVOT obstruction, normal EF and grade 2 DD.  We are consulted for management of HOCM during delivery. Recommend avoiding dehydration.   She is currently dilated 8 cm.  She is without any major symptoms.  Cardiovascular examination is normal with no appreciable murmur.  Echocardiogram reviewed.  She has no evidence of outflow tract obstruction.  The largest gradient was 22 mmHg which is inconsistent with obstruction.  She really has no symptoms from her hypertrophic cardiomyopathy.  She is on no medications at home.  Overall vital signs are  stable.  Denies any major symptoms.    Past Medical History:  Diagnosis Date   ADHD    Coronary-myocardial bridge    LHC at [facility] 2/14: LAD mid myocardial bridge (patent in diastole and near complete occlusion distally) // Myoview 2/14: Inferior attenuation, no ischemia, normal EF   Depression    Development delay    History of echocardiogram 08/2016   Echo 5/18: severe asymmetric septal hypertrophy, EF 60-65, dynamic obstruction at rest, peak LVOT 328 cm/sec and peak gradient 43 mmHg, no RWMA, septal thickness 24.6 mm, post wall thickness 15.5 mm, +SAM, mild MR, severe LAE, PASP 30   History of echocardiogram 1.2014 at [facility]   Echo 1/14: Hypertrophic CM, mild dynamic LVOT - peak 27 mmHg, chordal SAM with LVOT obstruction, mild MR, dilate LCA, abnormal LV diastolic function, hyperdynamic LV systolic function    HOCM (hypertrophic obstructive cardiomyopathy) (HCC)    ICD (implantable cardioverter-defibrillator) in place    Medtronic model D334DRM (Protect), serial number [id number], implanted 04/30/2010    Myocardial infarction (HCC)    hx of elevated Tn levels // LHC in 2014 at [facility] with myocardial bridging   VF (ventricular fibrillation) (HCC)    admitted in 2011 with VF arrest >> dx with HOCM // s/p ICD    Past Surgical History:  Procedure Laterality Date   ICD GENERATOR CHANGEOUT N/A 05/10/2018   Procedure: ICD GENERATOR CHANGEOUT;  Surgeon: Duke Salvia, MD;  Location: [facility] INVASIVE CV LAB;  Service: Cardiovascular;  Laterality: N/A;   ICD IMPLANT  2011  Home Medications:  Prior to Admission medications   Medication Sig Start Date End Date Taking? Authorizing Provider  Prenatal Vit-Fe Fumarate-FA (MULTIVITAMIN-PRENATAL) 27-0.8 MG TABS tablet Take 1 tablet by mouth daily at 12 noon.   Yes [provider]  Blood Pressure Monitoring DEVI 1 each by Does not apply route once a week. 10/28/20   Marny LowensteinWenzel, Julie N, PA-C    Inpatient Medications: Scheduled Meds:   Chlorhexidine Gluconate Cloth  6 each Topical Daily   oxytocin 40 units in LR 1000 mL  333 mL Intravenous Once   sodium chloride flush  10-40 mL Intracatheter Q12H   Continuous Infusions:  fentaNYL 2 mcg/mL w/bupivacaine 0.125% in NS 250 mL 12 mL/hr (05/06/21 0858)   lactated ringers 500 mL (05/06/21 0351)   lactated ringers 50 mL/hr at 05/06/21 0840   oxytocin     oxytocin     phenylephrine (NEO-SYNEPHRINE) Adult infusion     PRN Meds: acetaminophen, diphenhydrAMINE, fentaNYL 2 mcg/mL w/bupivacaine 0.125% in NS 250 mL, lactated ringers, lidocaine (PF), ondansetron, oxyCODONE-acetaminophen, oxyCODONE-acetaminophen, phenylephrine, sodium chloride flush, sodium citrate-citric acid, terbutaline  Allergies:   No Known Allergies  Social History:   Social History   Socioeconomic History   Marital status: Single    Spouse name: Not on file   Number of children: Not on file   Years of education: Not on file   Highest education level: Not on file  Occupational History   Occupation: Conservation officer, naturecashier  Tobacco Use   Smoking status: Former    Types: Cigarettes    Quit date: 07/01/2020    Years since quitting: 0.8   Smokeless tobacco: Former  Building services engineerVaping Use   Vaping Use: Former   Substances: Nicotine   Devices: not since preg 06/2020  Substance and Sexual Activity   Alcohol use: Not Currently    Alcohol/week: 1.0 standard drink    Types: 1 Glasses of wine per week    Comment: not since preg   Drug use: No    Types: Marijuana    Comment: no drugs since 2011   Sexual activity: Yes    Birth control/protection: None  Other Topics Concern   Not on file  Social History Narrative   Trying to get a job with [employer]    Currently working as Conservation officer, naturecashier at [employer]   Single    No kids   Social Determinants of Corporate investment bankerHealth   Financial Resource Strain: Not on file  Food Insecurity: Food Insecurity Present   Worried About Programme researcher, broadcasting/film/videounning Out of Food in the Last Year: Sometimes true    Baristaan Out of Food in the Last Year: Never true  Transportation Needs: No Transportation Needs   Lack of Transportation (Medical): No   Lack of Transportation (Non-Medical): No  Physical Activity: Not on file  Stress: Not on file  Social Connections: Not on file  Intimate Partner Violence: Not on file    Family History:    Family History  Adopted: Yes     ROS:  Please see the history of present illness.   All other ROS reviewed and negative.     Physical Exam/Data:   Vitals:   05/06/21 1548 05/06/21 1549 05/06/21 1550 05/06/21 1551  BP:      Pulse: 83 83 83 79  Resp:      Temp:      TempSrc:      SpO2: 100% 100% 100% 100%  Weight:      Height:  Intake/Output Summary (Last 24 hours) at 05/06/2021 1618 Last data filed at 05/06/2021 1100 Gross per 24 hour  Intake 3114.3 ml  Output 925 ml  Net 2189.3 ml   Last 3 Weights 05/05/2021 04/28/2021 04/23/2021  Weight (lbs) 206 lb 202 lb 8 oz 200 lb 9.6 oz  Weight (kg) 93.441 kg 91.853 kg 90.992 kg     Body mass index is 34.28 kg/m.  General:  Well nourished, well developed, in no acute distress HEENT: normal Neck: no JVD Vascular: No carotid bruits; Distal pulses 2+ bilaterally Cardiac:  normal S1, S2; RRR; no murmur  Lungs:  clear to auscultation bilaterally, no wheezing, rhonchi or rales  Abd: soft, nontender, no hepatomegaly  Ext: no edema Musculoskeletal:  No deformities, BUE and BLE strength normal and equal Skin: warm and dry  Neuro:  CNs 2-12 intact, no focal abnormalities noted Psych:  Normal affect   EKG:  The EKG was personally reviewed and demonstrates:  N/A  Relevant CV Studies:  Echo 04/03/21:  1. Left ventricular ejection fraction, by estimation, is 65 to 70%. The  left ventricle has normal function. The left ventricle has no regional  wall motion abnormalities. There is severe asymmetric left ventricular  hypertrophy of the septal segment  (18-19 mm at mid ventricular septum). LVOT flow  acceleration with maximum  instantaneous gradient of 22 mmHg, with systolic anterior motion of mitral  valve. Morphology may be best described as reverse curve HCM. Left  ventricular diastolic parameters are  consistent with Grade II diastolic dysfunction (pseudonormalization).   2. Right ventricular systolic function is mildly reduced. The right  ventricular size is normal. Tricuspid regurgitation signal is inadequate  for assessing PA pressure.   3. Left atrial size was severely dilated.   4. A small pericardial effusion is present. The pericardial effusion is  circumferential. There is no evidence of cardiac tamponade.   5. Systolic anterior motion of the mitral valve.. The mitral valve is  grossly normal. Mild-moderate and posteriorly directed mitral valve  regurgitation. No evidence of mitral stenosis.   6. The aortic valve is grossly normal. Aortic valve regurgitation is  trivial. No aortic stenosis is present.   7. The inferior vena cava is dilated in size with >50% respiratory  variability, suggesting right atrial pressure of 8 mmHg.   Laboratory Data:  High Sensitivity Troponin:  No results for input(s): TROPONINIHS in the last 720 hours.   Chemistry Recent Labs  Lab 05/05/21 0642  NA 134*  K 3.7  CL 106  CO2 18*  GLUCOSE 110*  BUN 6  CREATININE 0.67  CALCIUM 8.2*  GFRNONAA >60  ANIONGAP 10    Recent Labs  Lab 05/05/21 0642  PROT 5.8*  ALBUMIN 2.5*  AST 25  ALT 12  ALKPHOS 156*  BILITOT 0.5   Lipids No results for input(s): CHOL, TRIG, HDL, LABVLDL, LDLCALC, CHOLHDL in the last 168 hours.  Hematology Recent Labs  Lab 05/05/21 0642  WBC 6.2  RBC 3.57*  HGB 11.0*  HCT 31.3*  MCV 87.7  MCH 30.8  MCHC 35.1  RDW 13.1  PLT 178   Thyroid No results for input(s): TSH, FREET4 in the last 168 hours.  BNPNo results for input(s): BNP, PROBNP in the last 168 hours.  DDimer No results for input(s): DDIMER in the last 168 hours.   Radiology/Studies:  DG  Chest Port 1 View  Result Date: 05/05/2021 CLINICAL DATA:  Placement of central venous catheter. EXAM: PORTABLE CHEST 1 VIEW COMPARISON:  09/10/2016 FINDINGS: Transverse diameter of heart is increased. Central pulmonary vessels are more prominent. There are no signs of alveolar pulmonary edema or focal pulmonary consolidation. There is no pleural effusion or pneumothorax. There is interval placement of right IJ central venous catheter with its tip at the junction of superior vena cava and right atrium. Pacemaker/defibrillator battery is seen in the left infraclavicular region. IMPRESSION: Cardiomegaly. Central pulmonary vessels are more prominent without signs of alveolar pulmonary edema. There is no focal pulmonary consolidation. Tip of right IJ central venous catheter is seen at the junction of superior vena cava and right atrium. There is no pneumothorax. Electronically Signed   By: Ernie Avena M.D.   On: 05/05/2021 11:12     Assessment and Plan:   #HOCM without LVOT obstruction #LAD myocardial bridge  #ICD in place for secondary prevention #Pregnancy -She is currently admitted to the hospital in labor.  Obstetrics believe she will deliver tonight.  She has no evidence of outflow tract obstruction prior to admission and examination is inconsistent with any evidence of outflow tract obstruction. There is no heart failure.  -She has no symptoms of hypertrophic cardiomyopathy prior to pregnancy.  Her LV EF is normal. -She does have an ICD in place but has never received any therapy since her initial event in 2011. -Overall she is low risk for pregnancy. Pre-pregnancy status dictates most of her risk based on registry data, which she has none. There are no high risk features at all.  I discussed this case with obstetrics.  Would recommend to give her fluids as they see fit.  There is no need to withhold fluids in fact we would want to avoid dehydration. -She has received an epidural and will try  to deliver vaginally.  If she needs a C-section anesthesia we will just need to be aware of her fluid shifts.  If pressors are needed would recommend phenylephrine.  Again as long as she is doing well and in no distress would recommend fluids as a first-line treatment. Would recommend to turn off phenylephrine at this time as she has no outflow tract obstruction.  -Cardiology will follow-up tomorrow. Please call with questions.   For questions or updates, please contact [facility] Please consult [email or web address] for contact info under   [employer]. Flora Lipps, MD, [facility]   [facility]  [address], [address] [place], [place] [address] [phone]  5:21 PM

## 2021-05-06 NOTE — Progress Notes (Signed)
Rutherford Nail MICU RN at bedside Will titrate phenylephrine drip

## 2021-05-06 NOTE — Progress Notes (Signed)
Labor Progress Note Sara Lopez is a 26 y.o. G1P0 at [redacted]w[redacted]d who presented for IOL due to history of hypertrophic cardiomyopathy.   S: Doing well. No concerns at this time.  O:  BP 114/76    Pulse 79    Temp 98.4 F (36.9 C) (Oral)    Resp 18    Ht 5\' 5"  (1.651 m)    Wt 93.4 kg    LMP 07/30/2020 (Exact Date)    SpO2 100%    BMI 34.28 kg/m   EFM: Baseline 150 bpm, moderate variability, + accels, intermittent late decels  Toco: Contractions every 2-3 minutes, adequate per IUPC   CVE: Dilation: 8 Effacement (%): 100 Station: Plus 1 Presentation: Vertex Exam by:: Leftwich Kirby CNM  A&P: 26 y.o. G1P0 [redacted]w[redacted]d   #Labor: Progressing well. Contraction pattern now adequate. Will continue current management and reassess 2-3 hours from last check.  #Pain: Epidural  #FWB: Cat 2 due to intermittent late decelerations. Continues to have reassuring variability and accelerations. Will continue to monitor closely. #GBS negative  #HOCM: BP remains stable on Phenylephrine. Cardiology consulted to better assist with managing fluid status. Will follow up recommendations.  [redacted]w[redacted]d, MD 4:18 PM

## 2021-05-06 NOTE — Progress Notes (Addendum)
Sara Lopez is a 26 y.o. G1P0 at [redacted]w[redacted]d by ultrasound admitted for induction of labor at term due to high risk pregnancy including hypertrophic cardiomyopathy, history of MI/VF with pacemaker/ICD in place and a coronary myocardial bridge.  Subjective: Doing well, no concerns at this time. Support team at the bedside resting. We discussed the patient's ongoing induction plan, including the recommendation for her to be started on a phenylephrine drip for more adequate blood pressure control to improve FHT.   Objective: BP 107/72    Pulse 81    Temp 98.7 F (37.1 C) (Oral)    Resp 14    Ht 5\' 5"  (1.651 m)    Wt 93.4 kg    LMP 07/30/2020 (Exact Date)    SpO2 100%    BMI 34.28 kg/m  I/O last 3 completed shifts: In: 3785.7 [P.O.:1545; I.V.:2240.7] Out: 1320 [Urine:1320] Total I/O In: 450 [P.O.:250; I.V.:200] Out: 180 [Urine:180]  FHT:  FHR: 170 bpm, variability: moderate,  accelerations:  Present,  decelerations:  Present Variable UC:   regular, every 3-5 minutes SVE:   Dilation: 6.5 Effacement (%): 70 Station: -1 Exam by:: [email or web address], RN  Labs: Lab Results  Component Value Date   WBC 6.2 05/05/2021   HGB 11.0 (L) 05/05/2021   HCT 31.3 (L) 05/05/2021   MCV 87.7 05/05/2021   PLT 178 05/05/2021    Assessment / Plan: Induction of labor due to high risk pregnancy,  progressing well  Labor: Progressing normally, plan to improve BP control with phenylephrine drip prior to initiating pitocin Preeclampsia:   N/A Fetal Wellbeing:  Category I Pain Control:  Epidural I/D:  n/a Anticipated MOD:  NSVD  07/03/2021 05/06/2021, 12:54 PM  I have seen this patient and agree with the above resident's note.  LEFTWICH-KIRBY, Darletta Noblett Certified Nurse-Midwife

## 2021-05-06 NOTE — Progress Notes (Signed)
RN noted Irregular P intervals on telemetry monitor at approximately 0610. RN [facility] monitoring at 0612, [facility] answered and reviewed patient strip. Lane Hacker reported patient was having occasional PACs & no intervention required. RN will report to Dr. Ephriam Jenkins.

## 2021-05-07 ENCOUNTER — Encounter (HOSPITAL_COMMUNITY): Payer: Self-pay | Admitting: Family Medicine

## 2021-05-07 DIAGNOSIS — Z95810 Presence of automatic (implantable) cardiac defibrillator: Secondary | ICD-10-CM

## 2021-05-07 DIAGNOSIS — I422 Other hypertrophic cardiomyopathy: Secondary | ICD-10-CM | POA: Diagnosis not present

## 2021-05-07 DIAGNOSIS — D649 Anemia, unspecified: Secondary | ICD-10-CM | POA: Diagnosis not present

## 2021-05-07 DIAGNOSIS — Z3490 Encounter for supervision of normal pregnancy, unspecified, unspecified trimester: Secondary | ICD-10-CM | POA: Diagnosis not present

## 2021-05-07 LAB — BASIC METABOLIC PANEL
Anion gap: 6 (ref 5–15)
Anion gap: 7 (ref 5–15)
BUN: 14 mg/dL (ref 6–20)
BUN: 17 mg/dL (ref 6–20)
CO2: 18 mmol/L — ABNORMAL LOW (ref 22–32)
CO2: 20 mmol/L — ABNORMAL LOW (ref 22–32)
Calcium: 7.7 mg/dL — ABNORMAL LOW (ref 8.9–10.3)
Calcium: 8.3 mg/dL — ABNORMAL LOW (ref 8.9–10.3)
Chloride: 104 mmol/L (ref 98–111)
Chloride: 106 mmol/L (ref 98–111)
Creatinine, Ser: 1.23 mg/dL — ABNORMAL HIGH (ref 0.44–1.00)
Creatinine, Ser: 1.25 mg/dL — ABNORMAL HIGH (ref 0.44–1.00)
GFR, Estimated: >60 mL/min (ref >60)
GFR, Estimated: >60 mL/min (ref >60)
Glucose, Bld: 96 mg/dL (ref 70–99)
Glucose, Bld: 81 mg/dL (ref 70–99)
Potassium: 3.9 mmol/L (ref 3.5–5.1)
Potassium: 3.7 mmol/L (ref 3.5–5.1)
Sodium: 128 mmol/L — ABNORMAL LOW (ref 135–145)
Sodium: 133 mmol/L — ABNORMAL LOW (ref 135–145)

## 2021-05-07 LAB — CBC
HCT: 26.4 % — ABNORMAL LOW (ref 36.0–46.0)
HCT: 26.6 % — ABNORMAL LOW (ref 36.0–46.0)
Hemoglobin: 8.9 g/dL — ABNORMAL LOW (ref 12.0–15.0)
Hemoglobin: 9.3 g/dL — ABNORMAL LOW (ref 12.0–15.0)
MCH: 29.6 pg (ref 26.0–34.0)
MCH: 30.3 pg (ref 26.0–34.0)
MCHC: 33.7 g/dL (ref 30.0–36.0)
MCHC: 35 g/dL (ref 30.0–36.0)
MCV: 87.7 fL (ref 80.0–100.0)
MCV: 86.6 fL (ref 80.0–100.0)
Platelets: 140 10*3/uL — ABNORMAL LOW (ref 150–400)
Platelets: 148 10*3/uL — ABNORMAL LOW (ref 150–400)
RBC: 3.01 MIL/uL — ABNORMAL LOW (ref 3.87–5.11)
RBC: 3.07 MIL/uL — ABNORMAL LOW (ref 3.87–5.11)
RDW: 13.1 % (ref 11.5–15.5)
RDW: 13.2 % (ref 11.5–15.5)
WBC: 12.2 10*3/uL — ABNORMAL HIGH (ref 4.0–10.5)
WBC: 11 10*3/uL — ABNORMAL HIGH (ref 4.0–10.5)
nRBC: 0 % (ref 0.0–0.2)
nRBC: 0 % (ref 0.0–0.2)

## 2021-05-07 MED ORDER — SODIUM CHLORIDE 0.9 % IV SOLN
INTRAVENOUS | Status: DC
  Administered 2021-05-07 – 2021-05-08 (×2): 125 mL/h via INTRAVENOUS

## 2021-05-07 MED ORDER — SODIUM CHLORIDE 0.9 % IV SOLN
500.0000 mg | Freq: Once | INTRAVENOUS | Status: DC
  Filled 2021-05-07: qty 25

## 2021-05-07 MED ORDER — SODIUM CHLORIDE 0.9 % IV BOLUS
500.0000 mL | Freq: Once | INTRAVENOUS | Status: AC
  Administered 2021-05-07: 500 mL via INTRAVENOUS

## 2021-05-07 NOTE — Progress Notes (Signed)
Late note Post Partum Day 1 Subjective: CTSP due to low temperature. Pt is completely asymptomatic besides feeling tired from delivery. Denies all ROS including CP/SOB/fever/chills, pain/n/v.   Objective: Blood pressure 97/63, pulse 83, temperature (!) 96.4 F (35.8 C), temperature source Axillary, resp. rate 18, height 5\' 5"  (1.651 m), weight 93.4 kg, last menstrual period 07/30/2020, SpO2 100 %.  Physical Exam:  General: alert, cooperative, and no distress CV: RRR Pulm: CTAB Lochia: appropriate Uterine Fundus: firm -1 Incision: n/a DVT Evaluation: No evidence of DVT seen on physical exam. No cords or calf tenderness.  Recent Labs    05/05/21 0642 05/07/21 0656  HGB 11.0* 8.9*  HCT 31.3* 26.4*    Assessment/Plan: - Will give a 500 cc fluid bolus for borderline HTN - UOP improved. I suspect she was run a little dry yesterday plus with blood loss and third spacing following delivery this is contributing to her Cr. Discussed holding ibuprofen until PM labs return.  - Continue warm blankets - avoid bear hugger due to risk of hypotension - No concern at this time for a source of infection from exam or history and pt looks well - Recheck CBC/BMP at 1500. Discussed with pt and family if HgB goes below 8, I would recommend 1u PRBC given cardiac history. We discussed the risks associated with transfusion and potential benefits. Pt is agreeable to this plan. At this time we are holding the venofer until VS stabilize and labs come back this afternoon.    LOS: 2 days   07/05/21 05/07/2021, 12:43 PM

## 2021-05-07 NOTE — Progress Notes (Signed)
Cardiology Progress Note  Patient ID: Sara Lopez MRN: [id number] DOB: 01-19-1996 Date of Encounter: 05/07/2021  Primary Cardiologist: Thomasene Ripple, DO  Subjective   Chief Complaint: None.   HPI: BP soft but sleeping in bed. Denies CP or SOB. Successful delivery per OB. EBL 600cc.   ROS:  All other ROS reviewed and negative. Pertinent positives noted in the HPI.     Inpatient Medications  Scheduled Meds:  Chlorhexidine Gluconate Cloth  6 each Topical Daily   ibuprofen  600 mg Oral Q6H   measles, mumps & rubella vaccine  0.5 mL Subcutaneous Once   prenatal multivitamin  1 tablet Oral Q1200   senna-docusate  2 tablet Oral Daily   sodium chloride flush  10-40 mL Intracatheter Q12H   Continuous Infusions:  tranexamic acid     PRN Meds: acetaminophen, benzocaine-Menthol, coconut oil, witch hazel-glycerin **AND** dibucaine, diphenhydrAMINE, ondansetron **OR** ondansetron (ZOFRAN) IV, simethicone, sodium chloride flush   Vital Signs   Vitals:   05/06/21 2245 05/06/21 2350 05/07/21 0424 05/07/21 0512  BP: (!) 84/46 (!) 87/49 (!) 84/45 (!) 80/50  Pulse: 100 98 67 (!) 50  Resp: 18 17 18 17   Temp: 98.7 F (37.1 C) (!) 97.5 F (36.4 C) 98.3 F (36.8 C) (!) 96.2 F (35.7 C)  TempSrc: Oral Oral Oral Axillary  SpO2: 98% 97% 97%   Weight:      Height:        Intake/Output Summary (Last 24 hours) at 05/07/2021 0751 Last data filed at 05/07/2021 0200 Gross per 24 hour  Intake 2656.17 ml  Output 825 ml  Net 1831.17 ml   Last 3 Weights 05/05/2021 04/28/2021 04/23/2021  Weight (lbs) 206 lb 202 lb 8 oz 200 lb 9.6 oz  Weight (kg) 93.441 kg 91.853 kg 90.992 kg      Physical Exam   Vitals:   05/06/21 2245 05/06/21 2350 05/07/21 0424 05/07/21 0512  BP: (!) 84/46 (!) 87/49 (!) 84/45 (!) 80/50  Pulse: 100 98 67 (!) 50  Resp: 18 17 18 17   Temp: 98.7 F (37.1 C) (!) 97.5 F (36.4 C) 98.3 F (36.8 C) (!) 96.2 F (35.7 C)  TempSrc: Oral Oral Oral Axillary  SpO2: 98% 97% 97%    Weight:      Height:        Intake/Output Summary (Last 24 hours) at 05/07/2021 0751 Last data filed at 05/07/2021 0200 Gross per 24 hour  Intake 2656.17 ml  Output 825 ml  Net 1831.17 ml    Last 3 Weights 05/05/2021 04/28/2021 04/23/2021  Weight (lbs) 206 lb 202 lb 8 oz 200 lb 9.6 oz  Weight (kg) 93.441 kg 91.853 kg 90.992 kg    Body mass index is 34.28 kg/m.   General: Well nourished, well developed, in no acute distress Head: Atraumatic, normal size  Eyes: PEERLA, EOMI  Neck: Supple, no JVD Endocrine: No thryomegaly Cardiac: Normal S1, S2; RRR; faint SEM Lungs: Clear to auscultation bilaterally, no wheezing, rhonchi or rales  Abd: Soft, nontender, no hepatomegaly  Ext: No edema, pulses 2+ Musculoskeletal: No deformities, BUE and BLE strength normal and equal Skin: Warm and dry, no rashes   Neuro: Alert and oriented to person, place, time, and situation, CNII-XII grossly intact, no focal deficits  Psych: Normal mood and affect   Labs  High Sensitivity Troponin:  No results for input(s): TROPONINIHS in the last 720 hours.   Cardiac EnzymesNo results for input(s): TROPONINI in the last 168 hours. No results for input(s):  TROPIPOC in the last 168 hours.  Chemistry Recent Labs  Lab 05/05/21 0642 05/07/21 0656  NA 134* 128*  K 3.7 3.9  CL 106 104  CO2 18* 18*  GLUCOSE 110* 96  BUN 6 14  CREATININE 0.67 1.23*  CALCIUM 8.2* 7.7*  PROT 5.8*  --   ALBUMIN 2.5*  --   AST 25  --   ALT 12  --   ALKPHOS 156*  --   BILITOT 0.5  --   GFRNONAA >60 >60  ANIONGAP 10 6    Hematology Recent Labs  Lab 05/05/21 0642 05/07/21 0656  WBC 6.2 12.2*  RBC 3.57* 3.01*  HGB 11.0* 8.9*  HCT 31.3* 26.4*  MCV 87.7 87.7  MCH 30.8 29.6  MCHC 35.1 33.7  RDW 13.1 13.1  PLT 178 140*   BNPNo results for input(s): BNP, PROBNP in the last 168 hours.  DDimer No results for input(s): DDIMER in the last 168 hours.   Radiology  DG Chest Port 1 View  Result Date: 05/05/2021 CLINICAL DATA:   Placement of central venous catheter. EXAM: PORTABLE CHEST 1 VIEW COMPARISON:  09/10/2016 FINDINGS: Transverse diameter of heart is increased. Central pulmonary vessels are more prominent. There are no signs of alveolar pulmonary edema or focal pulmonary consolidation. There is no pleural effusion or pneumothorax. There is interval placement of right IJ central venous catheter with its tip at the junction of superior vena cava and right atrium. Pacemaker/defibrillator battery is seen in the left infraclavicular region. IMPRESSION: Cardiomegaly. Central pulmonary vessels are more prominent without signs of alveolar pulmonary edema. There is no focal pulmonary consolidation. Tip of right IJ central venous catheter is seen at the junction of superior vena cava and right atrium. There is no pneumothorax. Electronically Signed   By: Ernie Avena M.D.   On: 05/05/2021 11:12    Cardiac Studies  TTE 04/03/2021  1. Left ventricular ejection fraction, by estimation, is 65 to 70%. The  left ventricle has normal function. The left ventricle has no regional  wall motion abnormalities. There is severe asymmetric left ventricular  hypertrophy of the septal segment  (18-19 mm at mid ventricular septum). LVOT flow acceleration with maximum  instantaneous gradient of 22 mmHg, with systolic anterior motion of mitral  valve. Morphology may be best described as reverse curve HCM. Left  ventricular diastolic parameters are  consistent with Grade II diastolic dysfunction (pseudonormalization).   2. Right ventricular systolic function is mildly reduced. The right  ventricular size is normal. Tricuspid regurgitation signal is inadequate  for assessing PA pressure.   3. Left atrial size was severely dilated.   4. A small pericardial effusion is present. The pericardial effusion is  circumferential. There is no evidence of cardiac tamponade.   5. Systolic anterior motion of the mitral valve.. The mitral valve is   grossly normal. Mild-moderate and posteriorly directed mitral valve  regurgitation. No evidence of mitral stenosis.   6. The aortic valve is grossly normal. Aortic valve regurgitation is  trivial. No aortic stenosis is present.   7. The inferior vena cava is dilated in size with >50% respiratory  variability, suggesting right atrial pressure of 8 mmHg.   Patient Profile  Sara Lopez is a 26 y.o. female with hypertrophic cardiomyopathy status post ICD, LAD myocardial bridge who was admitted on 05/05/2021 for induction of labor.  Assessment & Plan   #Hypertrophic cardiomyopathy without LVOT obstruction #LAD myocardial bridge #ICD in place #Pregnancy -Did well with delivery overnight.  Denies chest pain or trouble breathing.  Blood pressure a bit soft this morning but she is sleeping.  Blood pressure prior to delivery was in the low 100s.  This is not surprising. No signs of shock or decompensation. 2g drop in hemoglobin as well.  -Would recommend good hydration.  Can increase oral intake. -Pulse regular and low on exam.  No symptoms.  Exam largely unchanged from yesterday. Murmur present. Labs with anemia and slight AKI.  -Overall she has done well.  Would recommend she continue with adequate oral hydration.  Again, her echo showed no evidence of LVOT obstruction.  She has normal LVEF.  She does not have any symptoms of heart failure.  She overall has done well and I suspect will continue to do well. Baby girl has been delivered and is doing well.  -LAD myocardial bridge has no impact on her ability to deliver.  This is a benign entity. -Cardiology will follow along while here.   #Anemia -2/2 blood loss. Fluids given back per OB  #Hyponatremia #AKI -suspect 2/2 delivery and ADH response. Overall, doing well and no signs of CHF.  -would trend. -Cardiology to follow along.   For questions or updates, please contact [facility] Please consult [email or web address] for contact info under    Time Spent with Patient: I have spent a total of 25 minutes with patient reviewing hospital notes, telemetry, EKGs, labs and examining the patient as well as establishing an assessment and plan that was discussed with the patient.  > 50% of time was spent in direct patient care.    Signed, Lenna GilfordWesley T. Flora Lipps'Neal, MD, [facility]ACC [place]   [facility]  05/07/2021 7:51 AM

## 2021-05-07 NOTE — Significant Event (Signed)
Rapid Response Event Note   Reason for Call :  Unable to obtain axillary or oral temperature.  Initial Focused Assessment:  Patient is alert and oriented.  She is mildly cool to the touch but skin is dry.   Lung sounds clear  BP 79/50  SR 66  RR 18  O2 sat 100% on RA  Rectal temp 96.1  Interventions:  500cc NS bolus Warm blankets  Plan of Care:  RN to call if further assistance needed  Event Summary:   MD Notified: Dr Milas Hock at bedside Call Time: 0856 Arrival Time: 0900 End Time: 0945  Marcellina Millin, RN

## 2021-05-07 NOTE — Progress Notes (Signed)
POSTPARTUM PROGRESS NOTE  Post Partum Day 1  Subjective:  Sara Lopez is a 26 y.o. G1P0 s/p SVD at [redacted]w[redacted]d.  She reports she is doing well. No acute events overnight. She denies any problems with ambulating, voiding or po intake. Denies nausea or vomiting.  Pain is well controlled.  Lochia is normal.  Objective: Blood pressure (!) 80/50, pulse (!) 50, temperature (!) 96.2 F (35.7 C), temperature source Axillary, resp. rate 17, height 5\' 5"  (1.651 m), weight 93.4 kg, last menstrual period 07/30/2020, SpO2 97 %.  Physical Exam:  General: alert, cooperative and no distress Chest: no respiratory distress Heart:regular rate, distal pulses intact Abdomen: soft, nontender,  Uterine Fundus: firm, appropriately tender DVT Evaluation: No calf swelling or tenderness Extremities: minimal edema Skin: warm, dry  Recent Labs    05/05/21 0642 05/07/21 0656  HGB 11.0* 8.9*  HCT 31.3* 26.4*    Assessment/Plan: Sara Lopez is a 26 y.o. G1P0 s/p SVD at [redacted]w[redacted]d   PPD#1 - Doing well  Routine postpartum care Continue routine care, appreciate cardiology assistance If ok with cards, remove A line today  Monitor urine output Encourage ambulation  LOS: 2 days   [redacted]w[redacted]d, MD Faculty attending 05/07/2021, 7:58 AM

## 2021-05-07 NOTE — Lactation Note (Signed)
This note was copied from a baby's chart. Lactation Consultation Note  Patient Name: Sara Lopez JJHER'D Date: 05/07/2021 Reason for consult: Follow-up assessment;Mother's request;Difficult latch Age:27 hours Term female infant with -2% weight loss. Previously there was long period of  time that infant was not feed earlier see flow sheet. LC did a lot of teaching with parents on infant feeding. Parents understand to watch hunger cues, BF infant 8 to 12 times within 24 hours, and ask RN/LC for latch assistance if needed. LC ask mom to pre-pump breast with hand pump prior to latching infant, to help evert nipple shaft out more, mom fitted with 24 mm NS that was pre-filled with 0.5 mls of formula only once. Mom latched infant on her left breast using the football hold position infant latched with depth and BF for 15 minutes afterwards dad assisted mom with pre-pumping and applying 24 mm BS on mom's right breast. Infant BF for total of 23 minutes, afterwards dad supplemented infant with 10 mls of formula using a Nfant nipple ( clear and purple). While dad pace feed infant, mom used the DEBP and was expressing colostrum in both pump flanges while pumping. LC reviewed breast milk safe 4 hours at room temperature whereas formula must be used 1 hour once opened. Mom's current feeding plan: 1-Mom will pre-pump breast with hand pump, apply 24 mm NS and BF infant according to feeding cues, 8 to 12+ times within 24 hours, skin to skin. 2- Mom will ask RN/LC for assistance with applying 24 mm NS or latching infant at the breast if needed. 3- Mom will offer infant her EBM first before offering formula after latching infant at the breast. 4- Mom will follow supplemental breastfeeding guideline sheet given by [facility] and understands to increase volume on day 2 of infant's life afte latching infant at the breast. 5- Mom will continue to pump every 3 hours for 15 minutes on initial setting.  Maternal  Data Does the patient have breastfeeding experience prior to this delivery?: No  Feeding Mother's Current Feeding Choice: Breast Milk and Formula  LATCH Score Latch: Grasps breast easily, tongue down, lips flanged, rhythmical sucking. (colostrum present in NS when infant came off the breast.)  Audible Swallowing: Spontaneous and intermittent  Type of Nipple: Inverted  Comfort (Breast/Nipple): Soft / non-tender  Hold (Positioning): Assistance needed to correctly position infant at breast and maintain latch.  LATCH Score: 7   Lactation Tools Discussed/Used Tools: Pump;Nipple Shields Nipple shield size: 24 (Mom has inverted nipples and infant has not been sustaining latch previously.) Breast pump type: Double-Electric Breast Pump Pump Education: Setup, frequency, and cleaning;Milk Storage Reason for Pumping: Infant previously not sustain latch at the breast. Pumping frequency: Mom knows to pump every 3 hours for 15 minutes on inital setting. Pumped volume: 2 mL (Mom was still expressing colostrum when LC left the room.)  Interventions Interventions: Assisted with latch;Skin to skin;Breast compression;Adjust position;Support pillows;Position options;Expressed milk;Pre-pump if needed;Education;Hand pump;Pace feeding  Discharge    Consult Status Consult Status: Follow-up Date: 05/08/21 Follow-up type: In-patient    Danelle Earthly 05/07/2021, 4:34 PM

## 2021-05-07 NOTE — Progress Notes (Addendum)
CLINICAL SOCIAL WORK MATERNAL/CHILD NOTE  Patient Details  Name: Sara Lopez MRN: [id number] Date of Birth: 05/06/2022  Date:  05/07/2021  Clinical Social Worker Initiating Note:  Laurey Arrow Date/Time: Initiated:  05/07/21/1316     Child's Name:  Sara Lopez   Biological Parents:  Mother, Father   Need for Interpreter:  None   Reason for Referral:  Behavioral Health Concerns, Other (Comment) (ADHD and developmental delays.)   Address:  [place] [address]    Phone number:  [phone] (home)     Additional phone number: [phone]  Household Members/Support Persons (HM/SP):   Household Member/Support Person 1 (Per MOB, she and FOB resides with FOB's parents.)   HM/SP Name Relationship DOB or Age  HM/SP -1 Ben White FOB 10/12/1991  HM/SP -2        HM/SP -3        HM/SP -4        HM/SP -5        HM/SP -6        HM/SP -7        HM/SP -8          Natural Supports (not living in the home):  Extended Family, Immediate Family, Parent   Professional Supports: None (MOB declined community resources for parenting education and outpatient [place] counseling.)   Employment: Unemployed   Type of Work:     Education:  Programmer, systems   Homebound arranged:    Museum/gallery curator Resources:  [employer]   Other Resources:  [employer], Physicist, medical     Cultural/Religious Considerations Which May Impact Care:  Per [employer] Sheet, MOB is Protestant.  Strengths:  Pediatrician chosen, Ability to meet basic needs  , Home prepared for child  , Understanding of illness   Psychotropic Medications:         Pediatrician:    [place] area (MOB was unable to recall peds practice but communicated one has been identified.)  Pediatrician List:   [place]      Pediatrician Fax Number:    Risk Factors/Current Problems:  Mental Health Concerns      Cognitive State:  Able to Concentrate  , Alert  , Insightful  , Linear Thinking  , Goal Oriented     Mood/Affect:  Comfortable  , Interested  , Happy  , Relaxed     CSW Assessment: CSW met with MOB in room 104 to complete an assessment for MH hx and cognitive delays.  When CSW arrived, MOB was resting in bed, FOB and MOB's mother was sitting on the couch observing infant in her bassinet; everyone appeared happy and comfortable. CSW explained CSW's role and with MOB's permission, CSW asked MOB's guest to leave the room in order to assess MOB in private.   CSW inquired about MOB's MH hx and MOB acknowledged a hx of anxiety and depression and reported that she was dx in 2011. Per MOB, she is not currently on any medication and she is not in counseling.  MOB communicated hat she has not had any MH symptoms in over 3 years. MOB attributed her MH symptoms to her birth control. Per MOB when she discontinued her birth control her symptoms subsided. CSW educated MOB about PMADs. CSW informed MOB of possible supports and interventions to decrease PPD.  CSW also encouraged MOB to  seek medical attention if needed for increased signs and symptoms of PPD.  CSW also offered MOB resources for outpatient behavioral health services and MOB declined. CSW encouraged MOB to evaluate her mental health and notify a medical professional if symptoms arise; MOB agreed. MOB presented with insight and awareness and denied SI, HI, and DV when assessed for safety. MOB reported having a good support team that will be willing to help if needed. CSW reviewed safe sleep and SIDS. MOB and was knowledgeable and asked appropriate questions. MOB communicated that MOB has everything she needs for the baby and is prepared to meet her infant's needs.  MOB did not have any further questions, concerns, or needs currently. CSW thanked MOB for allowing CSW to meet with her.   CSW received and acknowledges consult for Substance use.  Consult  screened out due to MOB's SA hx does not warrant a CSW consult.    There are no barriers to discharge.  CSW Plan/Description:  No Further Intervention Required/No Barriers to Discharge, Sudden Infant Death Syndrome (SIDS) Education, Perinatal Mood and Anxiety Disorder (PMADs) Education, Other Patient/Family Education, Other Information/Referral to [employer], MSW, [place] Work [phone]

## 2021-05-07 NOTE — Anesthesia Postprocedure Evaluation (Signed)
Anesthesia Post Note  Patient: Sara Lopez  Procedure(s) Performed: AN AD HOC LABOR EPIDURAL     Patient location during evaluation: Mother Baby Anesthesia Type: Epidural Level of consciousness: awake and alert and oriented Pain management: satisfactory to patient Vital Signs Assessment: post-procedure vital signs reviewed and stable Respiratory status: respiratory function stable Cardiovascular status: stable Postop Assessment: no headache, no backache, epidural receding, patient able to bend at knees, no signs of nausea or vomiting, adequate PO intake and able to ambulate Anesthetic complications: no   No notable events documented.  Last Vitals:  Vitals:   05/07/21 0424 05/07/21 0512  BP: (!) 84/45 (!) 80/50  Pulse: 67 (!) 50  Resp: 18 17  Temp: 36.8 C (!) 35.7 C  SpO2: 97%     Last Pain:  Vitals:   05/07/21 0512  TempSrc: Axillary  PainSc:    Pain Goal: Patients Stated Pain Goal: 0 (05/06/21 3762)                 Karleen Dolphin

## 2021-05-07 NOTE — Lactation Note (Signed)
This note was copied from a baby's chart. Lactation Consultation Note  Patient Name: Sara Lopez ACZYS'A Date: 05/07/2021 Reason for consult: Initial assessment Age:27 hours  Mom is a P1 Mom reports + breast changes w/pregnancy. Infant had not fed in a number of hours, so latching was attempted, but without success.  Bottle feeding was done. The yellow Similac nipple was too fast. The NFant extra-slow flow nipple was used b/c there was no availability of the Nfant Standard flow at that time . Sara Lopez, SLP was notified of its usage .  When infant cries, an occasional high-pitched noise can be heard. Infant pink & no tachypnea noted during consult.   Consult was ended due to Rapid Nurse Response for maternal hypothermia.    Sara Lopez [facility] 05/07/2021, 9:16 AM

## 2021-05-08 ENCOUNTER — Other Ambulatory Visit (HOSPITAL_COMMUNITY): Payer: Self-pay

## 2021-05-08 DIAGNOSIS — I422 Other hypertrophic cardiomyopathy: Secondary | ICD-10-CM | POA: Diagnosis not present

## 2021-05-08 DIAGNOSIS — Z3490 Encounter for supervision of normal pregnancy, unspecified, unspecified trimester: Secondary | ICD-10-CM | POA: Diagnosis not present

## 2021-05-08 DIAGNOSIS — D649 Anemia, unspecified: Secondary | ICD-10-CM | POA: Diagnosis not present

## 2021-05-08 DIAGNOSIS — Z95810 Presence of automatic (implantable) cardiac defibrillator: Secondary | ICD-10-CM | POA: Diagnosis not present

## 2021-05-08 LAB — CBC
HCT: 22.4 % — ABNORMAL LOW (ref 36.0–46.0)
Hemoglobin: 7.8 g/dL — ABNORMAL LOW (ref 12.0–15.0)
MCH: 30.2 pg (ref 26.0–34.0)
MCHC: 34.8 g/dL (ref 30.0–36.0)
MCV: 86.8 fL (ref 80.0–100.0)
Platelets: 138 10*3/uL — ABNORMAL LOW (ref 150–400)
RBC: 2.58 MIL/uL — ABNORMAL LOW (ref 3.87–5.11)
RDW: 13.2 % (ref 11.5–15.5)
WBC: 9.5 10*3/uL (ref 4.0–10.5)
nRBC: 0 % (ref 0.0–0.2)

## 2021-05-08 LAB — BASIC METABOLIC PANEL
Anion gap: 6 (ref 5–15)
BUN: 14 mg/dL (ref 6–20)
CO2: 18 mmol/L — ABNORMAL LOW (ref 22–32)
Calcium: 7.7 mg/dL — ABNORMAL LOW (ref 8.9–10.3)
Chloride: 108 mmol/L (ref 98–111)
Creatinine, Ser: 0.95 mg/dL (ref 0.44–1.00)
GFR, Estimated: >60 mL/min (ref >60)
Glucose, Bld: 75 mg/dL (ref 70–99)
Potassium: 3.6 mmol/L (ref 3.5–5.1)
Sodium: 132 mmol/L — ABNORMAL LOW (ref 135–145)

## 2021-05-08 LAB — ABO/RH: ABO/RH(D): B POS

## 2021-05-08 LAB — PREPARE RBC (CROSSMATCH)

## 2021-05-08 MED ORDER — ACETAMINOPHEN 325 MG PO TABS
650.0000 mg | ORAL_TABLET | ORAL | 0 refills | Status: DC | PRN
  Filled 2021-05-08: qty 60, 5d supply, fill #0

## 2021-05-08 MED ORDER — IBUPROFEN 600 MG PO TABS
600.0000 mg | ORAL_TABLET | Freq: Four times a day (QID) | ORAL | 0 refills | Status: DC
  Filled 2021-05-08: qty 30, 8d supply, fill #0

## 2021-05-08 MED ORDER — SODIUM CHLORIDE 0.9% IV SOLUTION: Freq: Once | INTRAVENOUS | Status: AC

## 2021-05-08 NOTE — Progress Notes (Addendum)
Cardiology Progress Note  Patient ID: Sara Lopez MRN: [id number] DOB: March 02, 1996 Date of Encounter: 05/08/2021  Primary Cardiologist: Thomasene RippleKardie Tobb, DO  Subjective   Chief Complaint: None.   HPI: T-max 100.1.  Denies chest pain or trouble breathing.  Has gotten up to go to the bathroom without limitations.  Good urine output.  Hemoglobin drifted down to 7.8.  Likely acceptable.  Again doing well.  Ready to go home.  ROS:  All other ROS reviewed and negative. Pertinent positives noted in the HPI.     Inpatient Medications  Scheduled Meds:  Chlorhexidine Gluconate Cloth  6 each Topical Daily   ibuprofen  600 mg Oral Q6H   measles, mumps & rubella vaccine  0.5 mL Subcutaneous Once   prenatal multivitamin  1 tablet Oral Q1200   senna-docusate  2 tablet Oral Daily   Continuous Infusions:  sodium chloride 125 mL/hr (05/08/21 0328)   iron sucrose Stopped (05/07/21 1140)   PRN Meds: acetaminophen, benzocaine-Menthol, coconut oil, witch hazel-glycerin **AND** dibucaine, diphenhydrAMINE, ondansetron **OR** ondansetron (ZOFRAN) IV, simethicone   Vital Signs   Vitals:   05/07/21 1645 05/07/21 1944 05/07/21 2309 05/08/21 0329  BP: (!) 89/55 (!) 103/57 97/63 94/60   Pulse: 71 100 (!) 103 94  Resp: 17 18 18 18   Temp: (!) 97.4 F (36.3 C) 98.4 F (36.9 C) 100.2 F (37.9 C) 100.1 F (37.8 C)  TempSrc: Oral Oral Oral Oral  SpO2: 100% 100% 99% 95%  Weight:      Height:        Intake/Output Summary (Last 24 hours) at 05/08/2021 0741 Last data filed at 05/08/2021 0534 Gross per 24 hour  Intake 4500.86 ml  Output 3450 ml  Net 1050.86 ml   Last 3 Weights 05/05/2021 04/28/2021 04/23/2021  Weight (lbs) 206 lb 202 lb 8 oz 200 lb 9.6 oz  Weight (kg) 93.441 kg 91.853 kg 90.992 kg      Physical Exam   Vitals:   05/07/21 1645 05/07/21 1944 05/07/21 2309 05/08/21 0329  BP: (!) 89/55 (!) 103/57 97/63 94/60   Pulse: 71 100 (!) 103 94  Resp: 17 18 18 18   Temp: (!) 97.4 F (36.3 C) 98.4  F (36.9 C) 100.2 F (37.9 C) 100.1 F (37.8 C)  TempSrc: Oral Oral Oral Oral  SpO2: 100% 100% 99% 95%  Weight:      Height:        Intake/Output Summary (Last 24 hours) at 05/08/2021 0741 Last data filed at 05/08/2021 0534 Gross per 24 hour  Intake 4500.86 ml  Output 3450 ml  Net 1050.86 ml    Last 3 Weights 05/05/2021 04/28/2021 04/23/2021  Weight (lbs) 206 lb 202 lb 8 oz 200 lb 9.6 oz  Weight (kg) 93.441 kg 91.853 kg 90.992 kg    Body mass index is 34.28 kg/m.  General: Well nourished, well developed, in no acute distress Head: Atraumatic, normal size  Eyes: PEERLA, EOMI  Neck: Supple, no JVD Endocrine: No thryomegaly Cardiac: Normal S1, S2; RRR; faint systolic ejection murmur Lungs: Clear to auscultation bilaterally, no wheezing, rhonchi or rales  Abd: Soft, nontender, no hepatomegaly  Ext: No edema, pulses 2+ Musculoskeletal: No deformities, BUE and BLE strength normal and equal Skin: Warm and dry, no rashes   Neuro: Alert and oriented to person, place, time, and situation, CNII-XII grossly intact, no focal deficits  Psych: Normal mood and affect   Labs  High Sensitivity Troponin:  No results for input(s): TROPONINIHS in the last 720 hours.  Cardiac EnzymesNo results for input(s): TROPONINI in the last 168 hours. No results for input(s): TROPIPOC in the last 168 hours.  Chemistry Recent Labs  Lab 05/05/21 0642 05/07/21 0656 05/07/21 1458 05/08/21 0434  NA 134* 128* 133* 132*  K 3.7 3.9 3.7 3.6  CL 106 104 106 108  CO2 18* 18* 20* 18*  GLUCOSE 110* 96 81 75  BUN 6 14 17 14   CREATININE 0.67 1.23* 1.25* 0.95  CALCIUM 8.2* 7.7* 8.3* 7.7*  PROT 5.8*  --   --   --   ALBUMIN 2.5*  --   --   --   AST 25  --   --   --   ALT 12  --   --   --   ALKPHOS 156*  --   --   --   BILITOT 0.5  --   --   --   GFRNONAA >60 >60 >60 >60  ANIONGAP 10 6 7 6     Hematology Recent Labs  Lab 05/07/21 0656 05/07/21 1458 05/08/21 0434  WBC 12.2* 11.0* 9.5  RBC 3.01* 3.07* 2.58*   HGB 8.9* 9.3* 7.8*  HCT 26.4* 26.6* 22.4*  MCV 87.7 86.6 86.8  MCH 29.6 30.3 30.2  MCHC 33.7 35.0 34.8  RDW 13.1 13.2 13.2  PLT 140* 148* 138*   BNPNo results for input(s): BNP, PROBNP in the last 168 hours.  DDimer No results for input(s): DDIMER in the last 168 hours.   Radiology  No results found.  Cardiac Studies  TTE 04/03/2021  1. Left ventricular ejection fraction, by estimation, is 65 to 70%. The  left ventricle has normal function. The left ventricle has no regional  wall motion abnormalities. There is severe asymmetric left ventricular  hypertrophy of the septal segment  (18-19 mm at mid ventricular septum). LVOT flow acceleration with maximum  instantaneous gradient of 22 mmHg, with systolic anterior motion of mitral  valve. Morphology may be best described as reverse curve HCM. Left  ventricular diastolic parameters are  consistent with Grade II diastolic dysfunction (pseudonormalization).   2. Right ventricular systolic function is mildly reduced. The right  ventricular size is normal. Tricuspid regurgitation signal is inadequate  for assessing PA pressure.   3. Left atrial size was severely dilated.   4. A small pericardial effusion is present. The pericardial effusion is  circumferential. There is no evidence of cardiac tamponade.   5. Systolic anterior motion of the mitral valve.. The mitral valve is  grossly normal. Mild-moderate and posteriorly directed mitral valve  regurgitation. No evidence of mitral stenosis.   6. The aortic valve is grossly normal. Aortic valve regurgitation is  trivial. No aortic stenosis is present.   7. The inferior vena cava is dilated in size with >50% respiratory  variability, suggesting right atrial pressure of 8 mmHg.   Patient Profile  Sara Lopez is a 26 y.o. female with hypertrophic cardiomyopathy status post ICD, LAD myocardial bridge who was admitted on 05/05/2021 for induction of labor.  Assessment & Plan    #Hypertrophic cardiomyopathy without LVOT obstruction #LAD myocardial bridge #ICD in place #Pregnancy with delivery -Continues to do well.  BPs have improved.  Denies symptoms.  Exam unchanged.  No symptoms of LVOT obstruction.  She is doing well. -Would recommend good oral hydration.  Suspect she is ready for discharge today. -AKI has resolved.  Hyponatremia has resolved. -No need for any medications.  We will just recommend good oral intake. -LAD myocardial bridge is  benign.  #Anemia -Hemoglobin 7.8.  Suspect this is secondary to delivery. -Hemoglobin goal of 7 is fine for her.  She has hypertrophic cardiomyopathy.  She does not have coronary disease.  Do not need to transfuse her for a goal of 8. -Would recommend iron supplementation if okay by her obstetrician.  #Hyponatremia/AKI -2/2 delivery. Resolved.   [facility] will sign off.   Medication Recommendations:  None.  Other recommendations (labs, testing, etc):  None.  Follow up as an outpatient:  Regular Follow-up with Dr. Servando Salina. Please call with questions.   For questions or updates, please contact [facility] Please consult [email or web address] for contact info under     Signed, Gerri Spore T. Flora Lipps, MD, [facility]   [facility]  05/08/2021 7:41 AM

## 2021-05-08 NOTE — Lactation Note (Addendum)
This note was copied from a baby's chart. Lactation Consultation Note  Patient Name: Sara Lopez [id number] Date: 05/08/2021 Reason for consult: Follow-up assessment;Mother's request;Difficult latch;Breastfeeding assistance;Other (Comment) (Mom in midst of a blood transfustion.) Age:27 hours  Mom offering infant formula at this time given in the midst of a blood transfusion. Mom aware importance for her to pump DEBP q 3hrs for 15 min to maintain her milk supply.   LC reviewed Volume guide based on hrs of age infant since delivery and with infant not latching parents are aware to offer 15-30 ml per feeding. Mom to give EBM first before formula, and if infant still cueing give more.   Infant noted to have 6 % weight loss. Parents stated infant picking up her volume since 8 am feeding with her taking 25 ml or more. Infant 2 stools and 2 urine today according to parents.   Mom denied any discomfort with current use of 24 flanges.   Mom placed prn and when she is feeling better she will call for latch assistance at the breast.   Maternal Data    Feeding Mother's Current Feeding Choice: Breast Milk and Formula  LATCH Score                    Lactation Tools Discussed/Used Tools: Pump;Flanges Flange Size: 24 Breast pump type: Double-Electric Breast Pump Pump Education: Setup, frequency, and cleaning;Milk Storage Reason for Pumping: increase stimulation Pumping frequency: every 3 hrs for 15 min  Interventions Interventions: Breast feeding basics reviewed;Expressed milk;DEBP;Education;Infant Driven Feeding Algorithm education  Discharge Discharge Education: Engorgement and breast care  Consult Status Consult Status: PRN Date: 05/09/21 Follow-up type: In-patient    Jaida Basurto  Nicholson-Springer 05/08/2021, 1:47 PM

## 2021-05-09 LAB — TYPE AND SCREEN
ABO/RH(D): B POS
Antibody Screen: NEGATIVE
Unit division: 0

## 2021-05-09 LAB — BPAM RBC
Blood Product Expiration Date: 202301172359
ISSUE DATE / TIME: 202301061032
Unit Type and Rh: 7300

## 2021-05-11 ENCOUNTER — Telehealth: Payer: Self-pay

## 2021-05-11 NOTE — Telephone Encounter (Signed)
Transition Care Management Unsuccessful Follow-up Telephone Call  Date of discharge and from where:  05/08/2021-[facility]  Attempts:  1st Attempt  Reason for unsuccessful TCM follow-up call:  Left voice message

## 2021-05-12 ENCOUNTER — Ambulatory Visit (INDEPENDENT_AMBULATORY_CARE_PROVIDER_SITE_OTHER): Payer: Medicaid Other

## 2021-05-12 ENCOUNTER — Other Ambulatory Visit: Payer: Self-pay

## 2021-05-12 VITALS — BP 112/73 | HR 75 | Wt 205.5 lb

## 2021-05-12 DIAGNOSIS — Z0130 Encounter for examination of blood pressure without abnormal findings: Secondary | ICD-10-CM

## 2021-05-12 NOTE — Progress Notes (Signed)
Blood Pressure Check Visit  Sara Lopez is here for blood pressure check following vacuum-assisted vaginal delivery on 05/06/21. BP today is 112/73. Patient denies any dizziness, blurred vision, headache, shortness of breath, peripheral edema. Patient denies any questions or concerns. I reminded patient of postpartum appointment date and time.  Seth Bake, RN 05/12/2021

## 2021-05-12 NOTE — Telephone Encounter (Signed)
Transition Care Management Unsuccessful Follow-up Telephone Call  Date of discharge and from where:  05/08/2021-[facility]  Attempts:  2nd Attempt  Reason for unsuccessful TCM follow-up call:  Left voice message

## 2021-05-12 NOTE — Patient Instructions (Signed)
AREA PEDIATRIC/FAMILY PRACTICE PHYSICIANS  Central/[place]outheast [place] ([address])  [facility] Chambliss, MD; Eniola, MD; Hale, MD; Hensel, MD; McDiarmid, MD; McIntyer, MD; Neal, MD; Walden, MD [address] [place], [place] [address] [phone] Mon-Fri 8:30-12:30, 1:30-5:00 Providers come to see babies at [facility] Accepting Medicaid [facility] providers who accept newborns: Koirala, MD; Morrow, MD; Wolters, MD [address], [place], [place] [address] [phone] Mon-Fri 8:00-5:30 Babies seen by providers at [facility] Does NOT accept Medicaid Please call early in hospitalization for appointment (limited availability)  [facility] Mulberry, MD [address] [place], [place] [address] [phone] Mon, Tue, Thur, Fri 8:30-5:00, Wed 10:00-7:00 (closed 1-2pm) Babies seen by [facility] providers Accepting Medicaid Rubin - Pediatrician Rubin, MD [address], [place], [place] [address] [phone] Mon-Fri 8:30-5:00, Sat 8:30-12:00 Provider comes to see babies at [facility] Accepting Medicaid Must have been referred from current patients or contacted office prior to delivery [facility] ([facility]) Brown, MD; Chandler, MD; Ettefagh, MD; Grant, MD; Lester, MD; McCormick, MD; McQueen, MD; Prose, MD; Simha, MD; Stanley, MD; Stryffeler, NP; Tebben, NP [address], [place], [place] [address] [phone] Mon, Tue, Thur, Fri 8:30-5:30, Wed 9:30-5:30, Sat 8:30-12:30 Babies seen by [facility] providers Accepting Medicaid Only accepting infants of first-time parents or siblings of current patients Hospital discharge coordinator will make follow-up appointment Jack Amos [address], [place], [place]  [address] [phone]   Fax - [phone] [facility] [address], [place], [place]  [address] Phone - [phone]   Fax - [phone] Shilpa Gosrani [address], [place], [place]  [address] [phone]  [place]ortheast [place] ([address]) [facility] Bates, MD; [place], MD; Cooper, Cox, MD; MD; Davis, MD; Dovico, MD; Ettefaugh, MD; Little, MD; Lowe, MD; Keiffer, MD; Melvin, MD; Sumner, MD; Williams, MD [address], [place], [place] [address] [phone] Mon-Fri 8:30-5:00 (extended evenings Mon-Thur as needed), Sat-Sun 10:00-1:00 Providers come to see babies at [facility] Accepting Medicaid for families of first-time babies and families with all children in the household age 3 and under. Must register with office prior to making appointment (M-F only). [facility] Henson, NP; Knapp, MD; Lalonde, MD; Tysinger, PA [address] [place], [place] [address] [phone] Mon-Fri 8:00-5:00 Babies seen by providers at [facility] Does NOT accept Medicaid/Commercial Insurance Only [facility] ([facility])  Artis, MD; Barnes, MD; Bratton, MD; Coccaro, MD; [place] Gardner, MD; Kramer, MD; Marshall, MD; Netherton, MD; Poleto, MD; Skinner, MD [address] [place], [place] [address] [phone] Mon-Fri 8:30-5:30, Sat (Oct.-Mar.) 9:00-1:00 Babies seen by providers at [facility] Accepting Medicaid  [place] ([address]) [facility] Reid, MD; Warner, MD [address], [place], [place] [address] [phone] Mon-Fri 8:30-5:00, Sat 8:30-12:00 Providers come to see babies at [facility] Does NOT accept Medicaid [facility] Becker, PA; Hagler, MD; Scifres, PA; Sun, MD; Swayne, MD [address], [place],  [address] [phone] Mon-Fri 8:00-5:00 Babies seen by providers at [facility] Does NOT accept Medicaid Only accepting babies of parents who  are patients Please call early in hospitalization for appointment (limited availability)  Pediatricians Clark, MD; Frye, MD; Kelleher, MD; Mack, NP; Miller, MD; O'Keller, MD; Patterson, NP; Pudlo, MD; Puzio, MD; Thomas, MD; Tucker, MD; Twiselton, MD [address], [place], [place] [address] [phone] Mon-Fri 8:00-5:00, Sat 9:00-12:00 Providers come to see babies at [facility] Does NOT accept Medicaid  [place] ([address]) [facility] providers accepting new patients: Brake, NP; Wharton, PA [address], [place], [place] [address] [phone] Mon-Fri 8:00-5:00 Babies seen by providers at [facility] Does NOT accept Medicaid Only accepting babies of parents who are patients Please call early in hospitalization for appointment (limited availability) [facility] Gay, MD; Quinlan, MD [address] [place], [place] [address] [phone] (press 1 to schedule appointment) Mon-Fri 8:00-5:00 Providers come to see babies at [facility] Does NOT accept Medicaid [facility] Mazer, MD [address] [place], [place] [address] [phone] Mon-Fri 8:30-5:00 (lunch 12:30-1:00), extended hours by appointment only Wed 5:00-6:30 Babies seen by [facility] providers Accepting Medicaid [facility] Banks, MD; Jordan, MD; Koberlein, MD [address], [place], [place] [address] [phone] Mon-Fri 8:00-5:00 Babies seen by [facility] providers Does NOT accept Medicaid [facility] Parker, MD; Hunter, MD; Wallace, DO [address] [place], [place] [address] [phone] Mon-Fri 8:00-5:00 Babies seen by [facility] providers Does NOT accept Medicaid [facility] Brandon, PA; Brecken, PA; Christy, NP; Dees, MD; DeClaire, MD; DeWeese, MD; Hansen, NP; Mills, NP; Parrish, NP; Smoot, NP; Summer, MD; Vapne,  MD [address] [place], [place] [address] [phone] Mon-Fri 8:30-5:00, Sat 10:00-1:00 Providers come to see babies at [facility] Does NOT accept Medicaid Free prenatal information session Tuesdays at 4:45pm [facility] Bouska, MD; Gordon, PA; Jeffery, PA; Weber, PA [address] [place] [address] [phone] Mon-Fri 7:30-5:30 Babies seen by [facility] providers [facility] [address], [place], [place]  [address] [phone]   Fax - [phone]  [place] ([address] & [address]) [facility] [place] [address] [place], [place] [address] [phone] Mon-Thur 8:00-6:00 Providers come to see babies at [facility] Accepting Medicaid [facility] Anderson, NP; Badger, MD; Beal, PA; Spencer, PA [address] [place], [place] [address] [phone] Mon-Thur 7:30-7:30, Fri 7:30-4:30 Babies seen by [facility] providers Accepting Medicaid [facility] Agbuya, MD; Klett, NP; Romgoolam, MD [address], [place], [place] [address] [phone] Mon-Fri 8:30-5:00, Sat 8:30-12:00 Providers come to see babies at [facility] Accepting Medicaid Must have "Meet & Greet" appointment at office prior to delivery [facility] ([facility]) McCord, MD; Wallace, MD; Wood, MD [address], [place], [place] [address] [phone] Mon-Wed 8:00-6:00, Thur-Fri 8:00-5:00, Sat 9:00-12:00 Providers come to see babies at [facility] Does NOT accept Medicaid Only accepting siblings of current patients [facility]  [address], [place], [place]  [address] [phone]   Fax - [phone] [facility] [address], [place], [place]  [address] [phone]   Fax -  [phone]  [place] ([address] & [address]) [facility] Cirigliano, DO; Matthews, DO [address] [place], [place] [address] [phone] Mon-Fri 7:00-5:00 Babies seen by [facility] providers Does NOT accept Medicaid [facility] Briscoe, MD; Howley, PA; Moreira, PA [address], [place], [place] [address] [phone] Mon-Fri 8:00-5:00 Babies seen by [facility] providers Accepting Medicaid [facility] Boyd, MD; Church, PA; Jones, NP; Osborn, PA [address], [place],  [address] [phone] Mon-Fri 8:00-5:00 Babies seen by providers at [facility] Accepting Medicaid  [place] ([address]) [facility] [place] Wendling, DO [address] [place], [place] [address] [phone] Mon-Fri 8:00-5:00 Babies seen by [facility] providers Does NOT accept Medicaid Limited availability, please call early in hospitalization to schedule follow-up [facility] Calderon, PA; Cummings, MD; Dillard, MD; Martin, PA; Olson, MD; VanDeven, PA [address], [place], [place] [address] [phone] Mon-Fri 8:30-5:00, Sat 9:00-12:00 Babies seen by providers at [facility] Accepting Medicaid Please register online then schedule online or call office [email or web address] Hunter, NP; Kumar, MD; Martin Rogers, PA [address], [place], [place] [address] [phone] Mon-Fri 8:00-5:00 Babies seen by providers at [facility] Accepting Medicaid [facility] ([facility]) Roxie, MD; Kristi Fleenor, NP; West, MD [address], [place], [place] [address] [phone] Mon-Fri 8:00-5:30, Sat&Sun by appointment (phones open at  8:30) Babies seen by [facility] providers Accepting Medicaid Must be a first-time baby or sibling of current patient [facility] [place]  [address], [place], [place]  [address] [phone]   Fax - [phone]  [place] ([address]) [facility] Brown, PA; Cowen, PA; Rice, MD; [place], PA; Spry, MD [address] [place], [place] [address] [phone] Mon-Thur 8:00-7:00, Fri 8:00-5:00, Sat 8:00-12:00, Sun 9:00-12:00 Babies seen by [facility] providers Accepting Medicaid [facility] Coe-Goins, MD; Marshall, MD; Pierre-Louis, MD [address], [place], [place] [address] [phone] Mon-Thur 8:00-5:00 Babies seen by providers at [facility] Accepting Medicaid [facility] Bratton, MD; Coe-Goins, MD; Hayes, MD; Lewis, MD; List, MD; Lott, MD; Marshall, MD; Moran, MD; O'Neal, MD; Pierre-Louis, MD; Pitonzo, MD; Scholer, MD; Spangle, MD [address] [place], [place] [address] [phone] Mon-Fri 8:00-5:30, Sat (Oct.-Mar.) 9:00-1:00 Babies seen by providers at [facility] Accepting Medicaid Must fill out new patient packet, available online at [email or web address] [facility] ([facility]) Friddle, NP; Harris, NP; Kelly, NP; Logan, MD; Melvin, PA; Poth, MD; Ramadoss, MD; Stanton, NP [address], [place], [place] [address] [phone] Mon-Thur 8:00-5:30, Fri 8:00-5:00 Babies seen by providers at [facility] Accepting Medicaid  [place] ([address]) [facility] Dixon, PA; Pewaukee, MD; Pickard, MD; Tapia, PA [address], [place], [place] [address] [phone] Mon-Fri 8:00-5:00 Babies seen by providers at [facility] Accepting Medicaid   [place] ([address]) [facility] [place] Masneri, DO; Meyers, MD; Nelson, PA [address], [place], [place] [address] [phone] Mon-Fri 8:00-5:00 Babies seen by providers at [facility] Does NOT accept Medicaid Limited appointment availability, please call early in hospitalization  [facility] [place] Kunedd, DO; McGowen, MD [address], [place], [place] [address] [phone] Mon-Fri 8:00-5:00 Babies seen by [facility] providers Does NOT accept Medicaid [facility] [place] Cameron, MD; MacDonald, MD; Michaels, PA; Nayak, MD [address] [place] [address], [place], [place] [address] [phone] Mon-Fri 8:00-5:00 After hours clinic ([address] [place], [place] [address]) [phone] Mon-Fri 5:00-8:00, Sat 12:00-6:00, Sun 10:00-4:00 Babies seen by [facility] providers Accepting Medicaid [facility] [place] [address], [place], [place]  [address] [phone]   Fax - [phone]  [place] ([address]) [facility] [place] [facility] Andy, MD [address], [place], [place] [address] [phone] Mon-Fri 8:00-5:00 Babies seen by [facility] providers Does NOT accept Medicaid [facility] [place] ([facility] [place]) Eksir, MD [address], [place], [place] [address] [phone] Mon-Thur 8:00-7:00, Fri 8:00-5:00, Sat 8:00-12:00 Babies seen by providers at [facility] Accepting Medicaid - but does not have vaccinations in office (must be received elsewhere) Limited availability, please call early in hospitalization  [place] ([address]) [facility]  Charlene Flemming, MD [address], [place] [address] [phone]  Fax [phone]  [place] [facility]  Kimberly Newton, MD, Annamarie Streilein, PA, Carla Hampton, PA [address] [place], [place]  [address] [phone] [facility]  [address], [place], [place] [address] [phone] [address], [place], [place] [address] [phone] (West Office)  [facility] [address], [place], [place] [address] [phone] [facility] [address], [place], [place] [address] [phone] [facility] [address], [place], [place] [address] [phone] [facility] [address], [place], [place] [address] [phone] [facility] [address], [place], [place] [address] [phone] [facility] [address], [place], [place] [address] [phone] [facility]  [address], [place], [place] [address] [phone] Dr[facility] [address], [place], [place] [address] [phone] [facility] [address], [place], [place] [address] [phone] [facility] [address], , [place] [address] [phone]  

## 2021-05-13 NOTE — Telephone Encounter (Signed)
Transition Care Management Unsuccessful Follow-up Telephone Call  Date of discharge and from where:  05/08/2021-[facility]  Attempts:  3rd Attempt  Reason for unsuccessful TCM follow-up call:  Left voice message

## 2021-05-20 ENCOUNTER — Telehealth (HOSPITAL_COMMUNITY): Payer: Self-pay | Admitting: *Deleted

## 2021-05-20 NOTE — Telephone Encounter (Signed)
Phone voicemail message left to return nurse call.  Duffy Rhody, RN 05-20-2021 at 1:33pm

## 2021-05-25 ENCOUNTER — Telehealth: Payer: Self-pay

## 2021-05-25 ENCOUNTER — Ambulatory Visit (INDEPENDENT_AMBULATORY_CARE_PROVIDER_SITE_OTHER): Payer: Medicaid Other | Admitting: Cardiology

## 2021-05-25 ENCOUNTER — Encounter: Payer: Self-pay | Admitting: Cardiology

## 2021-05-25 ENCOUNTER — Other Ambulatory Visit: Payer: Self-pay

## 2021-05-25 ENCOUNTER — Ambulatory Visit (INDEPENDENT_AMBULATORY_CARE_PROVIDER_SITE_OTHER): Payer: Medicaid Other

## 2021-05-25 VITALS — BP 122/71 | HR 72 | Ht 65.0 in | Wt 193.8 lb

## 2021-05-25 DIAGNOSIS — R0602 Shortness of breath: Secondary | ICD-10-CM

## 2021-05-25 DIAGNOSIS — Z95810 Presence of automatic (implantable) cardiac defibrillator: Secondary | ICD-10-CM | POA: Diagnosis not present

## 2021-05-25 DIAGNOSIS — Z79899 Other long term (current) drug therapy: Secondary | ICD-10-CM | POA: Diagnosis not present

## 2021-05-25 DIAGNOSIS — I421 Obstructive hypertrophic cardiomyopathy: Secondary | ICD-10-CM

## 2021-05-25 LAB — CUP PACEART REMOTE DEVICE CHECK
Battery Remaining Longevity: 113 mo
Battery Voltage: 3.01 V
Brady Statistic RV Percent Paced: 0.19 %
Date Time Interrogation Session: 20230123001315
HighPow Impedance: 45 Ohm
Implantable Lead Implant Date: 20111229
Implantable Lead Location: 753860
Implantable Lead Model: 7122
Implantable Pulse Generator Implant Date: 20200108
Lead Channel Impedance Value: 665 Ohm
Lead Channel Impedance Value: 779 Ohm
Lead Channel Pacing Threshold Amplitude: 2.5 V
Lead Channel Pacing Threshold Pulse Width: 0.4 ms
Lead Channel Sensing Intrinsic Amplitude: 11.75 mV
Lead Channel Sensing Intrinsic Amplitude: 11.75 mV
Lead Channel Setting Pacing Amplitude: 3.5 V
Lead Channel Setting Pacing Pulse Width: 1 ms
Lead Channel Setting Sensing Sensitivity: 0.6 mV

## 2021-05-25 MED ORDER — METOPROLOL SUCCINATE ER 25 MG PO TB24: 12.5000 mg | ORAL_TABLET | Freq: Every day | ORAL | 3 refills | Status: DC

## 2021-05-25 MED ORDER — FUROSEMIDE 20 MG PO TABS: ORAL_TABLET | ORAL | 3 refills | Status: DC

## 2021-05-25 NOTE — Telephone Encounter (Signed)
Called pt per Dr. Harriet Masson. Pt to start Lasix 40 mg for 3 days then 20 mg once daily. Echo ordered. Pt knows to stop beta blocker She verbalized understanding, no further questions at this time.

## 2021-05-25 NOTE — Addendum Note (Signed)
Addended by: Reynolds Bowl on: 05/25/2021 06:02 PM   Modules accepted: Orders

## 2021-05-25 NOTE — Telephone Encounter (Signed)
Pt is returning call.  

## 2021-05-25 NOTE — Telephone Encounter (Signed)
Spoke with patient informed her that I had reviewed with information with Dr. Caryl Comes who has contacted Dr. Harriet Masson and she should expect to hear from Dr Quintella Reichert office tomorrow regarding an urgent echocardiogram patient voiced understanding

## 2021-05-25 NOTE — Progress Notes (Addendum)
Cardio-Obstetrics Clinic  Follow Up Note   Date:  05/25/2021   ID:  Sara Lopez, DOB 02-Dec-1995, MRN [id number]  PCP:  Sara SpecterGomez, Roger David, PA-C   [facility] Providers Cardiologist:  Sara RippleKardie Ellarose Brandi, DO  Electrophysiologist:  Sara MangesSteven Klein, MD        Referring MD: Sara SpecterGomez, Roger David, PA-C   Chief Complaint: " I am short of breath"  History of Present Illness:    Sara Lopez is a 26 y.o. female [G1P1001] who returns for a postpartum follow-up.  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   I saw the patient on March 17, 2021 at that time she is doing well from a cardiovascular standpoint.  She was in her third trimester pregnancy.   During her visit she was asymptomatic no shortness of breath, no chest pain no lightheadedness no dizziness.  We talked about her delivery plans and all of her questions were answered at that time.    I scheduled the patient for an echocardiogram.  She was able to get her echocardiogram with did not show any resting gradients.  She safely delivered her Sara on May 06, 2021.  She is almost 3 weeks postpartum.  She tells me that recently she has been noticing that she is experiencing chest tightness as well as shortness of breath.  She has had some palpitations but not much.  The shortness of breath is the most bothersome symptoms.  She has not had any lightheadedness or dizziness.  No other complaints at this time.  In the office today with Sara Lopez.  Prior CV Studies Reviewed: The following studies were reviewed today:  TTE 04/03/2021 IMPRESSIONS   1. Left ventricular ejection fraction, by  estimation, is 65 to 70%. The left ventricle has normal function. The left ventricle has no regional wall motion abnormalities. There is severe asymmetric left ventricular hypertrophy of the septal segment (18-19 mm at mid ventricular septum). LVOT flow acceleration with maximum instantaneous gradient of 22 mmHg, with systolic anterior motion of mitral valve. Morphology may be best described as reverse curve HCM. Left ventricular diastolic parameters are  consistent with Grade II diastolic dysfunction (pseudonormalization).   2. Right ventricular systolic function is mildly reduced. The right  ventricular size is normal. Tricuspid regurgitation signal is inadequate  for assessing PA pressure.   3. Left atrial size was severely dilated.   4. A small pericardial effusion is present. The pericardial effusion is  circumferential. There is no evidence of cardiac tamponade.   5. Systolic anterior motion of the mitral valve.. The mitral valve is  grossly normal. Mild-moderate and posteriorly directed mitral valve  regurgitation. No evidence of mitral stenosis.   6. The aortic valve is grossly normal. Aortic valve regurgitation is  trivial. No aortic stenosis is present.   7. The inferior vena cava is dilated in size with >50% respiratory  variability, suggesting right atrial pressure of 8 mmHg.   FINDINGS   Left Ventricle: Left ventricular ejection fraction, by estimation, is 65  to 70%. The left ventricle has normal function. The left ventricle has no  regional wall motion abnormalities. The left ventricular internal cavity  size was normal in size.  There is   severe asymmetric left ventricular hypertrophy of the septal segment.  Left ventricular diastolic parameters are consistent with Grade II  diastolic dysfunction (pseudonormalization).   Right Ventricle: The right ventricular size is normal. No increase in  right ventricular wall thickness. Right ventricular systolic function is  mildly  reduced. Tricuspid regurgitation signal is inadequate for assessing  PA pressure.   Left Atrium: Left atrial size was severely dilated.   Right Atrium: Right atrial size was normal in size.   Pericardium: A small pericardial effusion is present. The pericardial  effusion is circumferential. There is no evidence of cardiac tamponade.   Mitral Valve: Systolic anterior motion of the mitral valve. The mitral  valve is grossly normal. Mild-moderate and posteriorly directed mitral  valve regurgitation. No evidence of mitral valve stenosis.   Tricuspid Valve: The tricuspid valve is normal in structure. Tricuspid  valve regurgitation is trivial.   Aortic Valve: The aortic valve is grossly normal. Aortic valve  regurgitation is trivial. No aortic stenosis is present.   Pulmonic Valve: The pulmonic valve was not well visualized. Pulmonic valve  regurgitation is trivial.   Aorta: The aortic root is normal in size and structure.   Venous: The inferior vena cava is dilated in size with greater than 50%  respiratory variability, suggesting right atrial pressure of 8 mmHg.   IAS/Shunts: No atrial level shunt detected by color flow Doppler.   Additional Comments: A device lead is visualized in the right atrium and  right ventricle.    Past Medical History:  Diagnosis Date   ADHD    Coronary-myocardial bridge    LHC at [facility] 2/14: LAD mid myocardial bridge (patent in diastole and near complete occlusion distally) // Myoview 2/14: Inferior attenuation, no ischemia, normal EF   Depression    Development delay    History of echocardiogram 08/2016   Echo 5/18: severe asymmetric septal hypertrophy, EF 60-65, dynamic obstruction at rest, peak LVOT 328 cm/sec and peak gradient 43 mmHg, no RWMA, septal thickness 24.6 mm, post wall thickness 15.5 mm, +SAM, mild MR, severe LAE, PASP 30   History of echocardiogram 1.2014 at [facility]   Echo 1/14: Hypertrophic CM, mild dynamic LVOT - peak 27 mmHg, chordal  SAM with LVOT obstruction, mild MR, dilate LCA, abnormal LV diastolic function, hyperdynamic LV systolic function    HOCM (hypertrophic obstructive cardiomyopathy) (HCC)    ICD (implantable cardioverter-defibrillator) in place    Medtronic model D334DRM (Protect), serial number [id number], implanted 04/30/2010    Myocardial infarction (HCC)    hx of elevated Tn levels // LHC in 2014 at [facility] with myocardial bridging   VF (ventricular fibrillation) (HCC)    admitted in 2011 with VF arrest >> dx with HOCM // s/p ICD    Past Surgical History:  Procedure Laterality Date   ICD GENERATOR CHANGEOUT N/A 05/10/2018   Procedure: ICD GENERATOR CHANGEOUT;  Surgeon: Duke Salvia, MD;  Location: [facility] INVASIVE CV LAB;  Service: Cardiovascular;  Laterality: N/A;   ICD IMPLANT  2011      OB History     Gravida  1   Para  1   Term  1   Preterm      AB      Living  1      SAB      IAB      Ectopic      Multiple  0   Live Births  1  Current Medications: Current Meds  Medication Sig   acetaminophen (TYLENOL) 325 MG tablet Take 2 tablets (650 mg total) by mouth every 4 (four) hours as needed (for pain scale < 4).   furosemide (LASIX) 20 MG tablet Take 40 mg for 3 days then 20 mg daily.   ibuprofen (ADVIL) 600 MG tablet Take 1 tablet (600 mg total) by mouth every 6 (six) hours.   Prenatal Vit-Fe Fumarate-FA (MULTIVITAMIN-PRENATAL) 27-0.8 MG TABS tablet Take 1 tablet by mouth daily at 12 noon.   [DISCONTINUED] metoprolol succinate (TOPROL XL) 25 MG 24 hr tablet Take 0.5 tablets (12.5 mg total) by mouth daily.     Allergies:   Patient has no known allergies.   Social History   Socioeconomic History   Marital status: Single    Spouse name: Not on file   Number of children: Not on file   Years of education: Not on file   Highest education level: Not on file  Occupational History   Occupation: Conservation officer, naturecashier  Tobacco Use   Smoking status: Former    Types: Cigarettes     Quit date: 07/01/2020    Years since quitting: 0.8   Smokeless tobacco: Former  Building services engineerVaping Use   Vaping Use: Former   Substances: Nicotine   Devices: not since preg 06/2020  Substance and Sexual Activity   Alcohol use: Not Currently    Alcohol/week: 1.0 standard drink    Types: 1 Glasses of wine per week    Comment: not since preg   Drug use: No    Types: Marijuana    Comment: no drugs since 2011   Sexual activity: Yes    Birth control/protection: None  Other Topics Concern   Not on file  Social History Narrative   Trying to get a job with [employer]    Currently working as Conservation officer, naturecashier at [employer]   Single    No kids   Social Determinants of Corporate investment bankerHealth   Financial Resource Strain: Not on file  Food Insecurity: No Food Insecurity   Worried About Programme researcher, broadcasting/film/videounning Out of Food in the Last Year: Never true   Baristaan Out of Food in the Last Year: Never true  Transportation Needs: No Transportation Needs   Lack of Transportation (Medical): No   Lack of Transportation (Non-Medical): No  Physical Activity: Not on file  Stress: Not on file  Social Connections: Not on file      Family History  Adopted: Yes      ROS:   Review of Systems  Constitution: Negative for decreased appetite, fever and weight gain.  HENT: Negative for congestion, ear discharge, hoarse voice and sore throat.   Eyes: Negative for discharge, redness, vision loss in right eye and visual halos.  Cardiovascular: Reports chest pain, dyspnea on exertion, and palpitation.  Negative for leg swelling, orthopnea. Respiratory: Negative for cough, hemoptysis, shortness of breath and snoring.   Endocrine: Negative for heat intolerance and polyphagia.  Hematologic/Lymphatic: Negative for bleeding problem. Does not bruise/bleed easily.  Skin: Negative for flushing, nail changes, rash and suspicious lesions.  Musculoskeletal: Negative for arthritis, joint pain, muscle cramps, myalgias, neck pain and stiffness.   Gastrointestinal: Negative for abdominal pain, bowel incontinence, diarrhea and excessive appetite.  Genitourinary: Negative for decreased libido, genital sores and incomplete emptying.  Neurological: Negative for brief paralysis, focal weakness, headaches and loss of balance.  Psychiatric/Behavioral: Negative for altered mental status, depression and suicidal ideas.  Allergic/Immunologic: Negative for HIV exposure and persistent infections.  Labs/EKG Reviewed:    EKG:   EKG is was not ordered today.    Recent Labs: 05/05/2021: ALT 12 05/25/2021: BUN 7; Creatinine, Ser 0.72; Hemoglobin WILL FOLLOW; Magnesium 1.7; Platelets WILL FOLLOW; Potassium 3.6; Sodium 139   Recent Lipid Panel No results found for: CHOL, TRIG, HDL, CHOLHDL, LDLCALC, LDLDIRECT  Physical Exam:    VS:  BP 122/71    Pulse 72    Ht 5\' 5"  (1.651 m)    Wt 193 lb 12.8 oz (87.9 kg)    LMP 07/30/2020 (Exact Date)    SpO2 92%    BMI 32.25 kg/m     Wt Readings from Last 3 Encounters:  05/25/21 193 lb 12.8 oz (87.9 kg)  05/12/21 205 lb 8 oz (93.2 kg)  05/05/21 206 lb (93.4 kg)     GEN:  Well nourished, well developed in no acute distress HEENT: Normal NECK: No JVD; No carotid bruits LYMPHATICS: No lymphadenopathy CARDIAC: RRR, no murmurs, rubs, gallops RESPIRATORY:  Clear to auscultation without rales, wheezing or rhonchi  ABDOMEN: Soft, non-tender, non-distended MUSCULOSKELETAL:  No edema; No deformity  SKIN: Warm and dry NEUROLOGIC:  Alert and oriented x 3 PSYCHIATRIC:  Normal affect    Risk Assessment/Risk Calculators:                 ASSESSMENT & PLAN:    Shortness of breath Chest discomfort Hypertrophic cardiomyopathy Postpartum visit   Over the last 2 weeks she has been experiencing significant shortness of breath, chest discomfort.  This is all new postpartum.  I  initially suspected that her symptoms are related to her hypertrophic cardiomyopathy.  And planned to start low-dose Toprol-XL  12.5 mg daily but the patient but later today after her device interrogation there is evidence of some fluid overload. So will hold the beta blocker for now and start diuretics. Lasix 40 mg for the next 2 days and 20 mg daily thereafter.   Plan for an urgent echo for this week. I will see the patient in 2 week.  Plan to get mychart update in 1 week.   She will require close follow up.   We will get blood work to assess for any electrolyte abnormalities as well as her CBC.    Patient Instructions  Medication Instructions:  Your physician has recommended you make the following change in your medication:  START: Metoprolol succinate 12.5 mg once daily *If you need a refill on your cardiac medications before your next appointment, please call your pharmacy*   Lab Work: Your physician recommends that you return for lab work in:  TODAY: BMET, Mag, CBC If you have labs (blood work) drawn today and your tests are completely normal, you will receive your results only by: MyChart Message (if you have MyChart) OR A paper copy in the mail If you have any lab test that is abnormal or we need to change your treatment, we will call you to review the results.   Testing/Procedures: None   Follow-Up: At [facility], you and your health needs are our priority.  As part of our continuing mission to provide you with exceptional heart care, we have created designated Provider Care Teams.  These Care Teams include your primary Cardiologist (physician) and Advanced Practice Providers (APPs -  Physician Assistants and Nurse Practitioners) who all work together to provide you with the care you need, when you need it.  We recommend signing up for the patient portal called "MyChart".  Sign up information is  provided on this After Visit Summary.  MyChart is used to connect with patients for Virtual Visits (Telemedicine).  Patients are able to view lab/test results, encounter notes, upcoming appointments,  etc.  Non-urgent messages can be sent to your provider as well.   To learn more about what you can do with MyChart, go to [email or web address].    Your next appointment:   8 week(s)  The format for your next appointment:   Virtual Visit   Provider:   Thomasene Ripple, DO     Other Instructions -Please send a MyChart message in 2 weeks with updates of your symptoms.  -Please increase fluid intake, limit salt containing foods and beverages.    Dispo:  Return in about 8 weeks (around 07/20/2021).   Medication Adjustments/Labs and Tests Ordered: Current medicines are reviewed at length with the patient today.  Concerns regarding medicines are outlined above.  Tests Ordered: Orders Placed This Encounter  Procedures   Basic Metabolic Panel (BMET)   Magnesium   CBC with Differential/Platelet   ECHOCARDIOGRAM COMPLETE   Medication Changes: Meds ordered this encounter  Medications   DISCONTD: metoprolol succinate (TOPROL XL) 25 MG 24 hr tablet    Sig: Take 0.5 tablets (12.5 mg total) by mouth daily.    Dispense:  45 tablet    Refill:  3   furosemide (LASIX) 20 MG tablet    Sig: Take 40 mg for 3 days then 20 mg daily.    Dispense:  90 tablet    Refill:  3

## 2021-05-25 NOTE — Telephone Encounter (Signed)
Alert from MDT ICD: HF alert possible optivol fluid accumulation ongoing since 05/12/2021, patient is 3 weeks postpartum with complaints of "SOB" with office visit Dr. Harriet Masson on 05/25/21

## 2021-05-25 NOTE — Telephone Encounter (Signed)
Spoke with patient informed her that I had not received a recommendation for Dr. Servando Salina, asked her to call her obgyn as she also feels like she is waking up at night SOB, informed her that I would review this with Dr. Graciela Husbands and call back if he had any recommendations. Patient voiced understanding.

## 2021-05-25 NOTE — Patient Instructions (Addendum)
Medication Instructions:  Your physician has recommended you make the following change in your medication:  START: Metoprolol succinate 12.5 mg once daily *If you need a refill on your cardiac medications before your next appointment, please call your pharmacy*   Lab Work: Your physician recommends that you return for lab work in:  TODAY: BMET, Mag, CBC If you have labs (blood work) drawn today and your tests are completely normal, you will receive your results only by: MyChart Message (if you have MyChart) OR A paper copy in the mail If you have any lab test that is abnormal or we need to change your treatment, we will call you to review the results.   Testing/Procedures: None   Follow-Up: At [facility], you and your health needs are our priority.  As part of our continuing mission to provide you with exceptional heart care, we have created designated Provider Care Teams.  These Care Teams include your primary Cardiologist (physician) and Advanced Practice Providers (APPs -  Physician Assistants and Nurse Practitioners) who all work together to provide you with the care you need, when you need it.  We recommend signing up for the patient portal called "MyChart".  Sign up information is provided on this After Visit Summary.  MyChart is used to connect with patients for Virtual Visits (Telemedicine).  Patients are able to view lab/test results, encounter notes, upcoming appointments, etc.  Non-urgent messages can be sent to your provider as well.   To learn more about what you can do with MyChart, go to [email or web address].    Your next appointment:   8 week(s)  The format for your next appointment:   Virtual Visit   Provider:   Thomasene Ripple, DO     Other Instructions -Please send a MyChart message in 2 weeks with updates of your symptoms.  -Please increase fluid intake, limit salt containing foods and beverages.

## 2021-05-25 NOTE — Telephone Encounter (Signed)
LVM for patient to call device clinic back regarding optivol, patient need to contact OB as she is 3 weeks postpartum.

## 2021-05-26 ENCOUNTER — Ambulatory Visit (HOSPITAL_COMMUNITY): Payer: Medicaid Other | Attending: Cardiology

## 2021-05-26 DIAGNOSIS — R0602 Shortness of breath: Secondary | ICD-10-CM

## 2021-05-26 LAB — CBC WITH DIFFERENTIAL/PLATELET
Basophils Absolute: 0.1 10*3/uL (ref 0.0–0.2)
Basos: 1 %
EOS (ABSOLUTE): 0.1 10*3/uL (ref 0.0–0.4)
Eos: 1 %
Hematocrit: 30.9 % — ABNORMAL LOW (ref 34.0–46.6)
Hemoglobin: 10.1 g/dL — ABNORMAL LOW (ref 11.1–15.9)
Immature Grans (Abs): 0 10*3/uL (ref 0.0–0.1)
Immature Granulocytes: 0 %
Lymphocytes Absolute: 1.2 10*3/uL (ref 0.7–3.1)
Lymphs: 22 %
MCH: 28.9 pg (ref 26.6–33.0)
MCHC: 32.7 g/dL (ref 31.5–35.7)
MCV: 88 fL (ref 79–97)
Monocytes Absolute: 0.4 10*3/uL (ref 0.1–0.9)
Monocytes: 7 %
Neutrophils Absolute: 3.6 10*3/uL (ref 1.4–7.0)
Neutrophils: 69 %
Platelets: 363 10*3/uL (ref 150–450)
RBC: 3.5 x10E6/uL — ABNORMAL LOW (ref 3.77–5.28)
RDW: 13.4 % (ref 11.7–15.4)
WBC: 5.2 10*3/uL (ref 3.4–10.8)

## 2021-05-26 LAB — MAGNESIUM: Magnesium: 1.7 mg/dL (ref 1.6–2.3)

## 2021-05-26 LAB — BASIC METABOLIC PANEL
BUN/Creatinine Ratio: 10 (ref 9–23)
BUN: 7 mg/dL (ref 6–20)
CO2: 22 mmol/L (ref 20–29)
Calcium: 8.4 mg/dL — ABNORMAL LOW (ref 8.7–10.2)
Chloride: 105 mmol/L (ref 96–106)
Creatinine, Ser: 0.72 mg/dL (ref 0.57–1.00)
Glucose: 88 mg/dL (ref 70–99)
Potassium: 3.6 mmol/L (ref 3.5–5.2)
Sodium: 139 mmol/L (ref 134–144)
eGFR: 118 mL/min/{1.73_m2} (ref >59)

## 2021-05-26 LAB — ECHOCARDIOGRAM COMPLETE
Area-P 1/2: 4.12 cm2
P 1/2 time: 573 msec
S' Lateral: 3.1 cm

## 2021-05-27 ENCOUNTER — Telehealth: Payer: Self-pay

## 2021-05-27 ENCOUNTER — Other Ambulatory Visit: Payer: Self-pay

## 2021-05-27 MED ORDER — METOPROLOL SUCCINATE ER 25 MG PO TB24: 12.5000 mg | ORAL_TABLET | Freq: Every day | ORAL | 3 refills | Status: DC

## 2021-05-27 NOTE — Progress Notes (Signed)
Prescription sent to pharmacy per Dr. Servando Salina.

## 2021-05-27 NOTE — Telephone Encounter (Signed)
Called pt to check on her. No answer. Left message for her to return the call.

## 2021-06-03 ENCOUNTER — Encounter: Payer: Self-pay | Admitting: Obstetrics and Gynecology

## 2021-06-03 ENCOUNTER — Ambulatory Visit (INDEPENDENT_AMBULATORY_CARE_PROVIDER_SITE_OTHER): Payer: Medicaid Other | Admitting: Obstetrics and Gynecology

## 2021-06-03 ENCOUNTER — Other Ambulatory Visit: Payer: Self-pay

## 2021-06-03 MED ORDER — NORETHINDRONE 0.35 MG PO TABS: 1.0000 | ORAL_TABLET | Freq: Every day | ORAL | 3 refills | Status: DC

## 2021-06-03 NOTE — Progress Notes (Signed)
Post Partum Visit Note  Sara Lopez is a 26 y.o. G69P1001 female who presents for a postpartum visit. She is 4 weeks postpartum following a normal spontaneous vaginal delivery.  I have fully reviewed the prenatal and intrapartum course. The delivery was at 39/1 gestational weeks.  Anesthesia: epidural. Postpartum course has been going well besides some fluid overload type symptoms which have resolved after lasix with cardiologist (Dr. Servando Salina). Baby is doing well. Baby is feeding by both breast and bottle - Carnation Good Start. Bleeding thin lochia. Bowel function is normal. Bladder function is normal. Patient is not sexually active. Contraception method is none. Postpartum depression screening: negative.   The pregnancy intention screening data noted above was reviewed. Potential methods of contraception were discussed. The patient elected to proceed with POP.    Edinburgh Postnatal Depression Scale - 06/03/21 1542       Edinburgh Postnatal Depression Scale:  In the Past 7 Days   I have been able to laugh and see the funny side of things. 0    I have looked forward with enjoyment to things. 0    I have blamed myself unnecessarily when things went wrong. 0    I have been anxious or worried for no good reason. 0    I have felt scared or panicky for no good reason. 0    Things have been getting on top of me. 0    I have been so unhappy that I have had difficulty sleeping. 0    I have felt sad or miserable. 0    I have been so unhappy that I have been crying. 0             Health Maintenance Due  Topic Date Due   COVID-19 Vaccine (1) Never done   INFLUENZA VACCINE  Never done    The following portions of the patient's history were reviewed and updated as appropriate: allergies, current medications, past family history, past medical history, past social history, past surgical history, and problem list.  Review of Systems Pertinent items are noted in HPI.  Objective:  BP  118/87    Pulse 67    Ht 5\' 5"  (1.651 m)    Wt 183 lb 8 oz (83.2 kg)    LMP 07/30/2020 (Exact Date)    BMI 30.54 kg/m    General:  alert, cooperative, and no distress   Breasts:  not indicated  Lungs: clear to auscultation bilaterally  Heart:  regular rate and rhythm  Abdomen: soft, non-tender; bowel sounds normal; no masses,  no organomegaly   Wound N/a  GU exam:  not indicated       Assessment:    1. Postpartum care and examination 4 wk postpartum  Plan:   Essential components of care per ACOG recommendations:  1.  Mood and well being: Patient with negative depression screening today. Reviewed local resources for support.  - Patient tobacco use? No.   - hx of drug use? No.    2. Infant care and feeding:  -Patient currently breastmilk feeding? Yes. Reviewed importance of draining breast regularly to support lactation.  -Social determinants of health (SDOH) reviewed in EPIC. No concerns  3. Sexuality, contraception and birth spacing - Patient does not want a pregnancy in the next year.  Desired family size is 1 children.  - Reviewed forms of contraception in tiered fashion. Patient desired oral progesterone-only contraceptive today.   - Discussed birth spacing of 18 months  4. Sleep and fatigue -Encouraged family/partner/community support of 4 hrs of uninterrupted sleep to help with mood and fatigue  5. Physical Recovery  - Discussed patients delivery and complications. She describes her labor as mixed. Overall she had a good experience but it was more impacted by concerns around her cardiac history.  - Patient had a  VAVD . Patient had a  vaginal side wall  laceration. healing reviewed. Patient expressed understanding - Patient has urinary incontinence? No. - Patient is safe to resume physical and sexual activity  6.  Health Maintenance - HM due items addressed Yes - Last pap smear  Diagnosis  Date Value Ref Range Status  11/07/2020   Final   - Negative for  intraepithelial lesion or malignancy (NILM)   Pap smear not done at today's visit.  -Breast Cancer screening indicated? No.   7. Chronic Disease/Pregnancy Condition follow up:  Continued f/u with cardiology and EP due to cardiac history  Milas Hock, MD [facility]

## 2021-06-03 NOTE — Progress Notes (Signed)
Pt wants Birth Control Pills 

## 2021-06-03 NOTE — Progress Notes (Signed)
Heart Problems after delivery, saw Cardiologist.

## 2021-06-04 NOTE — Progress Notes (Signed)
Remote ICD transmission.   

## 2021-06-08 ENCOUNTER — Encounter: Payer: Self-pay | Admitting: Cardiology

## 2021-06-08 ENCOUNTER — Other Ambulatory Visit: Payer: Self-pay

## 2021-06-08 ENCOUNTER — Ambulatory Visit (INDEPENDENT_AMBULATORY_CARE_PROVIDER_SITE_OTHER): Payer: Medicaid Other | Admitting: Cardiology

## 2021-06-08 VITALS — BP 106/82 | HR 71 | Ht 65.0 in | Wt 181.6 lb

## 2021-06-08 DIAGNOSIS — O99815 Abnormal glucose complicating the puerperium: Secondary | ICD-10-CM

## 2021-06-08 DIAGNOSIS — I421 Obstructive hypertrophic cardiomyopathy: Secondary | ICD-10-CM | POA: Diagnosis not present

## 2021-06-08 NOTE — Patient Instructions (Signed)
Medication Instructions:  Your physician has recommended you make the following change in your medication:  STOP: Lasix *If you need a refill on your cardiac medications before your next appointment, please call your pharmacy*   Lab Work: None If you have labs (blood work) drawn today and your tests are completely normal, you will receive your results only by: MyChart Message (if you have MyChart) OR A paper copy in the mail If you have any lab test that is abnormal or we need to change your treatment, we will call you to review the results.   Testing/Procedures: None   Follow-Up: At [facility], you and your health needs are our priority.  As part of our continuing mission to provide you with exceptional heart care, we have created designated Provider Care Teams.  These Care Teams include your primary Cardiologist (physician) and Advanced Practice Providers (APPs -  Physician Assistants and Nurse Practitioners) who all work together to provide you with the care you need, when you need it.  We recommend signing up for the patient portal called "MyChart".  Sign up information is provided on this After Visit Summary.  MyChart is used to connect with patients for Virtual Visits (Telemedicine).  Patients are able to view lab/test results, encounter notes, upcoming appointments, etc.  Non-urgent messages can be sent to your provider as well.   To learn more about what you can do with MyChart, go to [email or web address].    Your next appointment:   12 week(s)  The format for your next appointment:   Virtual Visit   Provider:   Thomasene Ripple, DO     Other Instructions

## 2021-06-08 NOTE — Progress Notes (Signed)
Cardio-Obstetrics Clinic  Follow Up Note   Date:  06/09/2021   ID:  Sara Lopez, DOB 05-Jun-1995, MRN [id number]  PCP:  Loletta Specter, PA-C   [facility] Providers Cardiologist:  Thomasene Ripple, DO  Electrophysiologist:  Sherryl Manges, MD        Referring MD: Loletta Specter, PA-C   Chief Complaint: " I am doing a lot better- no more shortness of breath"  History of Present Illness:    Sara Lopez is a 26 y.o. female [G1P1001] who returns for follow up of history of hypertrophic cardiomyopathy diagnosed as a teenager is now status post Medtronic ICD due to ventricular arrhythmia for secondary prevention with initial ICD placement in 2011 and later change in 2020 she has rerelease follow-up with Dr. 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   I saw the patient on March 17, 2021 at that time she is doing well from a cardiovascular standpoint.  She was in her third trimester pregnancy.    During her visit she was asymptomatic no shortness of breath, no chest pain no lightheadedness no dizziness.  We talked about her delivery plans and all of her questions were answered at that time.     I scheduled the patient for an echocardiogram.  She was able to get her echocardiogram with did not show any resting gradients.  She safely delivered her baby on May 06, 2021.  I saw the patient on 05/25/2021 at that time she was experiencing significant shortness of breath and there was some report on her device interrogation showing fluid overload so I started the patient on diuretics for short while.  And then transition to add a beta-blocker as well.  Today she tells me that she is doing well.  She is very happy that shortness of breath has improved significantly.  No complaints today.    Prior CV Studies Reviewed: The following studies were reviewed today:   Past Medical  History:  Diagnosis Date   ADHD    Coronary-myocardial bridge    LHC at [facility] 2/14: LAD mid myocardial bridge (patent in diastole and near complete occlusion distally) // Myoview 2/14: Inferior attenuation, no ischemia, normal EF   Depression    Development delay    History of echocardiogram 08/2016   Echo 5/18: severe asymmetric septal hypertrophy, EF 60-65, dynamic obstruction at rest, peak LVOT 328 cm/sec and peak gradient 43 mmHg, no RWMA, septal thickness 24.6 mm, post wall thickness 15.5 mm, +SAM, mild MR, severe LAE, PASP 30   History of echocardiogram 1.2014 at [facility]   Echo 1/14: Hypertrophic CM, mild dynamic LVOT - peak 27 mmHg, chordal SAM with LVOT obstruction, mild MR, dilate LCA, abnormal LV diastolic function, hyperdynamic LV systolic function    HOCM (hypertrophic obstructive cardiomyopathy) (HCC)    ICD (implantable cardioverter-defibrillator) in place    Medtronic model [id number] (Protect), serial number [id number], implanted 04/30/2010    Myocardial infarction (HCC)    hx of elevated Tn levels // LHC in 2014 at [facility] with myocardial bridging   VF (ventricular fibrillation) (HCC)    admitted in 2011 with VF arrest >> dx with HOCM // s/p ICD    Past Surgical History:  Procedure Laterality Date   ICD GENERATOR CHANGEOUT N/A 05/10/2018   Procedure: ICD GENERATOR CHANGEOUT;  Surgeon: Duke Salvia, MD;  Location: [facility] INVASIVE CV LAB;  Service: Cardiovascular;  Laterality: N/A;  ICD IMPLANT  2011      OB History     Gravida  1   Para  1   Term  1   Preterm      AB      Living  1      SAB      IAB      Ectopic      Multiple  0   Live Births  1               Current Medications: Current Meds  Medication Sig   acetaminophen (TYLENOL) 325 MG tablet Take 2 tablets (650 mg total) by mouth every 4 (four) hours as needed (for pain scale < 4).   ibuprofen (ADVIL) 600 MG tablet Take 1 tablet (600 mg total) by mouth every 6 (six) hours.   metoprolol  succinate (TOPROL-XL) 25 MG 24 hr tablet Take 0.5 tablets (12.5 mg total) by mouth daily.   norethindrone (MICRONOR) 0.35 MG tablet Take 1 tablet (0.35 mg total) by mouth daily.   Prenatal Vit-Fe Fumarate-FA (MULTIVITAMIN-PRENATAL) 27-0.8 MG TABS tablet Take 1 tablet by mouth daily at 12 noon.   [DISCONTINUED] furosemide (LASIX) 20 MG tablet Take 40 mg for 3 days then 20 mg daily.     Allergies:   Patient has no known allergies.   Social History   Socioeconomic History   Marital status: Single    Spouse name: Not on file   Number of children: Not on file   Years of education: Not on file   Highest education level: Not on file  Occupational History   Occupation: Conservation officer, nature  Tobacco Use   Smoking status: Former    Types: Cigarettes    Quit date: 07/01/2020    Years since quitting: 0.9   Smokeless tobacco: Former  Building services engineer Use: Former   Substances: Nicotine   Devices: not since preg 06/2020  Substance and Sexual Activity   Alcohol use: Not Currently    Alcohol/week: 1.0 standard drink    Types: 1 Glasses of wine per week    Comment: not since preg   Drug use: No    Types: Marijuana    Comment: no drugs since 2011   Sexual activity: Yes    Birth control/protection: None  Other Topics Concern   Not on file  Social History Narrative   Trying to get a job with [employer]    Currently working as Conservation officer, 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     All other systems reviewed and are negative.   Labs/EKG Reviewed:    EKG:   EKG is was  ordered today.   Recent Labs: 05/05/2021: ALT 12 05/25/2021: BUN 7; Creatinine, Ser 0.72; Hemoglobin 10.1; Magnesium 1.7; Platelets 363; Potassium 3.6; Sodium 139   Recent Lipid  Panel No results found for: CHOL, TRIG, HDL, CHOLHDL, LDLCALC, LDLDIRECT  Physical Exam:    VS:  BP 106/82    Pulse 71    Ht 5\' 5"  (1.651 m)    Wt 181 lb 9.6 oz (82.4 kg)    LMP 07/30/2020 (Exact Date)    SpO2 98%    Breastfeeding Yes    BMI 30.22 kg/m     Wt Readings from Last 3 Encounters:  06/08/21 181 lb 9.6 oz (82.4 kg)  06/03/21 183 lb 8 oz (83.2 kg)  05/25/21 193 lb 12.8 oz (87.9 kg)     GEN:  Well nourished, well developed in no acute distress HEENT: Normal NECK: No JVD; No carotid bruits LYMPHATICS: No lymphadenopathy CARDIAC: RRR, no murmurs, rubs, gallops RESPIRATORY:  Clear to auscultation without rales, wheezing or rhonchi  ABDOMEN: Soft, non-tender, non-distended MUSCULOSKELETAL:  No edema; No deformity  SKIN: Warm and dry NEUROLOGIC:  Alert and oriented x 3 PSYCHIATRIC:  Normal affect    Risk Assessment/Risk Calculators:                 ASSESSMENT & PLAN:    Hypertrophic cardiomyopathy Postpartum visit  Clinically she appears to be doing well from a cardiovascular standpoint.  I am going to stop the Lasix today.  We will continue her beta-blocker. She is hoping to be able to breast-feed.  Follow-up in 12 weeks virtually.  Patient Instructions  Medication Instructions:  Your physician has recommended you make the following change in your medication:  STOP: Lasix *If you need a refill on your cardiac medications before your next appointment, please call your pharmacy*   Lab Work: None If you have labs (blood work) drawn today and your tests are completely normal, you will receive your results only by: MyChart Message (if you have MyChart) OR A  paper copy in the mail If you have any lab test that is abnormal or we need to change your treatment, we will call you to review the results.   Testing/Procedures: None   Follow-Up: At [facility], you and your health needs are our priority.  As part of our continuing mission to provide you with exceptional heart care, we have created designated Provider Care Teams.  These Care Teams include your primary Cardiologist (physician) and Advanced Practice Providers (APPs -  Physician Assistants and Nurse Practitioners) who all work together to provide you with the care you need, when you need it.  We recommend signing up for the patient portal called "MyChart".  Sign up information is provided on this After Visit Summary.  MyChart is used to connect with patients for Virtual Visits (Telemedicine).  Patients are able to view lab/test results, encounter notes, upcoming appointments, etc.  Non-urgent messages can be sent to your provider as well.   To learn more about what you can do with MyChart, go to [facility].    Your next appointment:   12 week(s)  The format for your next appointment:   Virtual Visit   Provider:   [email or web address], DO     Other Instructions     Dispo:  Return in about 12 weeks (around 08/31/2021).   Medication Adjustments/Labs and Tests Ordered: Current medicines are reviewed at length with the patient today.  Concerns regarding medicines are outlined above.  Tests Ordered: No orders of the defined types were placed in this encounter.  Medication Changes: No orders of the defined types were placed in this encounter.

## 2021-06-09 DIAGNOSIS — O99815 Abnormal glucose complicating the puerperium: Secondary | ICD-10-CM | POA: Insufficient documentation

## 2021-06-09 HISTORY — DX: Abnormal glucose complicating the puerperium: O99.815

## 2021-07-22 ENCOUNTER — Ambulatory Visit: Payer: Medicaid Other | Admitting: Cardiology

## 2021-08-11 ENCOUNTER — Telehealth: Payer: Self-pay | Admitting: Internal Medicine

## 2021-08-11 NOTE — Telephone Encounter (Signed)
Pt called in and stated that her device just when off and made " little jingle" .  She is not sure what that would be.  She feels fine but she did hear a "jingle in her chest"  ? ?Best number [phone] ?

## 2021-08-12 NOTE — Telephone Encounter (Signed)
Patient reports her device made an alarm sound yesterday but not this morning. States she sent a transmission and wanted it reviewed.  ? ?Reviewed with Shari Prows, MDT and agrees the RV threshold could have triggered the alarm. Shows consistent RV threshold, RV pacing 2.63%. Due to see Dr. Graciela Husbands, apt made 08/19/21 @ 1:30.   ?

## 2021-08-19 ENCOUNTER — Encounter: Payer: Self-pay | Admitting: Internal Medicine

## 2021-08-19 ENCOUNTER — Ambulatory Visit (INDEPENDENT_AMBULATORY_CARE_PROVIDER_SITE_OTHER): Payer: Medicaid Other | Admitting: Internal Medicine

## 2021-08-19 VITALS — BP 110/70 | HR 54 | Ht 65.0 in | Wt 189.0 lb

## 2021-08-19 DIAGNOSIS — I4901 Ventricular fibrillation: Secondary | ICD-10-CM

## 2021-08-19 DIAGNOSIS — I421 Obstructive hypertrophic cardiomyopathy: Secondary | ICD-10-CM | POA: Diagnosis not present

## 2021-08-19 DIAGNOSIS — Z95810 Presence of automatic (implantable) cardiac defibrillator: Secondary | ICD-10-CM

## 2021-08-19 NOTE — Patient Instructions (Signed)
Medication Instructions:  ?Your physician recommends that you continue on your current medications as directed. Please refer to the Current Medication list given to you today. ? ?*If you need a refill on your cardiac medications before your next appointment, please call your pharmacy* ? ? ?Lab Work: ?None ordered. ? ?If you have labs (blood work) drawn today and your tests are completely normal, you will receive your results only by: ?MyChart Message (if you have MyChart) OR ?A paper copy in the mail ?If you have any lab test that is abnormal or we need to change your treatment, we will call you to review the results. ? ? ?Testing/Procedures: ?None ordered. ? ? ? ?Follow-Up: ?At [facility], you and your health needs are our priority.  As part of our continuing mission to provide you with exceptional heart care, we have created designated Provider Care Teams.  These Care Teams include your primary Cardiologist (physician) and Advanced Practice Providers (APPs -  Physician Assistants and Nurse Practitioners) who all work together to provide you with the care you need, when you need it. ? ?We recommend signing up for the patient portal called "MyChart".  Sign up information is provided on this After Visit Summary.  MyChart is used to connect with patients for Virtual Visits (Telemedicine).  Patients are able to view lab/test results, encounter notes, upcoming appointments, etc.  Non-urgent messages can be sent to your provider as well.   ?To learn more about what you can do with MyChart, go to [email or web address].   ? ?Your next appointment:   ?RTC 12 months with Dr Graciela Husbands ? ?Important Information About Sugar ? ? ? ? ?  ?

## 2021-08-19 NOTE — Progress Notes (Signed)
? ? ? ? ?Patient Care Team: ?Sara Lopez as PCP - General (Physician Assistant) ?Sara Salvia, MD as PCP - Electrophysiology (Cardiology) ?Sara Ripple, DO as PCP - Cardiology (Cardiology) ? ? ?HPI ? ?Sara Lopez is a 26 y.o. female seen in follow-up for an ICD implanted for secondary prevention in the setting of hypertrophic obstructive cardiomyopathy.  Initial device implant 2011 and generator replacement 2020 ? ?No interval events. ? ?She recently delivered a girl, 1/23.  Sara Lopez.  Doing great ? ?Comes in with her device having alerted. ?Without complaints of chest pain or shortness of breath ? ? ?Records and Results Reviewed ? ?Past Medical History:  ?Diagnosis Date  ? ADHD   ? Coronary-myocardial bridge   ? LHC at [facility] 2/14: LAD mid myocardial bridge (patent in diastole and near complete occlusion distally) // Myoview 2/14: Inferior attenuation, no ischemia, normal EF  ? Depression   ? Development delay   ? History of echocardiogram 08/2016  ? Echo 5/18: severe asymmetric septal hypertrophy, EF 60-65, dynamic obstruction at rest, peak LVOT 328 cm/sec and peak gradient 43 mmHg, no RWMA, septal thickness 24.6 mm, post wall thickness 15.5 mm, +SAM, mild MR, severe LAE, PASP 30  ? History of echocardiogram 1.2014 at [facility]  ? Echo 1/14: Hypertrophic CM, mild dynamic LVOT - peak 27 mmHg, chordal SAM with LVOT obstruction, mild MR, dilate LCA, abnormal LV diastolic function, hyperdynamic LV systolic function   ? HOCM (hypertrophic obstructive cardiomyopathy) (HCC)   ? ICD (implantable cardioverter-defibrillator) in place   ? Medtronic model [id number] (Protect), serial number [id number], implanted 04/30/2010   ? Myocardial infarction [facility])   ? hx of elevated Tn levels // LHC in 2014 at [facility] with myocardial bridging  ? VF (ventricular fibrillation) (HCC)   ? admitted in 2011 with VF arrest >> dx with HOCM // s/p ICD  ? ? ?Past Surgical History:  ?Procedure Laterality Date  ? ICD GENERATOR  CHANGEOUT N/A 05/10/2018  ? Procedure: ICD GENERATOR CHANGEOUT;  Surgeon: Sara Salvia, MD;  Location: [facility] INVASIVE CV LAB;  Service: Cardiovascular;  Laterality: N/A;  ? ICD IMPLANT  2011  ? ? ?Current Meds  ?Medication Sig  ? acetaminophen (TYLENOL) 325 MG tablet Take 2 tablets (650 mg total) by mouth every 4 (four) hours as needed (for pain scale < 4).  ? ibuprofen (ADVIL) 600 MG tablet Take 1 tablet (600 mg total) by mouth every 6 (six) hours.  ? metoprolol succinate (TOPROL-XL) 25 MG 24 hr tablet Take 0.5 tablets (12.5 mg total) by mouth daily.  ? norethindrone (MICRONOR) 0.35 MG tablet Take 1 tablet (0.35 mg total) by mouth daily.  ? Prenatal Vit-Fe Fumarate-FA (MULTIVITAMIN-PRENATAL) 27-0.8 MG TABS tablet Take 1 tablet by mouth daily at 12 noon.  ? ? ?No Known Allergies ? ? ? ?Review of Systems negative except from HPI and PMH ? ?Physical Exam ?BP 110/70 (BP Location: Left Arm, Patient Position: Sitting, Cuff Size: Normal)   Pulse (!) 54   Ht 5\' 5"  (1.651 m)   Wt 189 lb (85.7 kg)   SpO2 97%   BMI 31.45 kg/m?  ?Well developed and well nourished in no acute distress ?HENT normal ?E scleral and icterus clear ?Neck Supple ?JVP flat; carotids brisk and full ?Clear to ausculation ?Regular rate and rhythm, no murmurs gallops or rub ?Soft with active bowel sounds ?No clubbing cyanosis  Edema ?Alert and oriented, grossly normal motor and sensory function ?Skin Warm and Dry ? ?ECG  sinus at 54 ?Interval 17/09/44 ? ?CrCl cannot be calculated (Patient's most recent lab result is older than the maximum 21 days allowed.). ? ? ?Assessment and  Plan ?HOCM ?  ?Cardiac Arrest Resuscitated ?  ?ICD - Medtronic ?  ?Tachypalpitations  ? ?RV capture alert ?  ? ?No interval arrhythmias.  Functional status is stable.  Continue her on metoprolol 12.5 uptitration which is limited by her bradycardia. ? ?Alert was for RV capture and this has been inactivated. ? ? ?Current medicines are reviewed at length with the patient today .  The  patient does not  have concerns regarding medicines. ?Yes ?

## 2021-08-24 ENCOUNTER — Ambulatory Visit (INDEPENDENT_AMBULATORY_CARE_PROVIDER_SITE_OTHER): Payer: Medicaid Other

## 2021-08-24 DIAGNOSIS — I4901 Ventricular fibrillation: Secondary | ICD-10-CM

## 2021-08-25 LAB — CUP PACEART REMOTE DEVICE CHECK
Battery Remaining Longevity: 110 mo
Battery Voltage: 3 V
Brady Statistic RV Percent Paced: 1.07 %
Date Time Interrogation Session: 20230424052704
HighPow Impedance: 60 Ohm
Implantable Lead Implant Date: 20111229
Implantable Lead Location: 753860
Implantable Lead Model: 7122
Implantable Pulse Generator Implant Date: 20200108
Lead Channel Impedance Value: 855 Ohm
Lead Channel Impedance Value: 950 Ohm
Lead Channel Pacing Threshold Amplitude: 2.5 V
Lead Channel Pacing Threshold Pulse Width: 0.4 ms
Lead Channel Sensing Intrinsic Amplitude: 15.25 mV
Lead Channel Sensing Intrinsic Amplitude: 15.25 mV
Lead Channel Setting Pacing Amplitude: 3.5 V
Lead Channel Setting Pacing Pulse Width: 1 ms
Lead Channel Setting Sensing Sensitivity: 0.6 mV

## 2021-09-01 ENCOUNTER — Telehealth (INDEPENDENT_AMBULATORY_CARE_PROVIDER_SITE_OTHER): Payer: Medicaid Other | Admitting: Cardiology

## 2021-09-01 ENCOUNTER — Encounter: Payer: Self-pay | Admitting: Cardiology

## 2021-09-01 DIAGNOSIS — O99815 Abnormal glucose complicating the puerperium: Secondary | ICD-10-CM

## 2021-09-01 DIAGNOSIS — I4901 Ventricular fibrillation: Secondary | ICD-10-CM

## 2021-09-01 DIAGNOSIS — Z95810 Presence of automatic (implantable) cardiac defibrillator: Secondary | ICD-10-CM | POA: Diagnosis not present

## 2021-09-01 DIAGNOSIS — Z8674 Personal history of sudden cardiac arrest: Secondary | ICD-10-CM | POA: Diagnosis not present

## 2021-09-01 NOTE — Patient Instructions (Addendum)
Medication Instructions:  ?Your physician recommends that you continue on your current medications as directed. Please refer to the Current Medication list given to you today.  ?*If you need a refill on your cardiac medications before your next appointment, please call your pharmacy* ? ? ?Lab Work: ?None ?If you have labs (blood work) drawn today and your tests are completely normal, you will receive your results only by: ?MyChart Message (if you have MyChart) OR ?A paper copy in the mail ?If you have any lab test that is abnormal or we need to change your treatment, we will call you to review the results. ? ? ?Testing/Procedures: ?None ? ? ?Follow-Up: ?At [facility], you and your health needs are our priority.  As part of our continuing mission to provide you with exceptional heart care, we have created designated Provider Care Teams.  These Care Teams include your primary Cardiologist (physician) and Advanced Practice Providers (APPs -  Physician Assistants and Nurse Practitioners) who all work together to provide you with the care you need, when you need it. ? ?We recommend signing up for the patient portal called "MyChart".  Sign up information is provided on this After Visit Summary.  MyChart is used to connect with patients for Virtual Visits (Telemedicine).  Patients are able to view lab/test results, encounter notes, upcoming appointments, etc.  Non-urgent messages can be sent to your provider as well.   ?To learn more about what you can do with MyChart, go to [email or web address].   ? ?Your next appointment:   ?As scheduled ? ?The format for your next appointment:   ?In Person ? ?Provider:   ?Primary Cardiology ? ?Today was your last visit with Dr. Servando Salina for Cardio-obstetrics. You will follow up with your primary cardiologist as scheduled.  ?It has been a pleasure to serve you. ? ? ?Important Information About Sugar ? ? ? ? ?  ? ?

## 2021-09-01 NOTE — Progress Notes (Signed)
?Cardio-Obstetrics Clinic ? ?Follow Up Note ? ? ?Date:  09/02/2021  ? ?ID:  Sara Lopez, DOB Oct 29, 1995, MRN [id number] ? ?PCP:  Loletta SpecterGomez, Roger David, PA-C ?  ?[facility] Providers ?Cardiologist:  None  ?Electrophysiologist:  Sherryl MangesSteven Klein, MD      ? ? ?Referring MD: Loletta SpecterGomez, Roger David, PA-C  ? ?Chief Complaint: I am doing fine ? ?The patient is at home. ?I am in the office. ? ?Virtual Visit via telephone  Note . I connected with the patient today by a telephone enabled telemedicine application and verified that I am speaking with the correct person using two identifiers. ? ?History of Present Illness:   ? ?Sara Lopez is a 26 y.o. female [G1P1001] who returns for follow up of postpartum visit. ? ?Her medical history includes of hypertrophic cardiomyopathy diagnosed as a teenager is now status post Medtronic ICD due to ventricular arrhythmia for secondary prevention with initial ICD placement in 2011 and later change in 2020 she has rerelease follow-up with Dr. 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 ?  ?I saw the patient on March 17, 2021 at that time she is doing well from a cardiovascular standpoint.  She was in her third trimester pregnancy.   ?  ?During her visit she was asymptomatic no shortness of breath, no chest pain no lightheadedness no dizziness.  We talked about her delivery plans and all of her questions were answered at that time.   ?  ?I scheduled the patient for an echocardiogram.  She was able to get her echocardiogram with did not show any resting gradients.  She safely delivered her baby on May 06, 2021. ?  ?I saw the patient on 05/25/2021 at that time she was experiencing significant shortness of breath and there was some report on her device interrogation showing fluid overload so I started the patient on diuretics for short while.  And then transition to add a beta-blocker as  well. ? ?She was seen on June 08, 2021 at that time she appeared to be doing well from a cardiovascular standpoint.  We continued her beta-blocker.  Stop the Lasix. ? ?Since I saw the patient she had seen Dr. Graciela HusbandsKlein.  And he recommended she continue her metoprolol. ? ?Today she tells me that she has not been taking the metoprolol.  She denies any chest pain or shortness of breath.  She feels that she is at a good baseline. ? ? ?Prior CV Studies Reviewed: ?The following studies were reviewed today: ? ?Transthoracic echocardiogram May 26, 2021 ?IMPRESSIONS  ? ? ? 1. There is no significant outflow tract gradient. Left ventricular  ?ejection fraction, by estimation, is 65 to 70%. The left ventricle has  ?normal function. The left ventricle has no regional wall motion  ?abnormalities. There is moderate left ventricular  ?hypertrophy. Left ventricular diastolic parameters are consistent with  ?Grade I diastolic dysfunction (impaired relaxation).  ? 2. Right ventricular systolic function is normal. The right ventricular  ?size is mildly enlarged. There is normal pulmonary artery systolic  ?pressure. The estimated right ventricular systolic pressure is 23.4 mmHg.  ? 3. Left atrial size was moderately dilated.  ? 4. A small pericardial effusion is present. The pericardial effusion is  ?circumferential. There is no evidence of cardiac tamponade.  ? 5. Systolic motion of the mitral valve. The mitral valve is normal in  ?structure. No evidence of mitral valve  regurgitation. No evidence of  ?mitral stenosis.  ? 6. The aortic valve is normal in structure. Aortic valve regurgitation is  ?mild. No aortic stenosis is present.  ? 7. Pulmonic valve regurgitation is moderate.  ? 8. The inferior vena cava is normal in size with greater than 50%  ?respiratory variability, suggesting right atrial pressure of 3 mmHg.  ? ?Comparison(s): No significant change from prior study. Prior images  ?reviewed side by side.   ? ?Conclusion(s)/Recommendation(s): Findings consistent with hypertrophic  ?cardiomyopathy.  ? ?FINDINGS  ? Left Ventricle: There is no significant outflow tract gradient. Left  ?ventricular ejection fraction, by estimation, is 65 to 70%. The left  ?ventricle has normal function. The left ventricle has no regional wall  ?motion abnormalities. The left ventricular  ?internal cavity size was normal in size. There is moderate left  ?ventricular hypertrophy. Left ventricular diastolic parameters are  ?consistent with Grade I diastolic dysfunction (impaired relaxation).  ? ?Right Ventricle: The right ventricular size is mildly enlarged. No  ?increase in right ventricular wall thickness. Right ventricular systolic  ?function is normal. There is normal pulmonary artery systolic pressure.  ?The tricuspid regurgitant velocity is 2.26  ? m/s, and with an assumed right atrial pressure of 3 mmHg, the estimated  ?right ventricular systolic pressure is 23.4 mmHg.  ? ?Left Atrium: Left atrial size was moderately dilated.  ? ?Right Atrium: Right atrial size was normal in size.  ? ?Pericardium: A small pericardial effusion is present. The pericardial  ?effusion is circumferential. There is no evidence of cardiac tamponade.  ? ?Mitral Valve: Systolic motion of the mitral valve. The mitral valve is  ?normal in structure. No evidence of mitral valve regurgitation. No  ?evidence of mitral valve stenosis.  ? ?Tricuspid Valve: The tricuspid valve is normal in structure. Tricuspid  ?valve regurgitation is trivial. No evidence of tricuspid stenosis.  ? ?Aortic Valve: The aortic valve is normal in structure. Aortic valve  ?regurgitation is mild. Aortic regurgitation PHT measures 573 msec. No  ?aortic stenosis is present.  ? ?Pulmonic Valve: The pulmonic valve was normal in structure. Pulmonic valve  ?regurgitation is moderate. No evidence of pulmonic stenosis.  ? ?Aorta: The aortic root is normal in size and structure.  ? ?Venous: The  inferior vena cava is normal in size with greater than 50%  ?respiratory variability, suggesting right atrial pressure of 3 mmHg.  ? ?IAS/Shunts: No atrial level shunt detected by color flow Doppler.  ? ?Additional Comments: A device lead is visualized in the right ventricle.  ? ?Past Medical History:  ?Diagnosis Date  ? ADHD   ? Coronary-myocardial bridge   ? LHC at [facility] 2/14: LAD mid myocardial bridge (patent in diastole and near complete occlusion distally) // Myoview 2/14: Inferior attenuation, no ischemia, normal EF  ? Depression   ? Development delay   ? History of echocardiogram 08/2016  ? Echo 5/18: severe asymmetric septal hypertrophy, EF 60-65, dynamic obstruction at rest, peak LVOT 328 cm/sec and peak gradient 43 mmHg, no RWMA, septal thickness 24.6 mm, post wall thickness 15.5 mm, +SAM, mild MR, severe LAE, PASP 30  ? History of echocardiogram 1.2014 at [facility]  ? Echo 1/14: Hypertrophic CM, mild dynamic LVOT - peak 27 mmHg, chordal SAM with LVOT obstruction, mild MR, dilate LCA, abnormal LV diastolic function, hyperdynamic LV systolic function   ? HOCM (hypertrophic obstructive cardiomyopathy) (HCC)   ? ICD (implantable cardioverter-defibrillator) in place   ? Medtronic model [id number] (Protect), serial number [id number], implanted 04/30/2010   ?  Myocardial infarction [facility])   ? hx of elevated Tn levels // LHC in 2014 at [facility] with myocardial bridging  ? VF (ventricular fibrillation) (HCC)   ? admitted in 2011 with VF arrest >> dx with HOCM // s/p ICD  ? ? ?Past Surgical History:  ?Procedure Laterality Date  ? ICD GENERATOR CHANGEOUT N/A 05/10/2018  ? Procedure: ICD GENERATOR CHANGEOUT;  Surgeon: Duke Salvia, MD;  Location: [facility] INVASIVE CV LAB;  Service: Cardiovascular;  Laterality: N/A;  ? ICD IMPLANT  2011  ?   ? ?OB History   ? ? Gravida  ?1  ? Para  ?1  ? Term  ?1  ? Preterm  ?   ? AB  ?   ? Living  ?1  ?  ? ? SAB  ?   ? IAB  ?   ? Ectopic  ?   ? Multiple  ?0  ? Live Births  ?1  ?   ?  ?  ?     ? ? ?Current Medications: ?Current Meds  ?Medication Sig  ? acetaminophen (TYLENOL) 325 MG tablet Take 2 tablets (650 mg total) by mouth every 4 (four) hours as needed (for pain scale < 4).  ? ibuprofen (ADVIL) 600 MG tablet Take 1 tablet (60

## 2021-09-08 NOTE — Progress Notes (Signed)
Remote ICD transmission.   

## 2021-11-23 ENCOUNTER — Ambulatory Visit (INDEPENDENT_AMBULATORY_CARE_PROVIDER_SITE_OTHER): Payer: Medicaid Other

## 2021-11-23 DIAGNOSIS — I4901 Ventricular fibrillation: Secondary | ICD-10-CM

## 2021-11-24 LAB — CUP PACEART REMOTE DEVICE CHECK
Battery Remaining Longevity: 107 mo
Battery Voltage: 3.01 V
Brady Statistic RV Percent Paced: 5.3 %
Date Time Interrogation Session: 20230725005405
HighPow Impedance: 70 Ohm
Implantable Lead Implant Date: 20111229
Implantable Lead Location: 753860
Implantable Lead Model: 7122
Implantable Pulse Generator Implant Date: 20200108
Lead Channel Impedance Value: 1026 Ohm
Lead Channel Impedance Value: 912 Ohm
Lead Channel Pacing Threshold Amplitude: 2.5 V
Lead Channel Pacing Threshold Pulse Width: 0.4 ms
Lead Channel Sensing Intrinsic Amplitude: 16.125 mV
Lead Channel Sensing Intrinsic Amplitude: 16.125 mV
Lead Channel Setting Pacing Amplitude: 3.5 V
Lead Channel Setting Pacing Pulse Width: 1 ms
Lead Channel Setting Sensing Sensitivity: 0.6 mV

## 2022-01-01 NOTE — Progress Notes (Signed)
Remote ICD transmission.   

## 2022-02-22 ENCOUNTER — Ambulatory Visit (INDEPENDENT_AMBULATORY_CARE_PROVIDER_SITE_OTHER): Payer: Medicaid Other

## 2022-02-22 DIAGNOSIS — I421 Obstructive hypertrophic cardiomyopathy: Secondary | ICD-10-CM | POA: Diagnosis not present

## 2022-02-24 LAB — CUP PACEART REMOTE DEVICE CHECK
Battery Remaining Longevity: 105 mo
Battery Voltage: 3.01 V
Brady Statistic RV Percent Paced: 8.62 %
Date Time Interrogation Session: 20231023043725
HighPow Impedance: 68 Ohm
Implantable Lead Connection Status: 753985
Implantable Lead Implant Date: 20111229
Implantable Lead Location: 753860
Implantable Lead Model: 7122
Implantable Pulse Generator Implant Date: 20200108
Lead Channel Impedance Value: 1026 Ohm
Lead Channel Impedance Value: 912 Ohm
Lead Channel Pacing Threshold Amplitude: 2.5 V
Lead Channel Pacing Threshold Pulse Width: 0.4 ms
Lead Channel Sensing Intrinsic Amplitude: 15.625 mV
Lead Channel Sensing Intrinsic Amplitude: 15.625 mV
Lead Channel Setting Pacing Amplitude: 3.5 V
Lead Channel Setting Pacing Pulse Width: 1 ms
Lead Channel Setting Sensing Sensitivity: 0.6 mV
Zone Setting Status: 755011
Zone Setting Status: 755011

## 2022-03-24 NOTE — Progress Notes (Signed)
Remote ICD transmission.   

## 2022-05-24 ENCOUNTER — Ambulatory Visit: Payer: Medicaid Other | Attending: Internal Medicine

## 2022-05-24 DIAGNOSIS — I421 Obstructive hypertrophic cardiomyopathy: Secondary | ICD-10-CM

## 2022-05-26 LAB — CUP PACEART REMOTE DEVICE CHECK
Battery Remaining Longevity: 101 mo
Battery Voltage: 3.01 V
Brady Statistic RV Percent Paced: 8.29 %
Date Time Interrogation Session: 20240122042507
HighPow Impedance: 63 Ohm
Implantable Lead Connection Status: 753985
Implantable Lead Implant Date: 20111229
Implantable Lead Location: 753860
Implantable Lead Model: 7122
Implantable Pulse Generator Implant Date: 20200108
Lead Channel Impedance Value: 855 Ohm
Lead Channel Impedance Value: 969 Ohm
Lead Channel Pacing Threshold Amplitude: 2.5 V
Lead Channel Pacing Threshold Pulse Width: 0.4 ms
Lead Channel Sensing Intrinsic Amplitude: 14.5 mV
Lead Channel Sensing Intrinsic Amplitude: 14.5 mV
Lead Channel Setting Pacing Amplitude: 3.5 V
Lead Channel Setting Pacing Pulse Width: 1 ms
Lead Channel Setting Sensing Sensitivity: 0.6 mV
Zone Setting Status: 755011
Zone Setting Status: 755011

## 2022-07-12 NOTE — Progress Notes (Signed)
Remote ICD transmission.   

## 2022-08-23 ENCOUNTER — Ambulatory Visit (INDEPENDENT_AMBULATORY_CARE_PROVIDER_SITE_OTHER): Payer: Medicaid Other

## 2022-08-23 DIAGNOSIS — I421 Obstructive hypertrophic cardiomyopathy: Secondary | ICD-10-CM | POA: Diagnosis not present

## 2022-08-24 LAB — CUP PACEART REMOTE DEVICE CHECK
Battery Remaining Longevity: 98 mo
Battery Voltage: 3.01 V
Brady Statistic RV Percent Paced: 9.85 %
Date Time Interrogation Session: 20240422022703
HighPow Impedance: 68 Ohm
Implantable Lead Connection Status: 753985
Implantable Lead Implant Date: 20111229
Implantable Lead Location: 753860
Implantable Lead Model: 7122
Implantable Pulse Generator Implant Date: 20200108
Lead Channel Impedance Value: 1083 Ohm
Lead Channel Impedance Value: 988 Ohm
Lead Channel Pacing Threshold Amplitude: 2.5 V
Lead Channel Pacing Threshold Pulse Width: 0.4 ms
Lead Channel Sensing Intrinsic Amplitude: 15.875 mV
Lead Channel Sensing Intrinsic Amplitude: 15.875 mV
Lead Channel Setting Pacing Amplitude: 3.5 V
Lead Channel Setting Pacing Pulse Width: 1 ms
Lead Channel Setting Sensing Sensitivity: 0.6 mV
Zone Setting Status: 755011
Zone Setting Status: 755011

## 2022-09-21 ENCOUNTER — Ambulatory Visit (INDEPENDENT_AMBULATORY_CARE_PROVIDER_SITE_OTHER): Payer: Medicaid Other

## 2022-09-21 DIAGNOSIS — Z3201 Encounter for pregnancy test, result positive: Secondary | ICD-10-CM | POA: Diagnosis not present

## 2022-09-21 DIAGNOSIS — Z3200 Encounter for pregnancy test, result unknown: Secondary | ICD-10-CM

## 2022-09-21 LAB — POCT PREGNANCY, URINE: Preg Test, Ur: POSITIVE — AB

## 2022-09-21 NOTE — Progress Notes (Signed)
Possible Pregnancy  Here today for pregnancy confirmation. UPT in office today is positive. Called pt; VM left stating I am calling with results. MyChart message sent for follow up.   Marjo Bicker, RN 09/21/2022  5:53 PM

## 2022-09-29 NOTE — Progress Notes (Signed)
Remote ICD transmission.   

## 2022-11-01 ENCOUNTER — Telehealth: Payer: Self-pay

## 2022-11-01 NOTE — Telephone Encounter (Signed)
Attempted to call pt. Received a message "your call can not be completed as dialed." Attempted to call again, received same message. Will send MyChart message.

## 2022-11-09 ENCOUNTER — Ambulatory Visit (INDEPENDENT_AMBULATORY_CARE_PROVIDER_SITE_OTHER): Payer: Medicaid Other

## 2022-11-09 VITALS — Wt 180.6 lb

## 2022-11-09 DIAGNOSIS — Z3687 Encounter for antenatal screening for uncertain dates: Secondary | ICD-10-CM

## 2022-11-09 DIAGNOSIS — Z3480 Encounter for supervision of other normal pregnancy, unspecified trimester: Secondary | ICD-10-CM | POA: Diagnosis not present

## 2022-11-09 DIAGNOSIS — O0990 Supervision of high risk pregnancy, unspecified, unspecified trimester: Secondary | ICD-10-CM

## 2022-11-09 NOTE — Patient Instructions (Signed)

## 2022-11-09 NOTE — Progress Notes (Signed)
New OB Intake  I connected with Sara Lopez  on 11/09/22 at  2:15 PM EDT in person and verified that I am speaking with the correct person using two identifiers. Nurse is located at [facility] and pt is located at [employer].  I discussed the limitations, risks, security and privacy concerns of performing an evaluation and management service by telephone and the availability of in person appointments. I also discussed with the patient that there may be a patient responsible charge related to this service. The patient expressed understanding and agreed to proceed.  I explained I am completing New OB Intake today. We discussed EDD of Not found.. Pt is G1P1001. I reviewed her allergies, medications and Medical/Surgical/OB history.    Patient Active Problem List   Diagnosis Date Noted   Postpartum visit 06/09/2021   Vacuum-assisted vaginal delivery 05/06/2021   Pregnancy 05/05/2021   Red Chart Rounds Patient 12/19/2020   Supervision of high risk pregnancy, antepartum 10/28/2020   Cardiac pacemaker in situ 03/27/2018   Heart disease 03/27/2018   HOCM (hypertrophic obstructive cardiomyopathy) (HCC) 09/21/2016   History of cardiac arrest 09/21/2016   Coronary-myocardial bridge    History of MI (myocardial infarction) 09/16/2016   ADHD (attention deficit hyperactivity disorder) 07/21/2011   Depression 07/21/2011   Developmental delay 07/21/2011   VF (ventricular fibrillation) (HCC) 07/21/2011   Single implantable cardioverter-defibrillator (ICD) in situ 07/21/2011    Concerns addressed today  Delivery Plans Plans to deliver at [facility]. Discussed the nature of our practice with multiple providers including residents and students. Due to the size of the practice, the delivering provider may not be the same as those providing prenatal care.   MyChart/Babyscripts MyChart access verified. I explained pt will have some visits in office and some virtually. Babyscripts instructions given and  order placed. Patient verifies receipt of registration text/e-mail. Account successfully created and app downloaded.  Blood Pressure Cuff/Weight Scale Pt has Blood pressure cuff. Explained after first prenatal appt pt will check weekly and document in Babyscripts.  Anatomy US Explained first scheduled [place] will be around 19 weeks. Anatomy US not scheduled at intake due to no EDD. Dating [place] scheduled.   Is patient a CenteringPregnancy candidate?  Not a Candidate Not a candidate due to Complex coordination of care needed If accepted,    Is patient a Mom+Baby Combined Care candidate?  Not a candidate   If accepted, confirm patient does not intend to move from the area for at least 12 months, then notify Mom+Baby staff  Interested in [place]? If yes, send referral and doula dot phrase.    First visit review I reviewed new OB appt with patient. Explained pt will be seen by Dr. Crissie Reese at first visit. Discussed Avelina Lopez genetic screening with patient. Routine prenatal labs is needed at new ob visit.  Last Pap Diagnosis  Date Value Ref Range Status  11/07/2020   Final   - Negative for intraepithelial lesion or malignancy (NILM)    Sara Lopez, CMA 11/09/2022  2:04 PM

## 2022-11-09 NOTE — Addendum Note (Signed)
Addended by: Faythe Casa on: 11/09/2022 04:23 PM   Modules accepted: Orders

## 2022-11-10 ENCOUNTER — Ambulatory Visit (INDEPENDENT_AMBULATORY_CARE_PROVIDER_SITE_OTHER): Payer: Medicaid Other

## 2022-11-10 DIAGNOSIS — Z3687 Encounter for antenatal screening for uncertain dates: Secondary | ICD-10-CM

## 2022-11-10 DIAGNOSIS — Z3A16 16 weeks gestation of pregnancy: Secondary | ICD-10-CM | POA: Diagnosis not present

## 2022-11-16 ENCOUNTER — Other Ambulatory Visit (HOSPITAL_COMMUNITY)
Admission: RE | Admit: 2022-11-16 | Discharge: 2022-11-16 | Disposition: A | Discharge status: Home / Self Care | Payer: Medicaid Other | Source: Ambulatory Visit | Attending: Family Medicine | Admitting: Family Medicine

## 2022-11-16 ENCOUNTER — Other Ambulatory Visit: Payer: Self-pay

## 2022-11-16 ENCOUNTER — Ambulatory Visit (INDEPENDENT_AMBULATORY_CARE_PROVIDER_SITE_OTHER): Payer: Medicaid Other | Admitting: Family Medicine

## 2022-11-16 ENCOUNTER — Encounter: Payer: Self-pay | Admitting: Family Medicine

## 2022-11-16 VITALS — BP 118/87 | HR 78 | Wt 178.8 lb

## 2022-11-16 DIAGNOSIS — I252 Old myocardial infarction: Secondary | ICD-10-CM

## 2022-11-16 DIAGNOSIS — O0990 Supervision of high risk pregnancy, unspecified, unspecified trimester: Secondary | ICD-10-CM | POA: Insufficient documentation

## 2022-11-16 DIAGNOSIS — Z3009 Encounter for other general counseling and advice on contraception: Secondary | ICD-10-CM

## 2022-11-16 DIAGNOSIS — Z950 Presence of cardiac pacemaker: Secondary | ICD-10-CM

## 2022-11-16 DIAGNOSIS — Z3A16 16 weeks gestation of pregnancy: Secondary | ICD-10-CM

## 2022-11-16 DIAGNOSIS — O0992 Supervision of high risk pregnancy, unspecified, second trimester: Secondary | ICD-10-CM | POA: Diagnosis not present

## 2022-11-16 DIAGNOSIS — I421 Obstructive hypertrophic cardiomyopathy: Secondary | ICD-10-CM | POA: Diagnosis not present

## 2022-11-16 DIAGNOSIS — I4901 Ventricular fibrillation: Secondary | ICD-10-CM | POA: Diagnosis not present

## 2022-11-16 DIAGNOSIS — Q245 Malformation of coronary vessels: Secondary | ICD-10-CM

## 2022-11-16 MED ORDER — ASPIRIN 81 MG PO TBEC: 81.0000 mg | DELAYED_RELEASE_TABLET | Freq: Every day | ORAL | 12 refills | Status: DC

## 2022-11-16 NOTE — Progress Notes (Signed)
Subjective:   Sara Lopez is a 27 y.o. G2P1001 at [redacted]w[redacted]d by 16 wk [place] being seen today for her first obstetrical visit.  Her obstetrical history is significant for  HOCM, coronary-myocardial bridge, ICD in situ . Patient does intend to breast feed. Pregnancy history fully reviewed.  Patient reports no complaints.  HISTORY: OB History  Gravida Para Term Preterm AB Living  2 1 1  0 0 1  SAB IAB Ectopic Multiple Live Births  0 0 0 0 1    # Outcome Date GA Lbr Len/2nd Weight Sex Type Anes PTL Lv  2 Current           1 Term 05/06/21 [redacted]w[redacted]d / 02:06 8 lb 2.9 oz (3.71 kg) F Vag-Vacuum EPI  LIV     Birth Comments: wnl     Name: TRINKA, KESHISHYAN     Apgar1: 8  Apgar5: 9     Last pap smear: Lab Results  Component Value Date   DIAGPAP  11/07/2020    - Negative for intraepithelial lesion or malignancy (NILM)    Past Medical History:  Diagnosis Date   ADHD    ADHD (attention deficit hyperactivity disorder) 07/21/2011   Cardiac pacemaker in situ 03/27/2018   Coronary-myocardial bridge    LHC at [facility] 2/14: LAD mid myocardial bridge (patent in diastole and near complete occlusion distally) // Myoview 2/14: Inferior attenuation, no ischemia, normal EF   Depression    Development delay    Developmental delay 07/21/2011   History of cardiac arrest 09/21/2016   History of echocardiogram 08/2016   Echo 5/18: severe asymmetric septal hypertrophy, EF 60-65, dynamic obstruction at rest, peak LVOT 328 cm/sec and peak gradient 43 mmHg, no RWMA, septal thickness 24.6 mm, post wall thickness 15.5 mm, +SAM, mild MR, severe LAE, PASP 30   History of echocardiogram 1.2014 at [facility]   Echo 1/14: Hypertrophic CM, mild dynamic LVOT - peak 27 mmHg, chordal SAM with LVOT obstruction, mild MR, dilate LCA, abnormal LV diastolic function, hyperdynamic LV systolic function    History of MI (myocardial infarction) 09/16/2016   HOCM (hypertrophic obstructive cardiomyopathy) (HCC)    ICD (implantable  cardioverter-defibrillator) in place    Medtronic model [id number] (Protect), serial number [id number], implanted 04/30/2010    Myocardial infarction (HCC)    hx of elevated Tn levels // LHC in 2014 at [facility] with myocardial bridging   Postpartum visit 06/09/2021   Red Chart Rounds Patient 12/19/2020   Red Chart Rounds Patient Care Plan     Layna J Rentz  G1P0  Current GA: [redacted]w[redacted]d          OB History  Gravida  Para  Term  Preterm  AB  Living  1  0  0  0  0  0  SAB  IAB  Ectopic  Multiple  Live Births  0  0  0  0  0        Patient encounter date not specified.        Red Chart Rounds Diagnosis: HOCM s/p remote cardiac arrest with ICD in place, coronary-myocardial bridge     Consultants: Cards,    Single implantable cardioverter-defibrillator (ICD) in situ 07/21/2011   VF (ventricular fibrillation) (HCC)    admitted in 2011 with VF arrest >> dx with HOCM // s/p ICD   Past Surgical History:  Procedure Laterality Date   ICD GENERATOR CHANGEOUT N/A 05/10/2018   Procedure: ICD GENERATOR CHANGEOUT;  Surgeon: Duke Salvia,  MD;  Location: [facility] INVASIVE CV LAB;  Service: Cardiovascular;  Laterality: N/A;   ICD IMPLANT  2011   Family History  Adopted: Yes   Social History   Tobacco Use   Smoking status: Former    Current packs/day: 0.00    Types: Cigarettes    Quit date: 07/01/2020    Years since quitting: 2.3   Smokeless tobacco: Former  Building services engineer status: Former   Substances: Nicotine   Devices: not since preg 06/2020  Substance Use Topics   Alcohol use: Not Currently    Alcohol/week: 1.0 standard drink of alcohol    Types: 1 Glasses of wine per week    Comment: not since preg   Drug use: No    Types: Marijuana    Comment: no drugs since 2011   No Known Allergies Current Outpatient Medications on File Prior to Visit  Medication Sig Dispense Refill   Prenatal Vit-Fe Fumarate-FA (MULTIVITAMIN-PRENATAL) 27-0.8 MG TABS tablet Take 1 tablet by mouth daily at 12 noon.     No current  facility-administered medications on file prior to visit.     Exam   Vitals:   11/16/22 1505  BP: 118/87  Pulse: 78  Weight: 178 lb 12.8 oz (81.1 kg)   Fetal Heart Rate (bpm): 148  System: General: well-developed, well-nourished female in no acute distress   Skin: normal coloration and turgor, no rashes   Neurologic: oriented, normal, negative, normal mood   Extremities: normal strength, tone, and muscle mass, ROM of all joints is normal   HEENT PERRLA, extraocular movement intact and sclera clear, anicteric   Neck supple and no masses   Respiratory:  no respiratory distress      Assessment:   Pregnancy: G2P1001 Patient Active Problem List   Diagnosis Date Noted   Supervision of high risk pregnancy, antepartum 11/09/2022   Heart disease 03/27/2018   HOCM (hypertrophic obstructive cardiomyopathy) (HCC) 09/21/2016   Coronary-myocardial bridge    VF (ventricular fibrillation) (HCC) 07/21/2011     Plan:  1. HOCM (hypertrophic obstructive cardiomyopathy) (HCC) Diagnosed 2011 after cardiac arrest/VFib, ICD placed Has also had cath at [facility] in 2014 that showed myocardial bridge, normal stress test Follows with [facility] EP Dr. Graciela Husbands Will ensure she has follow up in person visit with Dr. Graciela Husbands as well as Cardio OB clinic Will also coordinate fetal echo for hx of congenital heart disease - [place] MFM OB DETAIL +14 WK; Future  2. Coronary-myocardial bridge  - [place] MFM OB DETAIL +14 WK; Future  3. Cardiac pacemaker in situ Attends EP clinic regularly for checks, see above - [place] MFM OB DETAIL +14 WK; Future  4. History of MI (myocardial infarction)  - [place] MFM OB DETAIL +14 WK; Future  5. Supervision of high risk pregnancy, antepartum BP and FHR normal Initial labs drawn. Continue prenatal vitamins. Genetic Screening discussed, NIPS: ordered. Ultrasound discussed; fetal anatomic survey: ordered. Problem list reviewed and updated. The nature of [facility]  Faculty Practice with multiple MDs and other Advanced Practice Providers was explained to patient; also emphasized that residents, students are part of our team.  6. VF (ventricular fibrillation) (HCC) See above  7. Unwanted fertility Sign BTL papers at 28 weeks  Routine obstetric precautions reviewed. Return in about 4 weeks (around 12/14/2022) for [facility], ob visit, needs MD.

## 2022-11-17 LAB — CBC/D/PLT+RPR+RH+ABO+RUBIGG...
Antibody Screen: NEGATIVE
Basophils Absolute: 0 10*3/uL (ref 0.0–0.2)
Basos: 0 %
EOS (ABSOLUTE): 0 10*3/uL (ref 0.0–0.4)
Eos: 1 %
HCV Ab: NONREACTIVE
HIV Screen 4th Generation wRfx: NONREACTIVE
Hematocrit: 36.1 % (ref 34.0–46.6)
Hemoglobin: 12.6 g/dL (ref 11.1–15.9)
Hepatitis B Surface Ag: NEGATIVE
Immature Grans (Abs): 0 10*3/uL (ref 0.0–0.1)
Immature Granulocytes: 0 %
Lymphocytes Absolute: 1.6 10*3/uL (ref 0.7–3.1)
Lymphs: 28 %
MCH: 31.2 pg (ref 26.6–33.0)
MCHC: 34.9 g/dL (ref 31.5–35.7)
MCV: 89 fL (ref 79–97)
Monocytes Absolute: 0.4 10*3/uL (ref 0.1–0.9)
Monocytes: 6 %
Neutrophils Absolute: 3.7 10*3/uL (ref 1.4–7.0)
Neutrophils: 65 %
Platelets: 199 10*3/uL (ref 150–450)
RBC: 4.04 x10E6/uL (ref 3.77–5.28)
RDW: 12.3 % (ref 11.7–15.4)
RPR Ser Ql: NONREACTIVE
Rh Factor: POSITIVE
Rubella Antibodies, IGG: 0.99 index — ABNORMAL LOW (ref >0.99)
WBC: 5.7 10*3/uL (ref 3.4–10.8)

## 2022-11-17 LAB — HEMOGLOBIN A1C
Est. average glucose Bld gHb Est-mCnc: 91 mg/dL
Hgb A1c MFr Bld: 4.8 % (ref 4.8–5.6)

## 2022-11-17 LAB — HCV INTERPRETATION

## 2022-11-18 ENCOUNTER — Encounter: Payer: Self-pay | Admitting: Family Medicine

## 2022-11-18 DIAGNOSIS — Z2839 Other underimmunization status: Secondary | ICD-10-CM | POA: Insufficient documentation

## 2022-11-18 LAB — GC/CHLAMYDIA PROBE AMP (~~LOC~~) NOT AT ARMC
Chlamydia: NEGATIVE
Comment: NEGATIVE
Comment: NORMAL
Neisseria Gonorrhea: NEGATIVE

## 2022-11-18 LAB — AFP, SERUM, OPEN SPINA BIFIDA
AFP MoM: 1.24
AFP Value: 42.1 ng/mL
Gest. Age on Collection Date: 16.9 weeks
Maternal Age At EDD: 28.9 yr
OSBR Risk 1 IN: 5748
Test Results:: NEGATIVE
Weight: 180 [lb_av]

## 2022-11-18 LAB — CULTURE, OB URINE

## 2022-11-18 LAB — URINE CULTURE, OB REFLEX

## 2022-11-22 ENCOUNTER — Ambulatory Visit: Payer: Medicaid Other

## 2022-12-01 ENCOUNTER — Telehealth: Payer: Self-pay

## 2022-12-01 NOTE — Telephone Encounter (Signed)
Called pt. Appt made for 12/10/22 with Dr. Servando Salina.

## 2022-12-10 ENCOUNTER — Encounter: Payer: Self-pay | Admitting: Cardiology

## 2022-12-10 ENCOUNTER — Ambulatory Visit: Payer: Medicaid Other | Admitting: Cardiology

## 2022-12-10 VITALS — BP 132/86 | HR 78 | Ht 65.0 in | Wt 187.0 lb

## 2022-12-10 DIAGNOSIS — Z3A20 20 weeks gestation of pregnancy: Secondary | ICD-10-CM

## 2022-12-10 DIAGNOSIS — I421 Obstructive hypertrophic cardiomyopathy: Secondary | ICD-10-CM

## 2022-12-10 DIAGNOSIS — Z95810 Presence of automatic (implantable) cardiac defibrillator: Secondary | ICD-10-CM

## 2022-12-10 DIAGNOSIS — O0990 Supervision of high risk pregnancy, unspecified, unspecified trimester: Secondary | ICD-10-CM | POA: Diagnosis not present

## 2022-12-10 NOTE — Progress Notes (Unsigned)
Cardio-Obstetrics Clinic  Follow Up Note   Date:  12/10/2022   ID:  [name], DOB 18-Dec-1995, MRN [id number]  PCP:  Loletta Specter, PA-C   [facility] Providers Cardiologist:  None  Electrophysiologist:  Sherryl Manges, MD        Referring MD: Loletta Specter, PA-C   Chief Complaint: I am doing fine  The patient is at home. I am in the office.  Virtual Visit via telephone  Note . I connected with the patient today by a telephone enabled telemedicine application and verified that I am speaking with the correct person using two identifiers.  History of Present Illness:    Sara Lopez is a 27 y.o. female [G2P1001] who returns for follow up of postpartum visit.  Her medical history includes of hypertrophic cardiomyopathy diagnosed as a teenager is now status post Medtronic ICD due to ventricular arrhythmia for secondary prevention with initial ICD placement in 2011 and later change in 2020 she has rerelease follow-up with Dr. 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   I saw the patient on March 17, 2021 at that time she is doing well from a cardiovascular standpoint.  She was in her third trimester pregnancy.     During her visit she was asymptomatic no shortness of breath, no chest pain no lightheadedness no dizziness.  We talked about her delivery plans and all of her questions were answered at that time.     I scheduled the patient for an echocardiogram.  She was able to get her echocardiogram with did not show any resting gradients.  She safely delivered her baby on May 06, 2021.   I saw the patient on 05/25/2021 at that time she was experiencing significant shortness of breath and there was some report on her device interrogation showing fluid overload so I started the patient on diuretics for short while.  And then transition to add a beta-blocker as  well.  She was seen on June 08, 2021 at that time she appeared to be doing well from a cardiovascular standpoint.  We continued her beta-blocker.  Stop the Lasix.  Since I saw the patient she had seen Dr. Graciela Husbands.  And he recommended she continue her metoprolol.  Today she tells me that she has not been taking the metoprolol.  She denies any chest pain or shortness of breath.  She feels that she is at a good baseline.   Prior CV Studies Reviewed: The following studies were reviewed today:  Transthoracic echocardiogram May 26, 2021 IMPRESSIONS     1. There is no significant outflow tract gradient. Left ventricular  ejection fraction, by estimation, is 65 to 70%. The left ventricle has  normal function. The left ventricle has no regional wall motion  abnormalities. There is moderate left ventricular  hypertrophy. Left ventricular diastolic parameters are consistent with  Grade I diastolic dysfunction (impaired relaxation).   2. Right ventricular systolic function is normal. The right ventricular  size is mildly enlarged. There is normal pulmonary artery systolic  pressure. The estimated right ventricular systolic pressure is 23.4 mmHg.   3. Left atrial size was moderately dilated.   4. A small pericardial effusion is present. The pericardial effusion is  circumferential. There is no evidence of cardiac tamponade.   5. Systolic motion of the mitral valve. The mitral valve is normal in  structure. No evidence of mitral valve  regurgitation. No evidence of  mitral stenosis.   6. The aortic valve is normal in structure. Aortic valve regurgitation is  mild. No aortic stenosis is present.   7. Pulmonic valve regurgitation is moderate.   8. The inferior vena cava is normal in size with greater than 50%  respiratory variability, suggesting right atrial pressure of 3 mmHg.   Comparison(s): No significant change from prior study. Prior images  reviewed side by side.    Conclusion(s)/Recommendation(s): Findings consistent with hypertrophic  cardiomyopathy.   FINDINGS   Left Ventricle: There is no significant outflow tract gradient. Left  ventricular ejection fraction, by estimation, is 65 to 70%. The left  ventricle has normal function. The left ventricle has no regional wall  motion abnormalities. The left ventricular  internal cavity size was normal in size. There is moderate left  ventricular hypertrophy. Left ventricular diastolic parameters are  consistent with Grade I diastolic dysfunction (impaired relaxation).   Right Ventricle: The right ventricular size is mildly enlarged. No  increase in right ventricular wall thickness. Right ventricular systolic  function is normal. There is normal pulmonary artery systolic pressure.  The tricuspid regurgitant velocity is 2.26   m/s, and with an assumed right atrial pressure of 3 mmHg, the estimated  right ventricular systolic pressure is 23.4 mmHg.   Left Atrium: Left atrial size was moderately dilated.   Right Atrium: Right atrial size was normal in size.   Pericardium: A small pericardial effusion is present. The pericardial  effusion is circumferential. There is no evidence of cardiac tamponade.   Mitral Valve: Systolic motion of the mitral valve. The mitral valve is  normal in structure. No evidence of mitral valve regurgitation. No  evidence of mitral valve stenosis.   Tricuspid Valve: The tricuspid valve is normal in structure. Tricuspid  valve regurgitation is trivial. No evidence of tricuspid stenosis.   Aortic Valve: The aortic valve is normal in structure. Aortic valve  regurgitation is mild. Aortic regurgitation PHT measures 573 msec. No  aortic stenosis is present.   Pulmonic Valve: The pulmonic valve was normal in structure. Pulmonic valve  regurgitation is moderate. No evidence of pulmonic stenosis.   Aorta: The aortic root is normal in size and structure.   Venous: The  inferior vena cava is normal in size with greater than 50%  respiratory variability, suggesting right atrial pressure of 3 mmHg.   IAS/Shunts: No atrial level shunt detected by color flow Doppler.   Additional Comments: A device lead is visualized in the right ventricle.   Past Medical History:  Diagnosis Date   ADHD    ADHD (attention deficit hyperactivity disorder) 07/21/2011   Cardiac pacemaker in situ 03/27/2018   Coronary-myocardial bridge    LHC at [facility] 2/14: LAD mid myocardial bridge (patent in diastole and near complete occlusion distally) // Myoview 2/14: Inferior attenuation, no ischemia, normal EF   Depression    Development delay    Developmental delay 07/21/2011   History of cardiac arrest 09/21/2016   History of echocardiogram 08/2016   Echo 5/18: severe asymmetric septal hypertrophy, EF 60-65, dynamic obstruction at rest, peak LVOT 328 cm/sec and peak gradient 43 mmHg, no RWMA, septal thickness 24.6 mm, post wall thickness 15.5 mm, +SAM, mild MR, severe LAE, PASP 30   History of echocardiogram 1.2014 at [facility]   Echo 1/14: Hypertrophic CM, mild dynamic LVOT - peak 27 mmHg, chordal SAM with LVOT obstruction, mild MR, dilate LCA, abnormal LV diastolic function, hyperdynamic LV systolic function  History of MI (myocardial infarction) 09/16/2016   HOCM (hypertrophic obstructive cardiomyopathy) (HCC)    ICD (implantable cardioverter-defibrillator) in place    Medtronic model D334DRM (Protect), serial number [id number], implanted 04/30/2010    Myocardial infarction (HCC)    hx of elevated Tn levels // LHC in 2014 at [facility] with myocardial bridging   Postpartum visit 06/09/2021   Red Chart Rounds Patient 12/19/2020   Red Chart Rounds Patient Care Plan     [place]  G1P0  Current GA: [redacted]w[redacted]d          OB History  Gravida  Para  Term  Preterm  AB  Living  1  0  0  0  0  0  SAB  IAB  Ectopic  Multiple  Live Births  0  0  0  0  0        Patient encounter date not specified.         Red Chart Rounds Diagnosis: HOCM s/p remote cardiac arrest with ICD in place, coronary-myocardial bridge     Consultants: Cards,    Single implantable cardioverter-defibrillator (ICD) in situ 07/21/2011   VF (ventricular fibrillation) (HCC)    admitted in 2011 with VF arrest >> dx with HOCM // s/p ICD    Past Surgical History:  Procedure Laterality Date   ICD GENERATOR CHANGEOUT N/A 05/10/2018   Procedure: ICD GENERATOR CHANGEOUT;  Surgeon: Duke Salvia, MD;  Location: [facility] INVASIVE CV LAB;  Service: Cardiovascular;  Laterality: N/A;   ICD IMPLANT  2011      OB History     Gravida  2   Para  1   Term  1   Preterm      AB      Living  1      SAB      IAB      Ectopic      Multiple  0   Live Births  1               Current Medications: Current Meds  Medication Sig   aspirin EC 81 MG tablet Take 1 tablet (81 mg total) by mouth daily. Swallow whole.   Prenatal Vit-Fe Fumarate-FA (MULTIVITAMIN-PRENATAL) 27-0.8 MG TABS tablet Take 1 tablet by mouth daily at 12 noon.     Allergies:   Patient has no known allergies.   Social History   Socioeconomic History   Marital status: Single    Spouse name: Not on file   Number of children: Not on file   Years of education: Not on file   Highest education level: Not on file  Occupational History   Occupation: Conservation officer, nature  Tobacco Use   Smoking status: Former    Current packs/day: 0.00    Types: Cigarettes    Quit date: 07/01/2020    Years since quitting: 2.4   Smokeless tobacco: Former  Building services engineer status: Former   Substances: Nicotine   Devices: not since preg 06/2020  Substance and Sexual Activity   Alcohol use: Not Currently    Alcohol/week: 1.0 standard drink of alcohol    Types: 1 Glasses of wine per week    Comment: not since preg   Drug use: No    Types: Marijuana    Comment: no drugs since 2011   Sexual activity: Not Currently    Birth control/protection: None, Pill  Other Topics Concern    Not on file  Social History Narrative  Trying to get a job with [employer]    Currently working as Conservation officer, 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   Single    No kids   Social Determinants of Corporate investment banker Strain: Not on [employer] Insecurity: No Food Insecurity (04/23/2021)   Hunger Vital Sign    Worried About Running Out of Food in the Last Year: Never true    Ran Out of Food in the Last Year: Never true  Recent Concern: Food Insecurity - Food Insecurity Present (03/11/2021)   Hunger Vital Sign    Worried About Running Out of Food in the Last Year: Sometimes true    Ran Out of Food in the Last Year: Never true  Transportation Needs: No Transportation Needs (04/23/2021)   PRAPARE - Administrator, Civil Service (Medical): No    Lack of Transportation (Non-Medical): No  Physical Activity: Not on file  Stress: Not on file  Social Connections: Not on file      Family History  Adopted: Yes      ROS:   Please see the history of present illness.     All other systems reviewed and are negative.   Labs/EKG Reviewed:    EKG:   EKG is was not ordered today.    Recent Labs: 11/16/2022: Hemoglobin 12.6; Platelets 199   Recent Lipid Panel No results found for: "CHOL", "TRIG", "HDL", "CHOLHDL", "LDLCALC", "LDLDIRECT"  Physical Exam:    VS:  BP 132/86   Pulse 78   Ht 5\' 5"  (1.651 m)   Wt 187 lb (84.8 kg)   SpO2 96%   BMI 31.12 kg/m     Wt Readings from Last 3 Encounters:  12/10/22 187 lb (84.8 kg)  11/16/22 178 lb 12.8 oz (81.1 kg)  11/09/22 180 lb 9.6 oz (81.9 kg)   Virtual visit no physical exam performed.   Risk Assessment/Risk Calculators:           { Click for CHADS2VASc Score - THEN Refresh Note    :[id number]}      ASSESSMENT & PLAN:    Postpartum cardiovascular visit. Hypertrophic obstructive cardiomyopathy History of cardiac arrest ICD in situ-Medtronics    There are no Patient Instructions on file for this  visit.   Dispo:  No follow-ups on file.   Medication Adjustments/Labs and Tests Ordered: Current medicines are reviewed at length with the patient today.  Concerns regarding medicines are outlined above.  Tests Ordered: Orders Placed This Encounter  Procedures   EKG 12-Lead   Medication Changes: No orders of the defined types were placed in this encounter.

## 2022-12-10 NOTE — Patient Instructions (Signed)
Medication Instructions:  Your physician recommends that you continue on your current medications as directed. Please refer to the Current Medication list given to you today.  *If you need a refill on your cardiac medications before your next appointment, please call your pharmacy*   Lab Work: None   Testing/Procedures: None   Follow-Up: At [facility], you and your health needs are our priority.  As part of our continuing mission to provide you with exceptional heart care, we have created designated Provider Care Teams.  These Care Teams include your primary Cardiologist (physician) and Advanced Practice Providers (APPs -  Physician Assistants and Nurse Practitioners) who all work together to provide you with the care you need, when you need it.   Your next appointment:   12 week(s)  Provider:   Thomasene Ripple, DO   [facility] or [facility]

## 2022-12-10 NOTE — Progress Notes (Unsigned)
Cardio-Obstetrics Clinic  Follow Up Note   Date:  12/10/2022   ID:  Sara Lopez, DOB 07-18-1995, MRN [id number]  PCP:  Denny Levy   [facility] Providers Cardiologist:  None  Electrophysiologist:  Sherryl Manges, MD   { Click to update primary MD,subspecialty MD or APP then REFRESH:1}     Referring MD: Loletta Specter, PA-C   Chief Complaint: ***  History of Present Illness:    Sara Lopez is a 27 y.o. female [G2P1001] who returns for follow up of ***   Prior CV Studies Reviewed: The following studies were reviewed today: ***  Past Medical History:  Diagnosis Date   ADHD    ADHD (attention deficit hyperactivity disorder) 07/21/2011   Cardiac pacemaker in situ 03/27/2018   Coronary-myocardial bridge    LHC at [facility] 2/14: LAD mid myocardial bridge (patent in diastole and near complete occlusion distally) // Myoview 2/14: Inferior attenuation, no ischemia, normal EF   Depression    Development delay    Developmental delay 07/21/2011   History of cardiac arrest 09/21/2016   History of echocardiogram 08/2016   Echo 5/18: severe asymmetric septal hypertrophy, EF 60-65, dynamic obstruction at rest, peak LVOT 328 cm/sec and peak gradient 43 mmHg, no RWMA, septal thickness 24.6 mm, post wall thickness 15.5 mm, +SAM, mild MR, severe LAE, PASP 30   History of echocardiogram 1.2014 at [facility]   Echo 1/14: Hypertrophic CM, mild dynamic LVOT - peak 27 mmHg, chordal SAM with LVOT obstruction, mild MR, dilate LCA, abnormal LV diastolic function, hyperdynamic LV systolic function    History of MI (myocardial infarction) 09/16/2016   HOCM (hypertrophic obstructive cardiomyopathy) (HCC)    ICD (implantable cardioverter-defibrillator) in place    Medtronic model D334DRM (Protect), serial number [id number], implanted 04/30/2010    Myocardial infarction (HCC)    hx of elevated Tn levels // LHC in 2014 at [facility] with myocardial bridging   Postpartum visit  06/09/2021   Red Chart Rounds Patient 12/19/2020   Red Chart Rounds Patient Care Plan     Sara Lopez  G1P0  Current GA: [redacted]w[redacted]d          OB History  Gravida  Para  Term  Preterm  AB  Living  1  0  0  0  0  0  SAB  IAB  Ectopic  Multiple  Live Births  0  0  0  0  0        Patient encounter date not specified.        Red Chart Rounds Diagnosis: HOCM s/p remote cardiac arrest with ICD in place, coronary-myocardial bridge     Consultants: Cards,    Single implantable cardioverter-defibrillator (ICD) in situ 07/21/2011   VF (ventricular fibrillation) (HCC)    admitted in 2011 with VF arrest >> dx with HOCM // s/p ICD    Past Surgical History:  Procedure Laterality Date   ICD GENERATOR CHANGEOUT N/A 05/10/2018   Procedure: ICD GENERATOR CHANGEOUT;  Surgeon: Duke Salvia, MD;  Location: [facility] INVASIVE CV LAB;  Service: Cardiovascular;  Laterality: N/A;   ICD IMPLANT  2011   { Click here to update PMH, PSH, OB Hx then refresh note  :1}   OB History     Gravida  2   Para  1   Term  1   Preterm      AB      Living  1  SAB      IAB      Ectopic      Multiple  0   Live Births  1           { Click here to update OB Charting then refresh note  :1}    Current Medications: Current Meds  Medication Sig   aspirin EC 81 MG tablet Take 1 tablet (81 mg total) by mouth daily. Swallow whole.   Prenatal Vit-Fe Fumarate-FA (MULTIVITAMIN-PRENATAL) 27-0.8 MG TABS tablet Take 1 tablet by mouth daily at 12 noon.     Allergies:   Patient has no known allergies.   Social History   Socioeconomic History   Marital status: Single    Spouse name: Not on file   Number of children: Not on file   Years of education: Not on file   Highest education level: Not on file  Occupational History   Occupation: Conservation officer, nature  Tobacco Use   Smoking status: Former    Current packs/day: 0.00    Types: Cigarettes    Quit date: 07/01/2020    Years since quitting: 2.4   Smokeless tobacco: Former   Building services engineer status: Former   Substances: Nicotine   Devices: not since preg 06/2020  Substance and Sexual Activity   Alcohol use: Not Currently    Alcohol/week: 1.0 standard drink of alcohol    Types: 1 Glasses of wine per week    Comment: not since preg   Drug use: No    Types: Marijuana    Comment: no drugs since 2011   Sexual activity: Not Currently    Birth control/protection: None, Pill  Other Topics Concern   Not on file  Social History Narrative   Trying to get a job with [employer]    Currently working as Conservation officer, 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   Single    No kids   Social Determinants of Corporate investment banker Strain: Not on file  Food Insecurity: No Food Insecurity (04/23/2021)   Hunger Vital Sign    Worried About Running Out of Food in the Last Year: Never true    Ran Out of Food in the Last Year: Never true  Recent Concern: Food Insecurity - Food Insecurity Present (03/11/2021)   Hunger Vital Sign    Worried About Running Out of Food in the Last Year: Sometimes true    Ran Out of Food in the Last Year: Never true  Transportation Needs: No Transportation Needs (04/23/2021)   PRAPARE - Administrator, Civil Service (Medical): No    Lack of Transportation (Non-Medical): No  Physical Activity: Not on file  Stress: Not on file  Social Connections: Not on file  { Click here to update SDOH then refresh :1}    Family History  Adopted: Yes   { Click here to update FH then refresh note    :1}   ROS:   Please see the history of present illness.    *** All other systems reviewed and are negative.   Labs/EKG Reviewed:    EKG:   EKG is *** ordered today.  The ekg ordered today demonstrates ***  Recent Labs: 11/16/2022: Hemoglobin 12.6; Platelets 199   Recent Lipid Panel No results found for: "CHOL", "TRIG", "HDL", "CHOLHDL", "LDLCALC", "LDLDIRECT"  Physical Exam:    VS:  BP 132/86   Pulse 78   Ht 5\' 5"  (1.651 m)   Wt  187 lb (84.8 kg)  SpO2 96%   BMI 31.12 kg/m     Wt Readings from Last 3 Encounters:  12/10/22 187 lb (84.8 kg)  11/16/22 178 lb 12.8 oz (81.1 kg)  11/09/22 180 lb 9.6 oz (81.9 kg)     GEN: *** Well nourished, well developed in no acute distress HEENT: Normal NECK: No JVD; No carotid bruits LYMPHATICS: No lymphadenopathy CARDIAC: ***RRR, no murmurs, rubs, gallops RESPIRATORY:  Clear to auscultation without rales, wheezing or rhonchi  ABDOMEN: Soft, non-tender, non-distended MUSCULOSKELETAL:  No edema; No deformity  SKIN: Warm and dry NEUROLOGIC:  Alert and oriented x 3 PSYCHIATRIC:  Normal affect    Risk Assessment/Risk Calculators:   { Click to calculate CARPREG II - THEN refresh note :1}    { Click to caclulate Mod WHO Class of CV Risk - THEN refresh note :1}     { Click for CHADS2VASc Score - THEN Refresh Note    :[id number]}      ASSESSMENT & PLAN:    *** There are no Patient Instructions on file for this visit.   Dispo:  No follow-ups on file.   Medication Adjustments/Labs and Tests Ordered: Current medicines are reviewed at length with the patient today.  Concerns regarding medicines are outlined above.  Tests Ordered: Orders Placed This Encounter  Procedures   EKG 12-Lead   Medication Changes: No orders of the defined types were placed in this encounter.

## 2022-12-14 ENCOUNTER — Encounter: Payer: Self-pay | Admitting: *Deleted

## 2022-12-14 ENCOUNTER — Other Ambulatory Visit: Payer: Self-pay

## 2022-12-14 ENCOUNTER — Encounter: Payer: Self-pay | Admitting: Obstetrics and Gynecology

## 2022-12-14 ENCOUNTER — Ambulatory Visit: Payer: Medicaid Other | Attending: Obstetrics and Gynecology

## 2022-12-14 ENCOUNTER — Ambulatory Visit: Payer: Medicaid Other | Admitting: Obstetrics and Gynecology

## 2022-12-14 ENCOUNTER — Ambulatory Visit: Payer: Medicaid Other | Admitting: *Deleted

## 2022-12-14 ENCOUNTER — Other Ambulatory Visit: Payer: Self-pay | Admitting: *Deleted

## 2022-12-14 VITALS — BP 103/60 | HR 57

## 2022-12-14 VITALS — BP 103/60 | Wt 190.1 lb

## 2022-12-14 DIAGNOSIS — Z950 Presence of cardiac pacemaker: Secondary | ICD-10-CM | POA: Insufficient documentation

## 2022-12-14 DIAGNOSIS — I252 Old myocardial infarction: Secondary | ICD-10-CM | POA: Diagnosis not present

## 2022-12-14 DIAGNOSIS — O0990 Supervision of high risk pregnancy, unspecified, unspecified trimester: Secondary | ICD-10-CM

## 2022-12-14 DIAGNOSIS — O99892 Other specified diseases and conditions complicating childbirth: Secondary | ICD-10-CM | POA: Diagnosis not present

## 2022-12-14 DIAGNOSIS — Z3A20 20 weeks gestation of pregnancy: Secondary | ICD-10-CM | POA: Diagnosis not present

## 2022-12-14 DIAGNOSIS — Z3009 Encounter for other general counseling and advice on contraception: Secondary | ICD-10-CM

## 2022-12-14 DIAGNOSIS — O99412 Diseases of the circulatory system complicating pregnancy, second trimester: Secondary | ICD-10-CM

## 2022-12-14 DIAGNOSIS — Z2839 Other underimmunization status: Secondary | ICD-10-CM

## 2022-12-14 DIAGNOSIS — O09892 Supervision of other high risk pregnancies, second trimester: Secondary | ICD-10-CM

## 2022-12-14 DIAGNOSIS — I4901 Ventricular fibrillation: Secondary | ICD-10-CM

## 2022-12-14 DIAGNOSIS — Q245 Malformation of coronary vessels: Secondary | ICD-10-CM | POA: Insufficient documentation

## 2022-12-14 DIAGNOSIS — O0992 Supervision of high risk pregnancy, unspecified, second trimester: Secondary | ICD-10-CM

## 2022-12-14 DIAGNOSIS — I421 Obstructive hypertrophic cardiomyopathy: Secondary | ICD-10-CM | POA: Insufficient documentation

## 2022-12-14 DIAGNOSIS — O99419 Diseases of the circulatory system complicating pregnancy, unspecified trimester: Secondary | ICD-10-CM

## 2022-12-14 NOTE — Patient Instructions (Signed)

## 2022-12-14 NOTE — Progress Notes (Signed)
Subjective:  Sara Lopez is a 27 y.o. G2P1001 at 106w6d being seen today for ongoing prenatal care.  She is currently monitored for the following issues for this high-risk pregnancy and has HOCM (hypertrophic obstructive cardiomyopathy) (HCC); Coronary-myocardial bridge; Cardiac pacemaker in situ; Heart disease; VF (ventricular fibrillation) (HCC); Supervision of high risk pregnancy, antepartum; Unwanted fertility; and Rubella non-immune status, antepartum on their problem list.  Patient reports no complaints.  Contractions: Not present. Vag. Bleeding: None.  Movement: Present. Denies leaking of fluid.   The following portions of the patient's history were reviewed and updated as appropriate: allergies, current medications, past family history, past medical history, past social history, past surgical history and problem list. Problem list updated.  Objective:   Vitals:   12/14/22 1551  BP: 103/60  Weight: 190 lb 1.6 oz (86.2 kg)    Fetal Status: Fetal Heart Rate (bpm): 155   Movement: Present     General:  Alert, oriented and cooperative. Patient is in no acute distress.  Skin: Skin is warm and dry. No rash noted.   Cardiovascular: Normal heart rate noted  Respiratory: Normal respiratory effort, no problems with respiration noted  Abdomen: Soft, gravid, appropriate for gestational age. Pain/Pressure: Absent     Pelvic:  Cervical exam deferred        Extremities: Normal range of motion.  Edema: Trace  Mental Status: Normal mood and affect. Normal behavior. Normal judgment and thought content.   Urinalysis:      Assessment and Plan:  Pregnancy: G2P1001 at [redacted]w[redacted]d  1. Supervision of high risk pregnancy, antepartum Stable Anatomy scan today  2. Rubella non-immune status, antepartum Vaccine PP  3. HOCM (hypertrophic obstructive cardiomyopathy) (HCC) Stable Has seen OB card's and has follow up appt - US Fetal Echocardiography; Future  4. VF (ventricular fibrillation)  (HCC) Has appt with EP cards - US Fetal Echocardiography; Future  5. Coronary-myocardial bridge See above - US Fetal Echocardiography; Future  6. Unwanted fertility BTL papers at 28 weeks  Preterm labor symptoms and general obstetric precautions including but not limited to vaginal bleeding, contractions, leaking of fluid and fetal movement were reviewed in detail with the patient. Please refer to After Visit Summary for other counseling recommendations.  Return in about 4 weeks (around 01/11/2023) for OB visit, face to face, MD only.   Hermina Staggers, MD

## 2023-01-05 DIAGNOSIS — Z95810 Presence of automatic (implantable) cardiac defibrillator: Secondary | ICD-10-CM | POA: Insufficient documentation

## 2023-01-06 ENCOUNTER — Encounter: Payer: Self-pay | Admitting: Internal Medicine

## 2023-01-06 ENCOUNTER — Ambulatory Visit: Payer: Medicaid Other | Attending: Internal Medicine | Admitting: Internal Medicine

## 2023-01-06 VITALS — BP 98/66 | HR 70 | Ht 65.0 in | Wt 198.2 lb

## 2023-01-06 DIAGNOSIS — Z95810 Presence of automatic (implantable) cardiac defibrillator: Secondary | ICD-10-CM | POA: Diagnosis not present

## 2023-01-06 DIAGNOSIS — I4901 Ventricular fibrillation: Secondary | ICD-10-CM | POA: Diagnosis not present

## 2023-01-06 DIAGNOSIS — I421 Obstructive hypertrophic cardiomyopathy: Secondary | ICD-10-CM

## 2023-01-06 NOTE — Patient Instructions (Signed)
Medication Instructions:  Your physician recommends that you continue on your current medications as directed. Please refer to the Current Medication list given to you today.  *If you need a refill on your cardiac medications before your next appointment, please call your pharmacy*   Lab Work: None ordered.  If you have labs (blood work) drawn today and your tests are completely normal, you will receive your results only by: MyChart Message (if you have MyChart) OR A paper copy in the mail If you have any lab test that is abnormal or we need to change your treatment, we will call you to review the results.   Testing/Procedures: None ordered.    Follow-Up: At [facility], you and your health needs are our priority.  As part of our continuing mission to provide you with exceptional heart care, we have created designated Provider Care Teams.  These Care Teams include your primary Cardiologist (physician) and Advanced Practice Providers (APPs -  Physician Assistants and Nurse Practitioners) who all work together to provide you with the care you need, when you need it.  We recommend signing up for the patient portal called "MyChart".  Sign up information is provided on this After Visit Summary.  MyChart is used to connect with patients for Virtual Visits (Telemedicine).  Patients are able to view lab/test results, encounter notes, upcoming appointments, etc.  Non-urgent messages can be sent to your provider as well.   To learn more about what you can do with MyChart, go to [email or web address].    Your next appointment:   12 months with Dr Michele Rockers

## 2023-01-06 NOTE — Progress Notes (Signed)
Patient Care Team: Denny Levy as PCP - General (Physician Assistant) Duke Salvia, MD as PCP - Electrophysiology (Cardiology) Thomasene Ripple, DO as PCP - Cardiology (Cardiology)   HPI  Sara Lopez is a 27 y.o. female seen in follow-up for an ICD implanted for secondary prevention in the setting of hypertrophic obstructive cardiomyopathy.  Initial device implant 2011 and generator replacement 2020  No interval events.  She recently delivered a girl, 1/23.  Sara Lopez.  She is pregnant again and her son is due in December.  A lot of heartburn.  No dyspnea no palpitations     Records and Results Reviewed  Past Medical History:  Diagnosis Date   ADHD    ADHD (attention deficit hyperactivity disorder) 07/21/2011   Cardiac pacemaker in situ 03/27/2018   Coronary-myocardial bridge    LHC at [facility] 2/14: LAD mid myocardial bridge (patent in diastole and near complete occlusion distally) // Myoview 2/14: Inferior attenuation, no ischemia, normal EF   Depression    Development delay    Developmental delay 07/21/2011   History of cardiac arrest 09/21/2016   History of echocardiogram 08/2016   Echo 5/18: severe asymmetric septal hypertrophy, EF 60-65, dynamic obstruction at rest, peak LVOT 328 cm/sec and peak gradient 43 mmHg, no RWMA, septal thickness 24.6 mm, post wall thickness 15.5 mm, +SAM, mild MR, severe LAE, PASP 30   History of echocardiogram 1.2014 at [facility]   Echo 1/14: Hypertrophic CM, mild dynamic LVOT - peak 27 mmHg, chordal SAM with LVOT obstruction, mild MR, dilate LCA, abnormal LV diastolic function, hyperdynamic LV systolic function    History of MI (myocardial infarction) 09/16/2016   HOCM (hypertrophic obstructive cardiomyopathy) (HCC)    ICD (implantable cardioverter-defibrillator) in place    Medtronic model D334DRM (Protect), serial number [id number], implanted 04/30/2010    Myocardial infarction (HCC)    hx of elevated Tn levels // LHC  in 2014 at [facility] with myocardial bridging   Postpartum visit 06/09/2021   Red Chart Rounds Patient 12/19/2020   Red Chart Rounds Patient Care Plan     Sara Lopez  G1P0  Current GA: [redacted]w[redacted]d          OB History  Gravida  Para  Term  Preterm  AB  Living  1  0  0  0  0  0  SAB  IAB  Ectopic  Multiple  Live Births  0  0  0  0  0        Patient encounter date not specified.        Red Chart Rounds Diagnosis: HOCM s/p remote cardiac arrest with ICD in place, coronary-myocardial bridge     Consultants: Cards,    Single implantable cardioverter-defibrillator (ICD) in situ 07/21/2011   VF (ventricular fibrillation) (HCC)    admitted in 2011 with VF arrest >> dx with HOCM // s/p ICD    Past Surgical History:  Procedure Laterality Date   ICD GENERATOR CHANGEOUT N/A 05/10/2018   Procedure: ICD GENERATOR CHANGEOUT;  Surgeon: Duke Salvia, MD;  Location: [facility] INVASIVE CV LAB;  Service: Cardiovascular;  Laterality: N/A;   ICD IMPLANT  2011    Current Meds  Medication Sig   Prenatal Vit-Fe Fumarate-FA (MULTIVITAMIN-PRENATAL) 27-0.8 MG TABS tablet Take 1 tablet by mouth daily at 12 noon.    No Known Allergies    Review of Systems negative except from HPI and PMH  Physical Exam BP 98/66  Pulse 70   Ht 5\' 5"  (1.651 m)   Wt 198 lb 3.2 oz (89.9 kg)   SpO2 98%   BMI 32.98 kg/m  Well developed and well nourished in no acute distress HENT normal Neck supple with JVP-flat Clear Device pocket well healed; without hematoma or erythema.  There is no tethering  Regular rate and rhythm, no  gallop No  murmur Abd-soft with active BS No Clubbing cyanosis  edema Skin-warm and dry A & Oriented  Grossly normal sensory and motor function  ECG sinus @ 90  15/08/36 On the rhythm strip had PVCs with a right bundle superior axis morphology Device function is normal. Programming changes   See Paceart for details    CrCl cannot be calculated (Patient's most recent lab result is older than the maximum  21 days allowed.).   Assessment and  Plan HOCM   Cardiac Arrest Resuscitated   ICD - Medtronic   Tachypalpitations   RV capture alert  PVCs   No no interval sustained arrhythmias.   Not on a beta-blocker currently.  I have reached out to Dr. Servando Salina, suggesting that we follow the PVCs during pregnancy as they are asymptomatic and monitor for their persistence following delivery as she has had a fair amount of reflux and I wonder whether there are gastrointestinal cardiac interactions

## 2023-01-07 ENCOUNTER — Other Ambulatory Visit: Payer: Self-pay

## 2023-01-07 ENCOUNTER — Telehealth: Payer: Self-pay

## 2023-01-07 MED ORDER — METOPROLOL SUCCINATE ER 25 MG PO TB24: 12.5000 mg | ORAL_TABLET | Freq: Every day | ORAL | 3 refills | Status: DC

## 2023-01-07 NOTE — Telephone Encounter (Signed)
Spoke with Dr. Servando Salina. Per her and Dr. Graciela Husbands pt should start taking Metoprolol succinate (Toprol-XL) 12.5 mg (half tablet) once daily. Pt made aware of this and is agreeable to start the medication. Prescription sent to pharmacy. Pt does not have any questions at this time.

## 2023-01-07 NOTE — Progress Notes (Signed)
Spoke with Dr. Servando Salina. Per her and Dr. Graciela Husbands pt should start taking Metoprolol succinate (Toprol-XL) 12.5 mg (half tablet) once daily. Pt made aware of this and is agreeable to start the medication. Prescription sent to pharmacy. Pt does not have any questions at this time.

## 2023-01-11 ENCOUNTER — Ambulatory Visit: Payer: Medicaid Other | Attending: Obstetrics and Gynecology

## 2023-01-11 ENCOUNTER — Encounter: Payer: Self-pay | Admitting: *Deleted

## 2023-01-11 ENCOUNTER — Ambulatory Visit: Payer: Medicaid Other | Admitting: *Deleted

## 2023-01-11 VITALS — BP 103/60 | HR 72

## 2023-01-11 DIAGNOSIS — Z3A24 24 weeks gestation of pregnancy: Secondary | ICD-10-CM

## 2023-01-11 DIAGNOSIS — O99892 Other specified diseases and conditions complicating childbirth: Secondary | ICD-10-CM

## 2023-01-11 DIAGNOSIS — O0990 Supervision of high risk pregnancy, unspecified, unspecified trimester: Secondary | ICD-10-CM | POA: Diagnosis not present

## 2023-01-11 DIAGNOSIS — Q249 Congenital malformation of heart, unspecified: Secondary | ICD-10-CM | POA: Diagnosis not present

## 2023-01-11 DIAGNOSIS — O352XX0 Maternal care for (suspected) hereditary disease in fetus, not applicable or unspecified: Secondary | ICD-10-CM | POA: Diagnosis not present

## 2023-01-11 DIAGNOSIS — O99419 Diseases of the circulatory system complicating pregnancy, unspecified trimester: Secondary | ICD-10-CM | POA: Diagnosis not present

## 2023-01-12 ENCOUNTER — Other Ambulatory Visit: Payer: Self-pay | Admitting: *Deleted

## 2023-01-12 DIAGNOSIS — O283 Abnormal ultrasonic finding on antenatal screening of mother: Secondary | ICD-10-CM

## 2023-01-13 ENCOUNTER — Ambulatory Visit (INDEPENDENT_AMBULATORY_CARE_PROVIDER_SITE_OTHER): Payer: Medicaid Other | Admitting: Obstetrics and Gynecology

## 2023-01-13 ENCOUNTER — Other Ambulatory Visit: Payer: Self-pay

## 2023-01-13 ENCOUNTER — Encounter: Payer: Self-pay | Admitting: Obstetrics and Gynecology

## 2023-01-13 VITALS — BP 110/78 | HR 77 | Wt 198.6 lb

## 2023-01-13 DIAGNOSIS — Q245 Malformation of coronary vessels: Secondary | ICD-10-CM

## 2023-01-13 DIAGNOSIS — I421 Obstructive hypertrophic cardiomyopathy: Secondary | ICD-10-CM

## 2023-01-13 DIAGNOSIS — Z2839 Other underimmunization status: Secondary | ICD-10-CM

## 2023-01-13 DIAGNOSIS — O09892 Supervision of other high risk pregnancies, second trimester: Secondary | ICD-10-CM

## 2023-01-13 DIAGNOSIS — Z3A25 25 weeks gestation of pregnancy: Secondary | ICD-10-CM

## 2023-01-13 DIAGNOSIS — O0990 Supervision of high risk pregnancy, unspecified, unspecified trimester: Secondary | ICD-10-CM

## 2023-01-13 DIAGNOSIS — Z3009 Encounter for other general counseling and advice on contraception: Secondary | ICD-10-CM

## 2023-01-13 DIAGNOSIS — Z8249 Family history of ischemic heart disease and other diseases of the circulatory system: Secondary | ICD-10-CM | POA: Diagnosis not present

## 2023-01-13 DIAGNOSIS — Z95810 Presence of automatic (implantable) cardiac defibrillator: Secondary | ICD-10-CM

## 2023-01-13 DIAGNOSIS — O09899 Supervision of other high risk pregnancies, unspecified trimester: Secondary | ICD-10-CM

## 2023-01-13 DIAGNOSIS — Z950 Presence of cardiac pacemaker: Secondary | ICD-10-CM

## 2023-01-13 NOTE — Progress Notes (Signed)
Subjective:  Sara Lopez is a 27 y.o. G2P1001 at [redacted]w[redacted]d being seen today for ongoing prenatal care.  She is currently monitored for the following issues for this low-risk pregnancy and has HOCM (hypertrophic obstructive cardiomyopathy) (HCC); Coronary-myocardial bridge; Cardiac pacemaker in situ; Heart disease; VF (ventricular fibrillation) (HCC); Supervision of high risk pregnancy, antepartum; Unwanted fertility; Rubella non-immune status, antepartum; and ICD (implantable cardioverter-defibrillator) in place - MDT on their problem list.  Patient reports  general discomforts of pregnancy .  Contractions: Irritability. Vag. Bleeding: None.  Movement: Present. Denies leaking of fluid.   The following portions of the patient's history were reviewed and updated as appropriate: allergies, current medications, past family history, past medical history, past social history, past surgical history and problem list. Problem list updated.  Objective:   Vitals:   01/13/23 1533  BP: 110/78  Pulse: 77  Weight: 198 lb 9.6 oz (90.1 kg)    Fetal Status: Fetal Heart Rate (bpm): 153   Movement: Present     General:  Alert, oriented and cooperative. Patient is in no acute distress.  Skin: Skin is warm and dry. No rash noted.   Cardiovascular: Normal heart rate noted  Respiratory: Normal respiratory effort, no problems with respiration noted  Abdomen: Soft, gravid, appropriate for gestational age. Pain/Pressure: Present     Pelvic:  Cervical exam deferred        Extremities: Normal range of motion.  Edema: None  Mental Status: Normal mood and affect. Normal behavior. Normal judgment and thought content.   Urinalysis:      Assessment and Plan:  Pregnancy: G2P1001 at [redacted]w[redacted]d  1. Supervision of high risk pregnancy, antepartum Stable Glucola next visit  2. Rubella non-immune status, antepartum Vaccine PP  3. ICD (implantable cardioverter-defibrillator) in place - MDT Has seen EP cards Stable F/U  with them in 1 yr  4. Cardiac pacemaker in situ See above  5. Coronary-myocardial bridge See above  6. HOCM (hypertrophic obstructive cardiomyopathy) (HCC) Fetal ECHO today Followed by OB cards  7. Unwanted fertility BTL papers at next visit  Preterm labor symptoms and general obstetric precautions including but not limited to vaginal bleeding, contractions, leaking of fluid and fetal movement were reviewed in detail with the patient. Please refer to After Visit Summary for other counseling recommendations.  Return in about 3 weeks (around 02/03/2023) for Fasting for Glucola, OB visit, face to face, MD only.   Hermina Staggers, MD

## 2023-01-31 ENCOUNTER — Other Ambulatory Visit: Payer: Self-pay

## 2023-01-31 DIAGNOSIS — O0990 Supervision of high risk pregnancy, unspecified, unspecified trimester: Secondary | ICD-10-CM

## 2023-02-03 ENCOUNTER — Other Ambulatory Visit: Payer: Medicaid Other

## 2023-02-03 ENCOUNTER — Other Ambulatory Visit: Payer: Self-pay

## 2023-02-03 ENCOUNTER — Ambulatory Visit (INDEPENDENT_AMBULATORY_CARE_PROVIDER_SITE_OTHER): Payer: Medicaid Other | Admitting: Obstetrics & Gynecology

## 2023-02-03 VITALS — BP 105/70 | HR 76 | Wt 205.1 lb

## 2023-02-03 DIAGNOSIS — O0993 Supervision of high risk pregnancy, unspecified, third trimester: Secondary | ICD-10-CM

## 2023-02-03 DIAGNOSIS — O0990 Supervision of high risk pregnancy, unspecified, unspecified trimester: Secondary | ICD-10-CM

## 2023-02-03 DIAGNOSIS — I4901 Ventricular fibrillation: Secondary | ICD-10-CM

## 2023-02-03 DIAGNOSIS — Z95810 Presence of automatic (implantable) cardiac defibrillator: Secondary | ICD-10-CM

## 2023-02-03 DIAGNOSIS — I519 Heart disease, unspecified: Secondary | ICD-10-CM

## 2023-02-03 DIAGNOSIS — Z3A28 28 weeks gestation of pregnancy: Secondary | ICD-10-CM

## 2023-02-03 MED ORDER — PANTOPRAZOLE SODIUM 20 MG PO TBEC
20.0000 mg | DELAYED_RELEASE_TABLET | Freq: Every day | ORAL | 22 refills | Status: DC
Start: 2023-02-03 — End: 2023-04-23

## 2023-02-03 NOTE — Progress Notes (Signed)
   PRENATAL VISIT NOTE  Subjective:  Sara Lopez is a 27 y.o. G2P1001 at [redacted]w[redacted]d being seen today for ongoing prenatal care.  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  Patient reports heartburn and nausea.  Contractions: Irritability. Vag. Bleeding: None.  Movement: Present. Denies leaking of fluid.   The following portions of the patient's history were reviewed and updated as appropriate: allergies, current medications, past family history, past medical history, past social history, past surgical history and problem list.   Objective:   Vitals:   02/03/23 0824  BP: 105/70  Pulse: 76  Weight: 205 lb 1.6 oz (93 kg)    Fetal Status: Fetal Heart Rate (bpm): 145   Movement: Present     General:  Alert, oriented and cooperative. Patient is in no acute distress.  Skin: Skin is warm and dry. No rash noted.   Cardiovascular: Normal heart rate noted  Respiratory: Normal respiratory effort, no problems with respiration noted  Abdomen: Soft, gravid, appropriate for gestational age.  Pain/Pressure: Present     Pelvic: Cervical exam deferred        Extremities: Normal range of motion.  Edema: None  Mental Status: Normal mood and affect. Normal behavior. Normal judgment and thought content.   Assessment and Plan:  Pregnancy: G2P1001 at [redacted]w[redacted]d 1. Heart disease Followed by cardiology  2. VF (ventricular fibrillation) (HCC)   3. Supervision of high risk pregnancy, antepartum For reflux and nausea - pantoprazole (PROTONIX) 20 MG tablet; Take 1 tablet (20 mg total) by mouth daily.  Dispense: 30  tablet; Refill: 22  4. ICD (implantable cardioverter-defibrillator) in place - MDT   Preterm labor symptoms and general obstetric precautions including but not limited to vaginal bleeding, contractions, leaking of fluid and fetal movement were reviewed in detail with the patient. Please refer to After Visit Summary for other counseling recommendations.   Return in about 3 weeks (around 02/24/2023).  Future Appointments  Date Time Provider Department Center  02/03/2023  8:55 AM Adam Phenix, MD [facility]  02/15/2023 10:20 AM Thomasene Ripple, DO [facility] None  02/15/2023  2:15 PM [facility] NURSE [facility]  02/15/2023  2:30 PM [facility] US1 [facility]  02/21/2023  8:10 AM [facility] DEVICE REMOTES [facility]  02/23/2023  2:20 PM Tobb, Kardie, DO [facility] None  05/23/2023  8:10 AM [facility] DEVICE REMOTES [facility]  08/22/2023  8:10 AM [facility] DEVICE REMOTES [facility]  11/21/2023  8:10 AM [facility] DEVICE REMOTES [facility]  02/20/2024  8:10 AM [facility] DEVICE REMOTES [facility]  05/21/2024  8:10 AM [facility] DEVICE REMOTES [facility]  08/20/2024  8:10 AM [facility] DEVICE REMOTES [facility]    Scheryl Darter, MD

## 2023-02-04 LAB — CBC
Hematocrit: 35.7 % (ref 34.0–46.6)
Hemoglobin: 11.5 g/dL (ref 11.1–15.9)
MCH: 29.6 pg (ref 26.6–33.0)
MCHC: 32.2 g/dL (ref 31.5–35.7)
MCV: 92 fL (ref 79–97)
Platelets: 188 10*3/uL (ref 150–450)
RBC: 3.89 x10E6/uL (ref 3.77–5.28)
RDW: 11.7 % (ref 11.7–15.4)
WBC: 5.8 10*3/uL (ref 3.4–10.8)

## 2023-02-04 LAB — GLUCOSE TOLERANCE, 2 HOURS W/ 1HR
Glucose, 1 hour: 135 mg/dL (ref 70–179)
Glucose, 2 hour: 82 mg/dL (ref 70–152)
Glucose, Fasting: 78 mg/dL (ref 70–91)

## 2023-02-04 LAB — HIV ANTIBODY (ROUTINE TESTING W REFLEX): HIV Screen 4th Generation wRfx: NONREACTIVE

## 2023-02-04 LAB — RPR: RPR Ser Ql: NONREACTIVE

## 2023-02-15 ENCOUNTER — Encounter: Payer: Self-pay | Admitting: Cardiology

## 2023-02-15 ENCOUNTER — Encounter: Payer: Self-pay | Admitting: *Deleted

## 2023-02-15 ENCOUNTER — Other Ambulatory Visit: Payer: Self-pay

## 2023-02-15 ENCOUNTER — Ambulatory Visit: Payer: Medicaid Other | Attending: Cardiology

## 2023-02-15 ENCOUNTER — Ambulatory Visit: Payer: Medicaid Other | Admitting: Cardiology

## 2023-02-15 ENCOUNTER — Ambulatory Visit: Payer: Medicaid Other | Admitting: *Deleted

## 2023-02-15 VITALS — BP 115/67 | HR 80

## 2023-02-15 VITALS — BP 101/61 | HR 87 | Ht 65.0 in | Wt 209.4 lb

## 2023-02-15 DIAGNOSIS — Z3A30 30 weeks gestation of pregnancy: Secondary | ICD-10-CM

## 2023-02-15 DIAGNOSIS — O0990 Supervision of high risk pregnancy, unspecified, unspecified trimester: Secondary | ICD-10-CM

## 2023-02-15 DIAGNOSIS — I4901 Ventricular fibrillation: Secondary | ICD-10-CM | POA: Diagnosis not present

## 2023-02-15 DIAGNOSIS — I493 Ventricular premature depolarization: Secondary | ICD-10-CM | POA: Diagnosis not present

## 2023-02-15 DIAGNOSIS — I421 Obstructive hypertrophic cardiomyopathy: Secondary | ICD-10-CM

## 2023-02-15 DIAGNOSIS — O99413 Diseases of the circulatory system complicating pregnancy, third trimester: Secondary | ICD-10-CM

## 2023-02-15 DIAGNOSIS — Q245 Malformation of coronary vessels: Secondary | ICD-10-CM | POA: Diagnosis not present

## 2023-02-15 DIAGNOSIS — Z3A29 29 weeks gestation of pregnancy: Secondary | ICD-10-CM | POA: Diagnosis not present

## 2023-02-15 DIAGNOSIS — O283 Abnormal ultrasonic finding on antenatal screening of mother: Secondary | ICD-10-CM | POA: Insufficient documentation

## 2023-02-16 NOTE — Progress Notes (Signed)
Cardio-Obstetrics Clinic  Follow Up Note   Date:  02/16/2023   ID:  Sara Lopez, DOB 01-Mar-1996, MRN [id number]  PCP:  Loletta Specter, PA-C   [facility] Providers Cardiologist:  Thomasene Ripple, DO  Electrophysiologist:  Sherryl Manges, MD        Referring MD: Loletta Specter, PA-C   Chief Complaint: I am doing fine  The patient is at home. I am in the office.  Virtual Visit via telephone  Note . I connected with the patient today by a telephone enabled telemedicine application and verified that I am speaking with the correct person using two identifiers.  History of Present Illness:    Sara Lopez is a 27 y.o. female [G2P1001] who returns for follow up of cardiovascular care in pregnancy. She is currently 30 weeks pregnancy.  Her medical history includes of hypertrophic cardiomyopathy diagnosed as a teenager is now status post Medtronic ICD due to ventricular arrhythmia for secondary prevention with initial ICD placement in 2011 and later change in 2020 she has rerelease follow-up with Dr. Graciela Husbands, myocardial bridging in the LAD which was done in a left heart catheterization at [facility] in 2014, former smoker.  We took care of the patient in 2022 and she was able to deliver safely vaginally on May 06, 2021 without any complications.  At her postpartum visit which was 12/20/2021 she was doing well.  She was off of metoprolol.   I saw the patient at 20 weeks pregnancy, at that time she was doing well from a CV standpoint. In there interim her device interrogation reported increase pvc so I started her on low dose toprol xl.  She is her today with her toddler daughter. No complaints at this time.   Prior CV Studies Reviewed: The following studies were reviewed today:  Transthoracic echocardiogram May 26, 2021 IMPRESSIONS     1. There is no significant outflow tract gradient. Left ventricular  ejection fraction, by estimation, is 65  to 70%. The left ventricle has  normal function. The left ventricle has no regional wall motion  abnormalities. There is moderate left ventricular  hypertrophy. Left ventricular diastolic parameters are consistent with  Grade I diastolic dysfunction (impaired relaxation).   2. Right ventricular systolic function is normal. The right ventricular  size is mildly enlarged. There is normal pulmonary artery systolic  pressure. The estimated right ventricular systolic pressure is 23.4 mmHg.   3. Left atrial size was moderately dilated.   4. A small pericardial effusion is present. The pericardial effusion is  circumferential. There is no evidence of cardiac tamponade.   5. Systolic motion of the mitral valve. The mitral valve is normal in  structure. No evidence of mitral valve regurgitation. No evidence of  mitral stenosis.   6. The aortic valve is normal in structure. Aortic valve regurgitation is  mild. No aortic stenosis is present.   7. Pulmonic valve regurgitation is moderate.   8. The inferior vena cava is normal in size with greater than 50%  respiratory variability, suggesting right atrial pressure of 3 mmHg.   Comparison(s): No significant change from prior study. Prior images  reviewed side by side.   Conclusion(s)/Recommendation(s): Findings consistent with hypertrophic  cardiomyopathy.   FINDINGS   Left Ventricle: There is no significant outflow tract gradient. Left  ventricular ejection fraction, by estimation, is 65 to 70%. The left  ventricle has normal function. The left ventricle has no regional wall  motion abnormalities. The left  ventricular  internal cavity size was normal in size. There is moderate left  ventricular hypertrophy. Left ventricular diastolic parameters are  consistent with Grade I diastolic dysfunction (impaired relaxation).   Right Ventricle: The right ventricular size is mildly enlarged. No  increase in right ventricular wall thickness. Right  ventricular systolic  function is normal. There is normal pulmonary artery systolic pressure.  The tricuspid regurgitant velocity is 2.26   m/s, and with an assumed right atrial pressure of 3 mmHg, the estimated  right ventricular systolic pressure is 23.4 mmHg.   Left Atrium: Left atrial size was moderately dilated.   Right Atrium: Right atrial size was normal in size.   Pericardium: A small pericardial effusion is present. The pericardial  effusion is circumferential. There is no evidence of cardiac tamponade.   Mitral Valve: Systolic motion of the mitral valve. The mitral valve is  normal in structure. No evidence of mitral valve regurgitation. No  evidence of mitral valve stenosis.   Tricuspid Valve: The tricuspid valve is normal in structure. Tricuspid  valve regurgitation is trivial. No evidence of tricuspid stenosis.   Aortic Valve: The aortic valve is normal in structure. Aortic valve  regurgitation is mild. Aortic regurgitation PHT measures 573 msec. No  aortic stenosis is present.   Pulmonic Valve: The pulmonic valve was normal in structure. Pulmonic valve  regurgitation is moderate. No evidence of pulmonic stenosis.   Aorta: The aortic root is normal in size and structure.   Venous: The inferior vena cava is normal in size with greater than 50%  respiratory variability, suggesting right atrial pressure of 3 mmHg.   IAS/Shunts: No atrial level shunt detected by color flow Doppler.   Additional Comments: A device lead is visualized in the right ventricle.   Past Medical History:  Diagnosis Date   ADHD    ADHD (attention deficit hyperactivity disorder) 07/21/2011   Cardiac pacemaker in situ 03/27/2018   Coronary-myocardial bridge    LHC at [facility] 2/14: LAD mid myocardial bridge (patent in diastole and near complete occlusion distally) // Myoview 2/14: Inferior attenuation, no ischemia, normal EF   Depression    Development delay    Developmental delay 07/21/2011    History of cardiac arrest 09/21/2016   History of echocardiogram 08/2016   Echo 5/18: severe asymmetric septal hypertrophy, EF 60-65, dynamic obstruction at rest, peak LVOT 328 cm/sec and peak gradient 43 mmHg, no RWMA, septal thickness 24.6 mm, post wall thickness 15.5 mm, +SAM, mild MR, severe LAE, PASP 30   History of echocardiogram 1.2014 at [facility]   Echo 1/14: Hypertrophic CM, mild dynamic LVOT - peak 27 mmHg, chordal SAM with LVOT obstruction, mild MR, dilate LCA, abnormal LV diastolic function, hyperdynamic LV systolic function    History of MI (myocardial infarction) 09/16/2016   HOCM (hypertrophic obstructive cardiomyopathy) (HCC)    ICD (implantable cardioverter-defibrillator) in place    Medtronic model D334DRM (Protect), serial number [id number], implanted 04/30/2010    Myocardial infarction (HCC)    hx of elevated Tn levels // LHC in 2014 at [facility] with myocardial bridging   Postpartum visit 06/09/2021   Red Chart Rounds Patient 12/19/2020   Red Chart Rounds Patient Care Plan     Sara Lopez  G1P0  Current GA: [redacted]w[redacted]d          OB History  Gravida  Para  Term  Preterm  AB  Living  1  0  0  0  0  0  SAB  IAB  Ectopic  Multiple  Live Births  0  0  0  0  0        Patient encounter date not specified.        Red Chart Rounds Diagnosis: HOCM s/p remote cardiac arrest with ICD in place, coronary-myocardial bridge     Consultants: Cards,    Single implantable cardioverter-defibrillator (ICD) in situ 07/21/2011   VF (ventricular fibrillation) (HCC)    admitted in 2011 with VF arrest >> dx with HOCM // s/p ICD    Past Surgical History:  Procedure Laterality Date   ICD GENERATOR CHANGEOUT N/A 05/10/2018   Procedure: ICD GENERATOR CHANGEOUT;  Surgeon: Duke Salvia, MD;  Location: [facility] INVASIVE CV LAB;  Service: Cardiovascular;  Laterality: N/A;   ICD IMPLANT  2011      OB History     Gravida  2   Para  1   Term  1   Preterm      AB      Living  1      SAB      IAB       Ectopic      Multiple  0   Live Births  1               Current Medications: Current Meds  Medication Sig   metoprolol succinate (TOPROL XL) 25 MG 24 hr tablet Take 0.5 tablets (12.5 mg total) by mouth daily.   pantoprazole (PROTONIX) 20 MG tablet Take 1 tablet (20 mg total) by mouth daily.   Prenatal Vit-Fe Fumarate-FA (MULTIVITAMIN-PRENATAL) 27-0.8 MG TABS tablet Take 1 tablet by mouth daily at 12 noon.     Allergies:   Patient has no known allergies.   Social History   Socioeconomic History   Marital status: Single    Spouse name: Not on file   Number of children: Not on file   Years of education: Not on file   Highest education level: Not on file  Occupational History   Occupation: Conservation officer, nature  Tobacco Use   Smoking status: Former    Current packs/day: 0.00    Types: Cigarettes    Quit date: 07/01/2020    Years since quitting: 2.6   Smokeless tobacco: Former  Building services engineer status: Former   Substances: Nicotine   Devices: not since preg 06/2020  Substance and Sexual Activity   Alcohol use: Not Currently    Alcohol/week: 1.0 standard drink of alcohol    Types: 1 Glasses of wine per week    Comment: not since preg   Drug use: No    Types: Marijuana    Comment: no drugs since 2011   Sexual activity: Not Currently    Birth control/protection: None, Pill  Other Topics Concern   Not on file  Social History Narrative   Trying to get a job with [employer]    Currently working as Conservation officer, 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   Single    No kids   Social Determinants of Corporate investment banker Strain: Not on file  Food Insecurity: No Food Insecurity (04/23/2021)   Hunger Vital Sign    Worried About Running Out of Food in the Last Year: Never true    Ran Out of Food in the Last Year: Never true  Recent Concern: Food Insecurity - Food Insecurity Present (03/11/2021)   Hunger Vital Sign    Worried About Running Out of Food in the Last Year: Sometimes  true    Ran Out of Food in the Last Year: Never true  Transportation Needs: No Transportation Needs (04/23/2021)   PRAPARE - Administrator, Civil Service (Medical): No    Lack of Transportation (Non-Medical): No  Physical Activity: Not on file  Stress: Not on file  Social Connections: Not on file      Family History  Adopted: Yes      ROS:   Please see the history of present illness.     All other systems reviewed and are negative.   Labs/EKG Reviewed:    EKG:   EKG is was not ordered today.    Recent Labs: 02/03/2023: Hemoglobin 11.5; Platelets 188   Recent Lipid Panel No results found for: "CHOL", "TRIG", "HDL", "CHOLHDL", "LDLCALC", "LDLDIRECT"  Physical Exam:    VS:  BP 101/61 (BP Location: Right Arm, Patient Position: Sitting, Cuff Size: Normal)   Pulse 87   Ht 5\' 5"  (1.651 m)   Wt 209 lb 6.4 oz (95 kg)   SpO2 98%   BMI 34.85 kg/m     Wt Readings from Last 3 Encounters:  02/15/23 209 lb 6.4 oz (95 kg)  02/03/23 205 lb 1.6 oz (93 kg)  01/13/23 198 lb 9.6 oz (90.1 kg)   Virtual visit no physical exam performed.   Risk Assessment/Risk Calculators:                 ASSESSMENT & PLAN:    Hypertrophic obstructive cardiomyopathy History of cardiac arrest ICD in situ-Medtronics  Hypertrophic Cardiomyopathy Stable with no cardiovascular complaints at 29 weeks and 6 days gestation. -Continue Toprol XL.  Pregnancy At 29 weeks and 6 days gestation with second child. -Follow up in 8 weeks.  Gastroesophageal Reflux Disease Complaints of heartburn during pregnancy. -Consider antacid therapy if symptoms persist.  Review her most recent last interrogation.  Explained to the patient the most important thing during the pregnancy with her HCM is to make sure that she is not dehydrated to continue to increase her hydration, also if she does become short of breath that continues to worsen to notify us immediately as I do have low threshold to  start her on low-dose beta-blocker during this pregnancy.  There are no Patient Instructions on file for this visit.   Dispo:  No follow-ups on file.   Medication Adjustments/Labs and Tests Ordered: Current medicines are reviewed at length with the patient today.  Concerns regarding medicines are outlined above.  Tests Ordered: No orders of the defined types were placed in this encounter.  Medication Changes: No orders of the defined types were placed in this encounter.

## 2023-02-17 ENCOUNTER — Other Ambulatory Visit: Payer: Self-pay | Admitting: *Deleted

## 2023-02-17 DIAGNOSIS — Z362 Encounter for other antenatal screening follow-up: Secondary | ICD-10-CM

## 2023-02-21 ENCOUNTER — Ambulatory Visit: Payer: Medicaid Other

## 2023-02-21 DIAGNOSIS — I421 Obstructive hypertrophic cardiomyopathy: Secondary | ICD-10-CM

## 2023-02-21 LAB — CUP PACEART REMOTE DEVICE CHECK
Battery Remaining Longevity: 91 mo
Battery Voltage: 3.01 V
Brady Statistic RV Percent Paced: 1.44 %
Date Time Interrogation Session: 20241021043823
HighPow Impedance: 59 Ohm
Implantable Lead Connection Status: 753985
Implantable Lead Implant Date: 20111229
Implantable Lead Location: 753860
Implantable Lead Model: 7122
Implantable Pulse Generator Implant Date: 20200108
Lead Channel Impedance Value: 836 Ohm
Lead Channel Impedance Value: 950 Ohm
Lead Channel Pacing Threshold Amplitude: 2.5 V
Lead Channel Pacing Threshold Pulse Width: 0.4 ms
Lead Channel Sensing Intrinsic Amplitude: 14.125 mV
Lead Channel Sensing Intrinsic Amplitude: 14.125 mV
Lead Channel Setting Pacing Amplitude: 3.5 V
Lead Channel Setting Pacing Pulse Width: 1 ms
Lead Channel Setting Sensing Sensitivity: 0.6 mV
Zone Setting Status: 755011
Zone Setting Status: 755011

## 2023-02-23 ENCOUNTER — Ambulatory Visit: Payer: Medicaid Other | Admitting: Cardiology

## 2023-02-24 ENCOUNTER — Other Ambulatory Visit: Payer: Self-pay

## 2023-02-24 ENCOUNTER — Ambulatory Visit (INDEPENDENT_AMBULATORY_CARE_PROVIDER_SITE_OTHER): Payer: Medicaid Other | Admitting: Obstetrics and Gynecology

## 2023-02-24 ENCOUNTER — Encounter: Payer: Self-pay | Admitting: Obstetrics and Gynecology

## 2023-02-24 VITALS — BP 119/78 | HR 83 | Wt 213.2 lb

## 2023-02-24 DIAGNOSIS — Z23 Encounter for immunization: Secondary | ICD-10-CM

## 2023-02-24 DIAGNOSIS — Q245 Malformation of coronary vessels: Secondary | ICD-10-CM

## 2023-02-24 DIAGNOSIS — Z3A31 31 weeks gestation of pregnancy: Secondary | ICD-10-CM

## 2023-02-24 DIAGNOSIS — I421 Obstructive hypertrophic cardiomyopathy: Secondary | ICD-10-CM

## 2023-02-24 DIAGNOSIS — Z3009 Encounter for other general counseling and advice on contraception: Secondary | ICD-10-CM

## 2023-02-24 DIAGNOSIS — O0990 Supervision of high risk pregnancy, unspecified, unspecified trimester: Secondary | ICD-10-CM | POA: Diagnosis not present

## 2023-02-24 NOTE — Progress Notes (Signed)
   PRENATAL VISIT NOTE  Subjective:  Sara Lopez is a 27 y.o. G2P1001 at [redacted]w[redacted]d being seen today for ongoing prenatal care.  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  Patient reports no complaints.  Contractions: Not present. Vag. Bleeding: None.  Movement: Present. Denies leaking of fluid.   The following portions of the patient's history were reviewed and updated as appropriate: allergies, current medications, past family history, past medical history, past social history, past surgical history and problem list.   Objective:   Vitals:   02/24/23 1328  BP: 119/78  Pulse: 83  Weight: 213 lb 3.2 oz (96.7 kg)    Fetal Status: Fetal Heart Rate (bpm): 147   Movement: Present     General:  Alert, oriented and cooperative. Patient is in no acute distress.  Skin: Skin is warm and dry. No rash noted.   Cardiovascular: Normal heart rate noted  Respiratory: Normal respiratory effort, no problems with respiration noted  Abdomen: Soft, gravid, appropriate for gestational age.  Pain/Pressure: Present     Pelvic: Cervical exam deferred        Extremities: Normal range of motion.  Edema: None  Mental Status: Normal mood and affect. Normal behavior. Normal judgment and thought content.   Assessment and Plan:  Pregnancy: G2P1001 at [redacted]w[redacted]d 1. Supervision of high risk pregnancy, antepartum S/p tdap  2. HOCM (hypertrophic obstructive cardiomyopathy) (HCC) Normal fetal echo Will get her scheduled with Dr. Mallory Shirk office for third trimester appointment.   3. Coronary-myocardial bridge Follows with  cards  4. Unwanted fertility - She desires permanent sterilization. Discussed alternatives including LARC options and vasectomy. She declines these options.  - Discussed surgery of salpingectomy. She would like to do a salpingectomy.  - Risks of surgery include but are not limited to: bleeding, infection, injury to surrounding organs/tissues (i.e. bowel/bladder/ureters), need for additional procedures, wound complications, hospital re-admission, and conversion to open surgery, VTE - Reviewed restrictions and recovery following surgery - Tubal papers signed  5. Pregnancy with 31 completed weeks gestation   Preterm labor symptoms and general obstetric precautions including but not limited to vaginal bleeding, contractions, leaking of fluid and fetal movement were reviewed in detail with the patient. Please refer to After Visit Summary for other counseling recommendations.   Return in about 2 weeks (around 03/10/2023) for HROB VISIT.  Future Appointments  Date Time Provider Department Center  03/25/2023  2:15 PM [facility] NURSE [facility]  03/25/2023  2:30 PM [facility] US2 [facility]  05/23/2023  8:10 AM [facility] DEVICE REMOTES [facility]  08/22/2023  8:10 AM [facility] DEVICE REMOTES [facility]  11/21/2023  8:10 AM [facility] DEVICE REMOTES [facility]  02/20/2024  8:10 AM [facility] DEVICE REMOTES [facility]  05/21/2024  8:10 AM [facility] DEVICE REMOTES [facility]  08/20/2024  8:10 AM [facility] DEVICE REMOTES [facility]    Milas Hock, MD

## 2023-02-24 NOTE — Addendum Note (Signed)
Addended by: Cinda Quest A on: 02/24/2023 01:50 PM   Modules accepted: Orders

## 2023-03-10 ENCOUNTER — Other Ambulatory Visit: Payer: Self-pay

## 2023-03-10 ENCOUNTER — Encounter: Payer: Self-pay | Admitting: Obstetrics and Gynecology

## 2023-03-10 ENCOUNTER — Ambulatory Visit: Payer: Medicaid Other | Admitting: Obstetrics and Gynecology

## 2023-03-10 VITALS — BP 117/67 | HR 83 | Wt 213.0 lb

## 2023-03-10 DIAGNOSIS — Z95810 Presence of automatic (implantable) cardiac defibrillator: Secondary | ICD-10-CM

## 2023-03-10 DIAGNOSIS — R55 Syncope and collapse: Secondary | ICD-10-CM

## 2023-03-10 DIAGNOSIS — O09893 Supervision of other high risk pregnancies, third trimester: Secondary | ICD-10-CM

## 2023-03-10 DIAGNOSIS — O09899 Supervision of other high risk pregnancies, unspecified trimester: Secondary | ICD-10-CM

## 2023-03-10 DIAGNOSIS — Z950 Presence of cardiac pacemaker: Secondary | ICD-10-CM

## 2023-03-10 DIAGNOSIS — Q245 Malformation of coronary vessels: Secondary | ICD-10-CM

## 2023-03-10 DIAGNOSIS — O0993 Supervision of high risk pregnancy, unspecified, third trimester: Secondary | ICD-10-CM

## 2023-03-10 DIAGNOSIS — O0990 Supervision of high risk pregnancy, unspecified, unspecified trimester: Secondary | ICD-10-CM

## 2023-03-10 DIAGNOSIS — Z2839 Other underimmunization status: Secondary | ICD-10-CM

## 2023-03-10 DIAGNOSIS — I421 Obstructive hypertrophic cardiomyopathy: Secondary | ICD-10-CM

## 2023-03-10 DIAGNOSIS — Z3009 Encounter for other general counseling and advice on contraception: Secondary | ICD-10-CM

## 2023-03-10 DIAGNOSIS — Z3A33 33 weeks gestation of pregnancy: Secondary | ICD-10-CM

## 2023-03-10 NOTE — Progress Notes (Signed)
   PRENATAL VISIT NOTE  Subjective:  Sara Lopez is a 27 y.o. G2P1001 at [redacted]w[redacted]d being seen today for ongoing prenatal care.  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  Patient reports  hot, sweaty, feeling like she will vomit but them improved with water .  Contractions: Not present. Vag. Bleeding: None.  Movement: Present. Denies leaking of fluid.   The following portions of the patient's history were reviewed and updated as appropriate: allergies, current medications, past family history, past medical history, past social history, past surgical history and problem list.   Objective:   Vitals:   03/10/23 1612  BP: 117/67  Pulse: 83  Weight: 213 lb (96.6 kg)    Fetal Status: Fetal Heart Rate (bpm): 159   Movement: Present     General:  Alert, oriented and cooperative. Patient is in no acute distress.  Skin: Skin is warm and dry. No rash noted.   Cardiovascular: Normal heart rate noted  Respiratory: Normal respiratory effort, no problems with respiration noted  Abdomen: Soft, gravid, appropriate for gestational age.  Pain/Pressure: Present     Pelvic: Cervical exam deferred        Extremities: Normal range of motion.  Edema: None  Mental Status: Normal mood and affect. Normal behavior. Normal judgment and thought content.   Assessment and Plan:  Pregnancy: G2P1001 at [redacted]w[redacted]d  2. Unwanted fertility For BTL  3. Rubella non-immune status, antepartum MMR pp  4. ICD (implantable cardioverter-defibrillator) in place - MDT  5. Supervision of high risk pregnancy, antepartum - MFM follow  up growth on 03/25/23  6. Cardiac pacemaker in situ  7. [redacted] weeks gestation of pregnancy  8. Vasovagal episode - On my arrival, patient feeling hot, sweaty and nauseous, improved with water and back to normal within 2 minutes - Encouraged hydration  9. HOCM (hypertrophic obstructive cardiomyopathy) (HCC) Has appt with Dr. Servando Salina tomorrow Compliant with metoprolol Plan for passive second stage Plan for IOL 39 weeks   Preterm labor symptoms and general obstetric precautions including but not limited to vaginal bleeding, contractions, leaking of fluid and fetal movement were reviewed in detail with the patient. Please refer to After Visit Summary for other counseling recommendations.   Return in about 2 weeks (around 03/24/2023) for high OB.  Future Appointments  Date Time Provider Department Center  03/11/2023  1:20 PM Tobb, Kardie, DO [facility] None  03/25/2023  2:15 PM [facility] NURSE [facility]  03/25/2023  2:30 PM [facility] US2 [facility]  05/23/2023  8:10 AM [facility] DEVICE REMOTES [facility]  08/22/2023  8:10 AM [facility] DEVICE REMOTES [facility]  11/21/2023  8:10 AM [facility] DEVICE REMOTES [facility]  02/20/2024  8:10 AM [facility] DEVICE REMOTES [facility]  05/21/2024  8:10 AM [facility] DEVICE REMOTES [facility]  08/20/2024  8:10 AM [facility] DEVICE REMOTES [facility]    Conan Bowens, MD

## 2023-03-10 NOTE — Progress Notes (Signed)
Remote ICD transmission.   

## 2023-03-11 ENCOUNTER — Ambulatory Visit (INDEPENDENT_AMBULATORY_CARE_PROVIDER_SITE_OTHER): Payer: Medicaid Other | Admitting: Cardiology

## 2023-03-11 ENCOUNTER — Encounter: Payer: Self-pay | Admitting: Cardiology

## 2023-03-11 VITALS — BP 122/80 | HR 94 | Ht 65.0 in | Wt 213.0 lb

## 2023-03-11 DIAGNOSIS — O0990 Supervision of high risk pregnancy, unspecified, unspecified trimester: Secondary | ICD-10-CM | POA: Diagnosis not present

## 2023-03-11 DIAGNOSIS — R42 Dizziness and giddiness: Secondary | ICD-10-CM | POA: Diagnosis not present

## 2023-03-11 DIAGNOSIS — R55 Syncope and collapse: Secondary | ICD-10-CM | POA: Diagnosis not present

## 2023-03-11 DIAGNOSIS — I421 Obstructive hypertrophic cardiomyopathy: Secondary | ICD-10-CM | POA: Diagnosis not present

## 2023-03-11 DIAGNOSIS — Z3A33 33 weeks gestation of pregnancy: Secondary | ICD-10-CM | POA: Diagnosis not present

## 2023-03-11 NOTE — Progress Notes (Unsigned)
Cardio-Obstetrics Clinic  Follow Up Note   Date:  03/12/2023   ID:  Sara Lopez, DOB 1995-07-15, MRN [id number]  PCP:  Loletta Specter, PA-C   [facility] Providers Cardiologist:  Thomasene Ripple, DO  Electrophysiologist:  Sherryl Manges, MD        Referring MD: Milas Hock, MD   Chief Complaint: I am doing fine  The patient is at home. I am in the office.  Virtual Visit via telephone  Note . I connected with the patient today by a telephone enabled telemedicine application and verified that I am speaking with the correct person using two identifiers.  History of Present Illness:    Sara Lopez is a 27 y.o. female [G2P1001] who returns for follow up of cardiovascular care in pregnancy. She is currently 30 weeks pregnancy.  Her medical history includes of hypertrophic cardiomyopathy diagnosed as a teenager is now status post Medtronic ICD due to ventricular arrhythmia for secondary prevention with initial ICD placement in 2011 and later change in 2020 she has rerelease follow-up with Dr. Graciela Husbands, myocardial bridging in the LAD which was done in a left heart catheterization at [facility] in 2014, former smoker.  We took care of the patient in 2022 and she was able to deliver safely vaginally on May 06, 2021 without any complications.  At her postpartum visit which was 12/20/2021 she was doing well.  She was off of metoprolol.   I saw the patient at 20 weeks pregnancy, at that time she was doing well from a CV standpoint. In there interim her device interrogation reported increase pvc so I started her on low dose toprol xl.  At her last visit she as doing well. No medication change.  Today she is here for a follow up visit.   She reports a recent episode of feeling faint, blurry vision, and nausea. She reports that the episode occurred after rushing and carrying a toddler. She did not pass out but felt like she was going to. She also reports increased  shortness of breath, which she attributes to being in the final stretch of her pregnancy. Saba was recently evaluated by an OB due to contractions. She denies any chest pain.   Prior CV Studies Reviewed: The following studies were reviewed today:  Transthoracic echocardiogram May 26, 2021 IMPRESSIONS     1. There is no significant outflow tract gradient. Left ventricular  ejection fraction, by estimation, is 65 to 70%. The left ventricle has  normal function. The left ventricle has no regional wall motion  abnormalities. There is moderate left ventricular  hypertrophy. Left ventricular diastolic parameters are consistent with  Grade I diastolic dysfunction (impaired relaxation).   2. Right ventricular systolic function is normal. The right ventricular  size is mildly enlarged. There is normal pulmonary artery systolic  pressure. The estimated right ventricular systolic pressure is 23.4 mmHg.   3. Left atrial size was moderately dilated.   4. A small pericardial effusion is present. The pericardial effusion is  circumferential. There is no evidence of cardiac tamponade.   5. Systolic motion of the mitral valve. The mitral valve is normal in  structure. No evidence of mitral valve regurgitation. No evidence of  mitral stenosis.   6. The aortic valve is normal in structure. Aortic valve regurgitation is  mild. No aortic stenosis is present.   7. Pulmonic valve regurgitation is moderate.   8. The inferior vena cava is normal in size with greater than 50%  respiratory variability, suggesting right atrial pressure of 3 mmHg.   Comparison(s): No significant change from prior study. Prior images  reviewed side by side.   Conclusion(s)/Recommendation(s): Findings consistent with hypertrophic  cardiomyopathy.   FINDINGS   Left Ventricle: There is no significant outflow tract gradient. Left  ventricular ejection fraction, by estimation, is 65 to 70%. The left  ventricle has  normal function. The left ventricle has no regional wall  motion abnormalities. The left ventricular  internal cavity size was normal in size. There is moderate left  ventricular hypertrophy. Left ventricular diastolic parameters are  consistent with Grade I diastolic dysfunction (impaired relaxation).   Right Ventricle: The right ventricular size is mildly enlarged. No  increase in right ventricular wall thickness. Right ventricular systolic  function is normal. There is normal pulmonary artery systolic pressure.  The tricuspid regurgitant velocity is 2.26   m/s, and with an assumed right atrial pressure of 3 mmHg, the estimated  right ventricular systolic pressure is 23.4 mmHg.   Left Atrium: Left atrial size was moderately dilated.   Right Atrium: Right atrial size was normal in size.   Pericardium: A small pericardial effusion is present. The pericardial  effusion is circumferential. There is no evidence of cardiac tamponade.   Mitral Valve: Systolic motion of the mitral valve. The mitral valve is  normal in structure. No evidence of mitral valve regurgitation. No  evidence of mitral valve stenosis.   Tricuspid Valve: The tricuspid valve is normal in structure. Tricuspid  valve regurgitation is trivial. No evidence of tricuspid stenosis.   Aortic Valve: The aortic valve is normal in structure. Aortic valve  regurgitation is mild. Aortic regurgitation PHT measures 573 msec. No  aortic stenosis is present.   Pulmonic Valve: The pulmonic valve was normal in structure. Pulmonic valve  regurgitation is moderate. No evidence of pulmonic stenosis.   Aorta: The aortic root is normal in size and structure.   Venous: The inferior vena cava is normal in size with greater than 50%  respiratory variability, suggesting right atrial pressure of 3 mmHg.   IAS/Shunts: No atrial level shunt detected by color flow Doppler.   Additional Comments: A device lead is visualized in the right  ventricle.   Past Medical History:  Diagnosis Date   ADHD    ADHD (attention deficit hyperactivity disorder) 07/21/2011   Cardiac pacemaker in situ 03/27/2018   Coronary-myocardial bridge    LHC at [facility] 2/14: LAD mid myocardial bridge (patent in diastole and near complete occlusion distally) // Myoview 2/14: Inferior attenuation, no ischemia, normal EF   Depression    Development delay    Developmental delay 07/21/2011   History of cardiac arrest 09/21/2016   History of echocardiogram 08/2016   Echo 5/18: severe asymmetric septal hypertrophy, EF 60-65, dynamic obstruction at rest, peak LVOT 328 cm/sec and peak gradient 43 mmHg, no RWMA, septal thickness 24.6 mm, post wall thickness 15.5 mm, +SAM, mild MR, severe LAE, PASP 30   History of echocardiogram 1.2014 at [facility]   Echo 1/14: Hypertrophic CM, mild dynamic LVOT - peak 27 mmHg, chordal SAM with LVOT obstruction, mild MR, dilate LCA, abnormal LV diastolic function, hyperdynamic LV systolic function    History of MI (myocardial infarction) 09/16/2016   HOCM (hypertrophic obstructive cardiomyopathy) (HCC)    ICD (implantable cardioverter-defibrillator) in place    Medtronic model D334DRM (Protect), serial number [id number], implanted 04/30/2010    Myocardial infarction (HCC)    hx of elevated Tn levels // LHC in  2014 at [facility] with myocardial bridging   Postpartum visit 06/09/2021   Red Chart Rounds Patient 12/19/2020   Red Chart Rounds Patient Care Plan     Ames Coupe Rashad  G1P0  Current GA: [redacted]w[redacted]d          OB History  Gravida  Para  Term  Preterm  AB  Living  1  0  0  0  0  0  SAB  IAB  Ectopic  Multiple  Live Births  0  0  0  0  0        Patient encounter date not specified.        Red Chart Rounds Diagnosis: HOCM s/p remote cardiac arrest with ICD in place, coronary-myocardial bridge     Consultants: Cards,    Single implantable cardioverter-defibrillator (ICD) in situ 07/21/2011   VF (ventricular fibrillation) (HCC)    admitted in 2011  with VF arrest >> dx with HOCM // s/p ICD    Past Surgical History:  Procedure Laterality Date   ICD GENERATOR CHANGEOUT N/A 05/10/2018   Procedure: ICD GENERATOR CHANGEOUT;  Surgeon: [facility] Salvia, MD;  Location: [facility] INVASIVE CV LAB;  Service: Cardiovascular;  Laterality: N/A;   ICD IMPLANT  2011      OB History     Gravida  2   Para  1   Term  1   Preterm      AB      Living  1      SAB      IAB      Ectopic      Multiple  0   Live Births  1               Current Medications: Current Meds  Medication Sig   metoprolol succinate (TOPROL XL) 25 MG 24 hr tablet Take 0.5 tablets (12.5 mg total) by mouth daily.   pantoprazole (PROTONIX) 20 MG tablet Take 1 tablet (20 mg total) by mouth daily.   Prenatal Vit-Fe Fumarate-FA (MULTIVITAMIN-PRENATAL) 27-0.8 MG TABS tablet Take 1 tablet by mouth daily at 12 noon.     Allergies:   Patient has no known allergies.   Social History   Socioeconomic History   Marital status: Single    Spouse name: Not on file   Number of children: Not on file   Years of education: Not on file   Highest education level: Not on file  Occupational History   Occupation: Conservation officer, nature  Tobacco Use   Smoking status: Former    Current packs/day: 0.00    Types: Cigarettes    Quit date: 07/01/2020    Years since quitting: 2.6   Smokeless tobacco: Former  Building services engineer status: Former   Substances: Nicotine   Devices: not since preg 06/2020  Substance and Sexual Activity   Alcohol use: Not Currently    Alcohol/week: 1.0 standard drink of alcohol    Types: 1 Glasses of wine per week    Comment: not since preg   Drug use: No    Types: Marijuana    Comment: no drugs since 2011   Sexual activity: Not Currently    Birth control/protection: None, Pill  Other Topics Concern   Not on file  Social History Narrative   Trying to get a job with [employer]    Currently working as Conservation officer, 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   Single     No kids   Social Determinants  of Health   Financial Resource Strain: Not on file  Food Insecurity: No Food Insecurity (04/23/2021)   Hunger Vital Sign    Worried About Running Out of Food in the Last Year: Never true    Ran Out of Food in the Last Year: Never true  Recent Concern: Food Insecurity - Food Insecurity Present (03/11/2021)   Hunger Vital Sign    Worried About Running Out of Food in the Last Year: Sometimes true    Ran Out of Food in the Last Year: Never true  Transportation Needs: No Transportation Needs (04/23/2021)   PRAPARE - Administrator, Civil Service (Medical): No    Lack of Transportation (Non-Medical): No  Physical Activity: Not on file  Stress: Not on file  Social Connections: Not on file      Family History  Adopted: Yes      ROS:   Please see the history of present illness.     All other systems reviewed and are negative.   Labs/EKG Reviewed:    EKG:   EKG is was not ordered today.    Recent Labs: 02/03/2023: Hemoglobin 11.5; Platelets 188   Recent Lipid Panel No results found for: "CHOL", "TRIG", "HDL", "CHOLHDL", "LDLCALC", "LDLDIRECT"  Physical Exam:    VS:  BP 122/80 (BP Location: Right Arm, Patient Position: Sitting, Cuff Size: Normal)   Pulse 94   Ht 5\' 5"  (1.651 m)   Wt 213 lb (96.6 kg)   SpO2 99%   BMI 35.45 kg/m     Wt Readings from Last 3 Encounters:  03/11/23 213 lb (96.6 kg)  03/10/23 213 lb (96.6 kg)  02/24/23 213 lb 3.2 oz (96.7 kg)   Virtual visit no physical exam performed.   Risk Assessment/Risk Calculators:                 ASSESSMENT & PLAN:    Hypertrophic obstructive cardiomyopathy History of cardiac arrest ICD in situ-Medtronics Presyncope  Episode of near syncope with blurry vision and nausea. No loss of consciousness. Possible vasovagal syncope or anemia. No chest pain or palpitations. -Order CBC to evaluate for anemia. -Order device interrogation to evaluate for  arrhythmias.  Pregnancy (33 weeks) Shortness of breath, likely due to normal physiological changes in pregnancy. No contractions or other complications noted. -Continue routine prenatal care. -Plan for follow-up in 4 weeks ([redacted] weeks gestation).  Will keep her on the toprol. Will ask for an early interrogation of her device prior to delivery given recent presyncope episode. While I think it may be vasovagal - but with know hx of HCM and ventricular arrhythmia I want to make sure we check it prior to delivery.   Explained to the patient the most important thing during the pregnancy with her HCM is to make sure that she is not dehydrated to continue to increase her hydration, also if she does become short of breath that continues to worsen to notify us immediately as I do have low threshold to start her on low-dose beta-blocker during this pregnancy.  Patient Instructions  Medication Instructions:  Your physician recommends that you continue on your current medications as directed. Please refer to the Current Medication list given to you today.  *If you need a refill on your cardiac medications before your next appointment, please call your pharmacy*   Lab Work: CMET, MAG, CBC If you have labs (blood work) drawn today and your tests are completely normal, you will receive your results only by: MyChart  Message (if you have MyChart) OR A paper copy in the mail If you have any lab test that is abnormal or we need to change your treatment, we will call you to review the results.  Testing/Procedures: None   Follow-Up: At [facility], you and your health needs are our priority.  As part of our continuing mission to provide you with exceptional heart care, we have created designated Provider Care Teams.  These Care Teams include your primary Cardiologist (physician) and Advanced Practice Providers (APPs -  Physician Assistants and Nurse Practitioners) who all work together to provide  you with the care you need, when you need it.    Your next appointment:   4 week(s) - overbook  Provider:   Thomasene Ripple, DO      Dispo:  No follow-ups on file.   Medication Adjustments/Labs and Tests Ordered: Current medicines are reviewed at length with the patient today.  Concerns regarding medicines are outlined above.  Tests Ordered: Orders Placed This Encounter  Procedures   Comp Met (CMET)   Magnesium   CBC with Differential/Platelet   Medication Changes: No orders of the defined types were placed in this encounter.

## 2023-03-11 NOTE — Patient Instructions (Addendum)
Medication Instructions:  Your physician recommends that you continue on your current medications as directed. Please refer to the Current Medication list given to you today.  *If you need a refill on your cardiac medications before your next appointment, please call your pharmacy*   Lab Work: CMET, MAG, CBC If you have labs (blood work) drawn today and your tests are completely normal, you will receive your results only by: MyChart Message (if you have MyChart) OR A paper copy in the mail If you have any lab test that is abnormal or we need to change your treatment, we will call you to review the results.  Testing/Procedures: None   Follow-Up: At [facility], you and your health needs are our priority.  As part of our continuing mission to provide you with exceptional heart care, we have created designated Provider Care Teams.  These Care Teams include your primary Cardiologist (physician) and Advanced Practice Providers (APPs -  Physician Assistants and Nurse Practitioners) who all work together to provide you with the care you need, when you need it.    Your next appointment:   4 week(s) - overbook  Provider:   Thomasene Ripple, DO

## 2023-03-15 ENCOUNTER — Telehealth: Payer: Self-pay

## 2023-03-15 NOTE — Telephone Encounter (Signed)
-----   Message from Nurse Richarda Osmond sent at 03/13/2023  1:44 PM EST -----  ----- Message ----- From: Thomasene Ripple, DO Sent: 03/12/2023  11:23 PM EST To: Lenor Coffin, RN; Wiliam Ke, RN; #  Team,   Please help me. She is [redacted] weeks pregnant - has a recent presyncope episode.  While I think it may be vasovagal - but with know hx of HCM and ventricular arrhythmia I want to make sure we check it prior to delivery.  Thanks for your assistance  Dr. Servando Salina

## 2023-03-15 NOTE — Telephone Encounter (Signed)
LM to get manual transmission.

## 2023-03-16 NOTE — Telephone Encounter (Signed)
Patient is away from her home.  She is going to send transmission once she gets home and call us back once completed.

## 2023-03-17 NOTE — Telephone Encounter (Signed)
3248 code # on monitor handheld.  Appears the handheld needs to be charged. Pt requested to let charge and resend in 1 hour. Pt agreeable.

## 2023-03-21 ENCOUNTER — Telehealth: Payer: Self-pay | Admitting: Internal Medicine

## 2023-03-21 NOTE — Telephone Encounter (Signed)
LMTCB

## 2023-03-21 NOTE — Telephone Encounter (Signed)
  1. Has your device fired? No   2. Is you device beeping? No   3. Are you experiencing draining or swelling at device site? No   4. Are you calling to see if we received your device transmission? No   5. Have you passed out? No  Pt cannot send a transmission because of machine is not working at home   Please route to [employer]

## 2023-03-22 ENCOUNTER — Ambulatory Visit: Payer: Medicaid Other | Attending: Cardiovascular Disease

## 2023-03-22 DIAGNOSIS — I421 Obstructive hypertrophic cardiomyopathy: Secondary | ICD-10-CM

## 2023-03-22 LAB — CUP PACEART INCLINIC DEVICE CHECK
Battery Remaining Longevity: 89 mo
Battery Voltage: 3 V
Brady Statistic RV Percent Paced: 0.89 %
Date Time Interrogation Session: 20241119151706
HighPow Impedance: 61 Ohm
Implantable Lead Connection Status: 753985
Implantable Lead Implant Date: 20111229
Implantable Lead Location: 753860
Implantable Lead Model: 7122
Implantable Pulse Generator Implant Date: 20200108
Lead Channel Impedance Value: 855 Ohm
Lead Channel Impedance Value: 988 Ohm
Lead Channel Pacing Threshold Amplitude: 0 V
Lead Channel Pacing Threshold Amplitude: 2 V
Lead Channel Pacing Threshold Amplitude: 2 V
Lead Channel Pacing Threshold Amplitude: 2.5 V
Lead Channel Pacing Threshold Pulse Width: 0 ms
Lead Channel Pacing Threshold Pulse Width: 0.4 ms
Lead Channel Pacing Threshold Pulse Width: 1 ms
Lead Channel Pacing Threshold Pulse Width: 1 ms
Lead Channel Sensing Intrinsic Amplitude: 14.875 mV
Lead Channel Sensing Intrinsic Amplitude: 15 mV
Lead Channel Setting Pacing Amplitude: 3.5 V
Lead Channel Setting Pacing Pulse Width: 1 ms
Lead Channel Setting Sensing Sensitivity: 0.6 mV
Zone Setting Status: 755011
Zone Setting Status: 755011

## 2023-03-22 NOTE — Telephone Encounter (Signed)
Outreach made to Pt.  She is unable to send a manual transmission.  Coming for device clinic check today at 3:00 pm.

## 2023-03-22 NOTE — Patient Instructions (Signed)
Follow up as scheduled.  

## 2023-03-22 NOTE — Progress Notes (Unsigned)
Patient seen in device clinic because she was unable to send a manual transmission. Device check WNL. No episodes noted. Will route to Dr. Servando Salina for review.

## 2023-03-23 ENCOUNTER — Encounter: Payer: Self-pay | Admitting: Family Medicine

## 2023-03-23 NOTE — Progress Notes (Unsigned)
   PRENATAL VISIT NOTE  Subjective:  Sara Lopez is a 27 y.o. G2P1001 at [redacted]w[redacted]d being seen today for ongoing prenatal care.  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  Patient reports {[id number]}.   .  .   . Denies leaking of fluid.   The following portions of the patient's history were reviewed and updated as appropriate: allergies, current medications, past family history, past medical history, past social history, past surgical history and problem list.   Objective:  There were no vitals filed for this visit.  Fetal Status:           General:  Alert, oriented and cooperative. Patient is in no acute distress.  Skin: Skin is warm and dry. No rash noted.   Cardiovascular: Normal heart rate noted  Respiratory: Normal respiratory effort, no problems with respiration noted  Abdomen: Soft, gravid, appropriate for gestational age.        Pelvic: Cervical exam performed in the presence of a chaperone        Extremities: Normal range of motion.     Mental Status: Normal mood and affect. Normal behavior. Normal judgment and thought content.   Assessment and Plan:  Pregnancy: G2P1001 at [redacted]w[redacted]d 1. HOCM (hypertrophic obstructive cardiomyopathy) (HCC) Compliant with metoprolol Plan for passive second stage Plan for IOL 39 weeks Following with Dr. Servando Salina  2. Coronary-myocardial bridge  3. Unwanted fertility For PPTL/BTS  4. Supervision of high risk pregnancy, antepartum - [place] with MFM tomorrow - Cultures done today  5. Rubella non-immune status, antepartum MMR PP  6. ICD (implantable  cardioverter-defibrillator) in place - MDT  7. Pregnancy with 35 completed weeks gestation   Preterm labor symptoms and general obstetric precautions including but not limited to vaginal bleeding, contractions, leaking of fluid and fetal movement were reviewed in detail with the patient. Please refer to After Visit Summary for other counseling recommendations.   No follow-ups on file.  Future Appointments  Date Time Provider Department Center  03/24/2023  3:55 PM Milas Hock, MD [facility]  03/25/2023  2:15 PM [facility] NURSE [facility]  03/25/2023  2:30 PM [facility] US2 [facility]  04/08/2023 10:40 AM Tobb, Kardie, DO [facility] None  05/23/2023  8:10 AM [facility] DEVICE REMOTES [facility]  08/22/2023  8:10 AM [facility] DEVICE REMOTES [facility]  11/21/2023  8:10 AM [facility] DEVICE REMOTES [facility]  02/20/2024  8:10 AM [facility] DEVICE REMOTES [facility]  05/21/2024  8:10 AM [facility] DEVICE REMOTES [facility]  08/20/2024  8:10 AM [facility] DEVICE REMOTES [facility]    Milas Hock, MD

## 2023-03-24 ENCOUNTER — Encounter: Payer: Medicaid Other | Admitting: Obstetrics and Gynecology

## 2023-03-24 ENCOUNTER — Ambulatory Visit: Payer: Medicaid Other | Admitting: Obstetrics and Gynecology

## 2023-03-24 ENCOUNTER — Other Ambulatory Visit (HOSPITAL_COMMUNITY)
Admission: RE | Admit: 2023-03-24 | Discharge: 2023-03-24 | Disposition: A | Discharge status: Home / Self Care | Payer: Medicaid Other | Source: Ambulatory Visit | Attending: Obstetrics and Gynecology | Admitting: Obstetrics and Gynecology

## 2023-03-24 ENCOUNTER — Encounter: Payer: Self-pay | Admitting: Obstetrics and Gynecology

## 2023-03-24 ENCOUNTER — Other Ambulatory Visit: Payer: Self-pay

## 2023-03-24 VITALS — BP 126/71 | Wt 221.1 lb

## 2023-03-24 DIAGNOSIS — I421 Obstructive hypertrophic cardiomyopathy: Secondary | ICD-10-CM

## 2023-03-24 DIAGNOSIS — Z3A35 35 weeks gestation of pregnancy: Secondary | ICD-10-CM | POA: Insufficient documentation

## 2023-03-24 DIAGNOSIS — O0990 Supervision of high risk pregnancy, unspecified, unspecified trimester: Secondary | ICD-10-CM | POA: Insufficient documentation

## 2023-03-24 DIAGNOSIS — O99413 Diseases of the circulatory system complicating pregnancy, third trimester: Secondary | ICD-10-CM

## 2023-03-24 DIAGNOSIS — Z95810 Presence of automatic (implantable) cardiac defibrillator: Secondary | ICD-10-CM

## 2023-03-24 DIAGNOSIS — Z2839 Other underimmunization status: Secondary | ICD-10-CM

## 2023-03-24 DIAGNOSIS — Z3009 Encounter for other general counseling and advice on contraception: Secondary | ICD-10-CM

## 2023-03-24 DIAGNOSIS — Q245 Malformation of coronary vessels: Secondary | ICD-10-CM

## 2023-03-24 DIAGNOSIS — I4901 Ventricular fibrillation: Secondary | ICD-10-CM

## 2023-03-25 ENCOUNTER — Ambulatory Visit: Payer: Medicaid Other | Admitting: *Deleted

## 2023-03-25 ENCOUNTER — Ambulatory Visit: Payer: Medicaid Other | Attending: Maternal & Fetal Medicine

## 2023-03-25 VITALS — BP 120/63 | HR 69

## 2023-03-25 DIAGNOSIS — Z3A35 35 weeks gestation of pregnancy: Secondary | ICD-10-CM | POA: Diagnosis not present

## 2023-03-25 DIAGNOSIS — I421 Obstructive hypertrophic cardiomyopathy: Secondary | ICD-10-CM

## 2023-03-25 DIAGNOSIS — O3663X0 Maternal care for excessive fetal growth, third trimester, not applicable or unspecified: Secondary | ICD-10-CM

## 2023-03-25 DIAGNOSIS — O99413 Diseases of the circulatory system complicating pregnancy, third trimester: Secondary | ICD-10-CM | POA: Diagnosis not present

## 2023-03-25 DIAGNOSIS — O0990 Supervision of high risk pregnancy, unspecified, unspecified trimester: Secondary | ICD-10-CM

## 2023-03-25 DIAGNOSIS — Z362 Encounter for other antenatal screening follow-up: Secondary | ICD-10-CM | POA: Insufficient documentation

## 2023-03-25 LAB — CERVICOVAGINAL ANCILLARY ONLY
Chlamydia: NEGATIVE
Comment: NEGATIVE
Comment: NORMAL
Neisseria Gonorrhea: NEGATIVE

## 2023-03-26 LAB — STREP GP B NAA: Strep Gp B NAA: NEGATIVE

## 2023-04-05 ENCOUNTER — Other Ambulatory Visit: Payer: Self-pay

## 2023-04-05 ENCOUNTER — Ambulatory Visit: Payer: Medicaid Other | Admitting: Family Medicine

## 2023-04-05 VITALS — BP 106/73 | HR 106 | Wt 225.2 lb

## 2023-04-05 DIAGNOSIS — Z3009 Encounter for other general counseling and advice on contraception: Secondary | ICD-10-CM

## 2023-04-05 DIAGNOSIS — O99413 Diseases of the circulatory system complicating pregnancy, third trimester: Secondary | ICD-10-CM

## 2023-04-05 DIAGNOSIS — O09899 Supervision of other high risk pregnancies, unspecified trimester: Secondary | ICD-10-CM

## 2023-04-05 DIAGNOSIS — O0990 Supervision of high risk pregnancy, unspecified, unspecified trimester: Secondary | ICD-10-CM

## 2023-04-05 DIAGNOSIS — I421 Obstructive hypertrophic cardiomyopathy: Secondary | ICD-10-CM

## 2023-04-05 DIAGNOSIS — O360130 Maternal care for anti-D [Rh] antibodies, third trimester, not applicable or unspecified: Secondary | ICD-10-CM

## 2023-04-05 DIAGNOSIS — Z3A36 36 weeks gestation of pregnancy: Secondary | ICD-10-CM

## 2023-04-05 DIAGNOSIS — Z2839 Other underimmunization status: Secondary | ICD-10-CM

## 2023-04-05 DIAGNOSIS — Z95810 Presence of automatic (implantable) cardiac defibrillator: Secondary | ICD-10-CM

## 2023-04-05 DIAGNOSIS — Q245 Malformation of coronary vessels: Secondary | ICD-10-CM

## 2023-04-05 NOTE — Progress Notes (Signed)
   PRENATAL VISIT NOTE  Subjective:  Sara Lopez is a 27 y.o. G2P1001 at [redacted]w[redacted]d being seen today for ongoing prenatal care.  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  Patient reports no bleeding, no cramping, no leaking, occasional contractions, and vaginal irritation.   . Vag. Bleeding: None.  Movement: Present. Denies leaking of fluid.   The following portions of the patient's history were reviewed and updated as appropriate: allergies, current medications, past family history, past medical history, past social history, past surgical history and problem list.   Objective:   Vitals:   04/05/23 1452  BP: 106/73  Pulse: (!) 106  Weight: 225 lb 3.2 oz (102.2 kg)    Fetal Status: Fetal Heart Rate (bpm): 146   Movement: Present     General:  Alert, oriented and cooperative. Patient is in no acute distress.  Skin: Skin is warm and dry. No rash noted.   Cardiovascular: Normal heart rate noted  Respiratory: Normal respiratory effort, no problems with respiration noted  Abdomen: Soft, gravid, appropriate for gestational age.  Pain/Pressure: Present     Pelvic: Cervical exam performed in the presence of a chaperone      3/70/-3  Extremities: Normal range of motion.  Edema: None  Mental Status: Normal mood and affect. Normal behavior. Normal judgment and thought content.   Assessment and Plan:  Pregnancy: G2P1001 at [redacted]w[redacted]d 1. Supervision of high risk pregnancy, antepartum FHR and BP appropriate today Cultures collected at previous visit Continue routine prenatal  care  2. HOCM (hypertrophic obstructive cardiomyopathy) (HCC) 3. Coronary-myocardial bridge 4. 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 Tawanna Cooler Induction scheduled at 39 weeks on 12/18 Plan for passive second stage of labor Patient currently on metoprolol and compliant with this med.  5. Unwanted fertility Planning on postpartum tubal/BTS.  Papers previously signed  6. Rubella non-immune status, antepartum Postpartum MMR  7. [redacted] weeks gestation of pregnancy   Term labor symptoms and general obstetric precautions including but not limited to vaginal bleeding, contractions, leaking of fluid and fetal movement were reviewed in detail with the patient. Please refer to After Visit Summary for other counseling recommendations.   No follow-ups on file.  Future Appointments  Date Time Provider Department Center  04/08/2023 10:40 AM Tobb, Kardie, DO [facility] None  04/20/2023  7:15 AM [facility] SCHED ROOM [facility] None  05/23/2023  8:10 AM [facility] DEVICE REMOTES [facility]  08/22/2023  8:10 AM [facility] DEVICE REMOTES [facility]  11/21/2023  8:10 AM [facility] DEVICE REMOTES [facility]  02/20/2024  8:10 AM [facility] DEVICE REMOTES [facility]  05/21/2024  8:10 AM [facility] DEVICE REMOTES [facility]  08/20/2024  8:10 AM [facility] DEVICE REMOTES [facility]    Celedonio Savage, MD

## 2023-04-08 ENCOUNTER — Encounter: Payer: Self-pay | Admitting: Cardiology

## 2023-04-08 ENCOUNTER — Ambulatory Visit: Payer: Medicaid Other | Attending: Cardiology | Admitting: Cardiology

## 2023-04-08 VITALS — BP 110/71 | HR 88 | Ht 65.0 in | Wt 223.6 lb

## 2023-04-08 DIAGNOSIS — I4901 Ventricular fibrillation: Secondary | ICD-10-CM

## 2023-04-08 DIAGNOSIS — Z3A37 37 weeks gestation of pregnancy: Secondary | ICD-10-CM | POA: Diagnosis not present

## 2023-04-08 DIAGNOSIS — Z95810 Presence of automatic (implantable) cardiac defibrillator: Secondary | ICD-10-CM | POA: Diagnosis not present

## 2023-04-08 DIAGNOSIS — I519 Heart disease, unspecified: Secondary | ICD-10-CM

## 2023-04-08 DIAGNOSIS — Q245 Malformation of coronary vessels: Secondary | ICD-10-CM

## 2023-04-08 DIAGNOSIS — Z950 Presence of cardiac pacemaker: Secondary | ICD-10-CM | POA: Diagnosis not present

## 2023-04-08 DIAGNOSIS — I421 Obstructive hypertrophic cardiomyopathy: Secondary | ICD-10-CM

## 2023-04-08 NOTE — Progress Notes (Signed)
Cardio-Obstetrics Clinic  Follow Up Note   Date:  04/08/2023   ID:  Sara Lopez, DOB 01-02-1996, MRN [id number]  PCP:  Loletta Specter, PA-C   [facility] Providers Cardiologist:  Thomasene Ripple, DO  Electrophysiologist:  Sherryl Manges, MD        Referring MD: Loletta Specter, PA-C   Chief Complaint: I am doing fine  The patient is at home. I am in the office.  Virtual Visit via telephone  Note . I connected with the patient today by a telephone enabled telemedicine application and verified that I am speaking with the correct person using two identifiers.  History of Present Illness:    Sara Lopez is a 27 y.o. female [G2P1001] who returns for follow up of cardiovascular care in pregnancy. She is currently 30 weeks pregnancy.  Her medical history includes of hypertrophic cardiomyopathy diagnosed as a teenager is now status post Medtronic ICD due to ventricular arrhythmia for secondary prevention with initial ICD placement in 2011 and later change in 2020 she has rerelease follow-up with Dr. Graciela Husbands, myocardial bridging in the LAD which was done in a left heart catheterization at [facility] in 2014, former smoker.  We took care of the patient in 2022 and she was able to deliver safely vaginally on May 06, 2021 without any complications.  She is currently 37 weeks and 2 day. She denies any recent palpitations or similar episodes to a previous one, but did experience a severe headache and nausea the previous night. She is planning to have a tubal ligation after delivery due to her heart condition and does not want any more children. She has not reported any new or worsening symptoms related to her heart condition   Prior CV Studies Reviewed: The following studies were reviewed today:  Transthoracic echocardiogram May 26, 2021 IMPRESSIONS     1. There is no significant outflow tract gradient. Left ventricular  ejection fraction, by  estimation, is 65 to 70%. The left ventricle has  normal function. The left ventricle has no regional wall motion  abnormalities. There is moderate left ventricular  hypertrophy. Left ventricular diastolic parameters are consistent with  Grade I diastolic dysfunction (impaired relaxation).   2. Right ventricular systolic function is normal. The right ventricular  size is mildly enlarged. There is normal pulmonary artery systolic  pressure. The estimated right ventricular systolic pressure is 23.4 mmHg.   3. Left atrial size was moderately dilated.   4. A small pericardial effusion is present. The pericardial effusion is  circumferential. There is no evidence of cardiac tamponade.   5. Systolic motion of the mitral valve. The mitral valve is normal in  structure. No evidence of mitral valve regurgitation. No evidence of  mitral stenosis.   6. The aortic valve is normal in structure. Aortic valve regurgitation is  mild. No aortic stenosis is present.   7. Pulmonic valve regurgitation is moderate.   8. The inferior vena cava is normal in size with greater than 50%  respiratory variability, suggesting right atrial pressure of 3 mmHg.   Comparison(s): No significant change from prior study. Prior images  reviewed side by side.   Conclusion(s)/Recommendation(s): Findings consistent with hypertrophic  cardiomyopathy.   FINDINGS   Left Ventricle: There is no significant outflow tract gradient. Left  ventricular ejection fraction, by estimation, is 65 to 70%. The left  ventricle has normal function. The left ventricle has no regional wall  motion abnormalities. The left ventricular  internal  cavity size was normal in size. There is moderate left  ventricular hypertrophy. Left ventricular diastolic parameters are  consistent with Grade I diastolic dysfunction (impaired relaxation).   Right Ventricle: The right ventricular size is mildly enlarged. No  increase in right ventricular wall  thickness. Right ventricular systolic  function is normal. There is normal pulmonary artery systolic pressure.  The tricuspid regurgitant velocity is 2.26   m/s, and with an assumed right atrial pressure of 3 mmHg, the estimated  right ventricular systolic pressure is 23.4 mmHg.   Left Atrium: Left atrial size was moderately dilated.   Right Atrium: Right atrial size was normal in size.   Pericardium: A small pericardial effusion is present. The pericardial  effusion is circumferential. There is no evidence of cardiac tamponade.   Mitral Valve: Systolic motion of the mitral valve. The mitral valve is  normal in structure. No evidence of mitral valve regurgitation. No  evidence of mitral valve stenosis.   Tricuspid Valve: The tricuspid valve is normal in structure. Tricuspid  valve regurgitation is trivial. No evidence of tricuspid stenosis.   Aortic Valve: The aortic valve is normal in structure. Aortic valve  regurgitation is mild. Aortic regurgitation PHT measures 573 msec. No  aortic stenosis is present.   Pulmonic Valve: The pulmonic valve was normal in structure. Pulmonic valve  regurgitation is moderate. No evidence of pulmonic stenosis.   Aorta: The aortic root is normal in size and structure.   Venous: The inferior vena cava is normal in size with greater than 50%  respiratory variability, suggesting right atrial pressure of 3 mmHg.   IAS/Shunts: No atrial level shunt detected by color flow Doppler.   Additional Comments: A device lead is visualized in the right ventricle.   Past Medical History:  Diagnosis Date   ADHD    ADHD (attention deficit hyperactivity disorder) 07/21/2011   Cardiac pacemaker in situ 03/27/2018   Coronary-myocardial bridge    LHC at [facility] 2/14: LAD mid myocardial bridge (patent in diastole and near complete occlusion distally) // Myoview 2/14: Inferior attenuation, no ischemia, normal EF   Depression    Development delay    Developmental  delay 07/21/2011   History of cardiac arrest 09/21/2016   History of echocardiogram 08/2016   Echo 5/18: severe asymmetric septal hypertrophy, EF 60-65, dynamic obstruction at rest, peak LVOT 328 cm/sec and peak gradient 43 mmHg, no RWMA, septal thickness 24.6 mm, post wall thickness 15.5 mm, +SAM, mild MR, severe LAE, PASP 30   History of echocardiogram 1.2014 at [facility]   Echo 1/14: Hypertrophic CM, mild dynamic LVOT - peak 27 mmHg, chordal SAM with LVOT obstruction, mild MR, dilate LCA, abnormal LV diastolic function, hyperdynamic LV systolic function    History of MI (myocardial infarction) 09/16/2016   HOCM (hypertrophic obstructive cardiomyopathy) (HCC)    ICD (implantable cardioverter-defibrillator) in place    Medtronic model D334DRM (Protect), serial number [id number], implanted 04/30/2010    Myocardial infarction (HCC)    hx of elevated Tn levels // LHC in 2014 at [facility] with myocardial bridging   Postpartum visit 06/09/2021   Red Chart Rounds Patient 12/19/2020   Red Chart Rounds Patient Care Plan     [place] [employer] [place]  G1P0  Current GA: [redacted]w[redacted]d          OB History  Gravida  Para  Term  Preterm  AB  Living  1  0  0  0  0  0  SAB  IAB  Ectopic  Multiple  Live Births  0  0  0  0  0        Patient encounter date not specified.        Red Chart Rounds Diagnosis: HOCM s/p remote cardiac arrest with ICD in place, coronary-myocardial bridge     Consultants: Cards,    Single implantable cardioverter-defibrillator (ICD) in situ 07/21/2011   VF (ventricular fibrillation) (HCC)    admitted in 2011 with VF arrest >> dx with HOCM // s/p ICD    Past Surgical History:  Procedure Laterality Date   ICD GENERATOR CHANGEOUT N/A 05/10/2018   Procedure: ICD GENERATOR CHANGEOUT;  Surgeon: Duke Salvia, MD;  Location: [facility] INVASIVE CV LAB;  Service: Cardiovascular;  Laterality: N/A;   ICD IMPLANT  2011      OB History     Gravida  2   Para  1   Term  1   Preterm      AB      Living  1      SAB       IAB      Ectopic      Multiple  0   Live Births  1               Current Medications: Current Meds  Medication Sig   metoprolol succinate (TOPROL XL) 25 MG 24 hr tablet Take 0.5 tablets (12.5 mg total) by mouth daily.   pantoprazole (PROTONIX) 20 MG tablet Take 1 tablet (20 mg total) by mouth daily.   Prenatal Vit-Fe Fumarate-FA (MULTIVITAMIN-PRENATAL) 27-0.8 MG TABS tablet Take 1 tablet by mouth daily at 12 noon.     Allergies:   Patient has no known allergies.   Social History   Socioeconomic History   Marital status: Single    Spouse name: Not on file   Number of children: Not on file   Years of education: Not on file   Highest education level: Not on file  Occupational History   Occupation: Conservation officer, nature  Tobacco Use   Smoking status: Former    Current packs/day: 0.00    Types: Cigarettes    Quit date: 07/01/2020    Years since quitting: 2.7   Smokeless tobacco: Former  Building services engineer status: Former   Substances: Nicotine   Devices: not since preg 06/2020  Substance and Sexual Activity   Alcohol use: Not Currently    Alcohol/week: 1.0 standard drink of alcohol    Types: 1 Glasses of wine per week    Comment: not since preg   Drug use: No    Types: Marijuana    Comment: no drugs since 2011   Sexual activity: Not Currently    Birth control/protection: None, Pill  Other Topics Concern   Not on file  Social History Narrative   Trying to get a job with [employer]    Currently working as Conservation officer, 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   Single    No kids   Social Determinants of Corporate investment banker Strain: Not on file  Food Insecurity: No Food Insecurity (04/23/2021)   Hunger Vital Sign    Worried About Running Out of Food in the Last Year: Never true    Ran Out of Food in the Last Year: Never true  Recent Concern: Food Insecurity - Food Insecurity Present (03/11/2021)   Hunger Vital Sign    Worried About Running Out of Food in the Last  Year: Sometimes true  Ran Out of Food in the Last Year: Never true  Transportation Needs: No Transportation Needs (04/23/2021)   PRAPARE - Administrator, Civil Service (Medical): No    Lack of Transportation (Non-Medical): No  Physical Activity: Not on file  Stress: Not on file  Social Connections: Not on file      Family History  Adopted: Yes      ROS:   Please see the history of present illness.     All other systems reviewed and are negative.   Labs/EKG Reviewed:    EKG:   EKG is was not ordered today.    Recent Labs: 02/03/2023: Hemoglobin 11.5; Platelets 188   Recent Lipid Panel No results found for: "CHOL", "TRIG", "HDL", "CHOLHDL", "LDLCALC", "LDLDIRECT"  Physical Exam:    VS:  BP 110/71 (BP Location: Right Arm, Patient Position: Sitting, Cuff Size: Normal)   Pulse 88   Ht 5\' 5"  (1.651 m)   Wt 223 lb 9.6 oz (101.4 kg)   SpO2 98%   BMI 37.21 kg/m     Wt Readings from Last 3 Encounters:  04/08/23 223 lb 9.6 oz (101.4 kg)  04/05/23 225 lb 3.2 oz (102.2 kg)  03/24/23 221 lb 1.6 oz (100.3 kg)   Virtual visit no physical exam performed.   Risk Assessment/Risk Calculators:                 ASSESSMENT & PLAN:    Hypertrophic obstructive cardiomyopathy History of cardiac arrest ICD in situ-Medtronics Presyncope [redacted] weeks gestation   So far her pregnancy has been uneventful. I anticipate safe vaginal delivery. This will be her second vaginal delivery. Hoping her second stage of labor is not long considering this is her second vaginal deliver -however to avoid prolonged vaginal delivery - discussed with OB team low threshold for vacuumed assisted delivery.   Since starting her low dose beta blocker , PVCs has improved. Reviewed recent interrogation.   Explained to the patient the most important thing during the pregnancy with her HCM is to make sure that she is not dehydrated to continue to increase her hydration, also if she does become  short of breath that continues to worsen while on her low dose Toprol to notify my office immediately.   She is scheduled for induction on 12/18 - I will dicussed with our inpatient service teams that week.      Patient Instructions  Medication Instructions:  Your physician recommends that you continue on your current medications as directed. Please refer to the Current Medication list given to you today.  *If you need a refill on your cardiac medications before your next appointment, please call your pharmacy*  Follow-Up: At [facility], you and your health needs are our priority.  As part of our continuing mission to provide you with exceptional heart care, we have created designated Provider Care Teams.  These Care Teams include your primary Cardiologist (physician) and Advanced Practice Providers (APPs -  Physician Assistants and Nurse Practitioners) who all work together to provide you with the care you need, when you need it.  Your next appointment:   6 week(s) - overbook okay  Provider:   Thomasene Ripple, DO    Dispo:  No follow-ups on file.   Medication Adjustments/Labs and Tests Ordered: Current medicines are reviewed at length with the patient today.  Concerns regarding medicines are outlined above.  Tests Ordered: No orders of the defined types were placed in this encounter.  Medication  Changes: No orders of the defined types were placed in this encounter.

## 2023-04-08 NOTE — Patient Instructions (Signed)
Medication Instructions:  Your physician recommends that you continue on your current medications as directed. Please refer to the Current Medication list given to you today.  *If you need a refill on your cardiac medications before your next appointment, please call your pharmacy*  Follow-Up: At [facility], you and your health needs are our priority.  As part of our continuing mission to provide you with exceptional heart care, we have created designated Provider Care Teams.  These Care Teams include your primary Cardiologist (physician) and Advanced Practice Providers (APPs -  Physician Assistants and Nurse Practitioners) who all work together to provide you with the care you need, when you need it.  Your next appointment:   6 week(s) - overbook okay  Provider:   Thomasene Ripple, DO

## 2023-04-13 ENCOUNTER — Encounter: Payer: Self-pay | Admitting: Family Medicine

## 2023-04-13 ENCOUNTER — Other Ambulatory Visit: Payer: Self-pay

## 2023-04-13 ENCOUNTER — Ambulatory Visit: Payer: Medicaid Other | Admitting: Family Medicine

## 2023-04-13 ENCOUNTER — Telehealth (HOSPITAL_COMMUNITY): Payer: Self-pay | Admitting: *Deleted

## 2023-04-13 ENCOUNTER — Other Ambulatory Visit: Payer: Self-pay | Admitting: Advanced Practice Midwife

## 2023-04-13 VITALS — BP 109/77 | HR 89 | Wt 226.0 lb

## 2023-04-13 DIAGNOSIS — Z3009 Encounter for other general counseling and advice on contraception: Secondary | ICD-10-CM

## 2023-04-13 DIAGNOSIS — O0990 Supervision of high risk pregnancy, unspecified, unspecified trimester: Secondary | ICD-10-CM

## 2023-04-13 DIAGNOSIS — Z950 Presence of cardiac pacemaker: Secondary | ICD-10-CM

## 2023-04-13 DIAGNOSIS — Z95810 Presence of automatic (implantable) cardiac defibrillator: Secondary | ICD-10-CM

## 2023-04-13 DIAGNOSIS — I421 Obstructive hypertrophic cardiomyopathy: Secondary | ICD-10-CM

## 2023-04-13 DIAGNOSIS — O99413 Diseases of the circulatory system complicating pregnancy, third trimester: Secondary | ICD-10-CM

## 2023-04-13 DIAGNOSIS — Z3A38 38 weeks gestation of pregnancy: Secondary | ICD-10-CM

## 2023-04-13 NOTE — Telephone Encounter (Signed)
Preadmission screen  

## 2023-04-13 NOTE — Progress Notes (Signed)
   PRENATAL VISIT NOTE  Subjective:  Sara Lopez is a 27 y.o. G2P1001 at [redacted]w[redacted]d being seen today for ongoing prenatal care.  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  Patient reports no complaints.  Contractions: Irritability. Vag. Bleeding: None.  Movement: Present. Denies leaking of fluid.   The following portions of the patient's history were reviewed and updated as appropriate: allergies, current medications, past family history, past medical history, past social history, past surgical history and problem list.   Objective:   Vitals:   04/13/23 1527  BP: 109/77  Pulse: 89  Weight: 226 lb (102.5 kg)    Fetal Status: Fetal Heart Rate (bpm): 144   Movement: Present     General:  Alert, oriented and cooperative. Patient is in no acute distress.  Skin: Skin is warm and dry. No rash noted.   Cardiovascular: Normal heart rate noted  Respiratory: Normal respiratory effort, no problems with respiration noted  Abdomen: Soft, gravid, appropriate for gestational age.  Pain/Pressure: Present     Pelvic: Cervical exam deferred        Extremities: Normal range of motion.  Edema: None  Mental Status: Normal mood and affect. Normal behavior. Normal judgment and thought content.   Assessment and Plan:  Pregnancy: G2P1001 at [redacted]w[redacted]d 1. HOCM (hypertrophic obstructive cardiomyopathy) (HCC) With myocardial bridge  2. Cardiac pacemaker in situ  3. Supervision of high risk pregnancy, antepartum REDCHART patient Reviewed plan with patient for  A-Line, 2 peripheral IVs, ICU recovery etc.  She is prepared for labor Reports vigorous movement Minimal contractions Was 4cm last check and declined today Reviewed when to go to the hospital  4. 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 Unwanted fertility Confirmed plan for BTS  Preterm labor symptoms and general obstetric precautions including but not limited to vaginal bleeding, contractions, leaking of fluid and fetal movement were reviewed in detail with the patient. Please refer to After Visit Summary for other counseling recommendations.   No follow-ups on file.  Future Appointments  Date Time Provider Department Center  04/20/2023  7:15 AM [facility] SCHED ROOM [facility] None  05/20/2023 10:40 AM Tobb, Kardie, DO [facility] None  05/23/2023  8:10 AM [facility] DEVICE REMOTES [facility]  08/22/2023  8:10 AM [facility] DEVICE REMOTES [facility]  11/21/2023  8:10 AM [facility] DEVICE REMOTES [facility]  02/20/2024  8:10 AM [facility] DEVICE REMOTES [facility]  05/21/2024  8:10 AM [facility] DEVICE REMOTES [facility]  08/20/2024  8:10 AM [facility] DEVICE REMOTES [facility]    Federico Flake, MD

## 2023-04-15 ENCOUNTER — Encounter (HOSPITAL_COMMUNITY): Payer: Self-pay | Admitting: *Deleted

## 2023-04-15 ENCOUNTER — Telehealth (HOSPITAL_COMMUNITY): Payer: Self-pay | Admitting: *Deleted

## 2023-04-15 NOTE — Telephone Encounter (Signed)
Preadmission screen  

## 2023-04-17 ENCOUNTER — Other Ambulatory Visit: Payer: Self-pay | Admitting: Advanced Practice Midwife

## 2023-04-17 DIAGNOSIS — Z8679 Personal history of other diseases of the circulatory system: Secondary | ICD-10-CM

## 2023-04-19 ENCOUNTER — Inpatient Hospital Stay (HOSPITAL_COMMUNITY)
Admission: AD | Admit: 2023-04-19 | Discharge: 2023-04-19 | Disposition: A | Discharge status: Home / Self Care | Payer: Medicaid Other | Attending: Obstetrics & Gynecology | Admitting: Obstetrics & Gynecology

## 2023-04-19 ENCOUNTER — Encounter (HOSPITAL_COMMUNITY): Payer: Self-pay | Admitting: Obstetrics & Gynecology

## 2023-04-19 ENCOUNTER — Other Ambulatory Visit: Payer: Self-pay

## 2023-04-19 DIAGNOSIS — R0781 Pleurodynia: Secondary | ICD-10-CM | POA: Insufficient documentation

## 2023-04-19 DIAGNOSIS — Z3A38 38 weeks gestation of pregnancy: Secondary | ICD-10-CM | POA: Insufficient documentation

## 2023-04-19 DIAGNOSIS — O99419 Diseases of the circulatory system complicating pregnancy, unspecified trimester: Secondary | ICD-10-CM | POA: Insufficient documentation

## 2023-04-19 DIAGNOSIS — R1030 Lower abdominal pain, unspecified: Secondary | ICD-10-CM | POA: Insufficient documentation

## 2023-04-19 DIAGNOSIS — R0789 Other chest pain: Secondary | ICD-10-CM | POA: Diagnosis not present

## 2023-04-19 DIAGNOSIS — I421 Obstructive hypertrophic cardiomyopathy: Secondary | ICD-10-CM | POA: Diagnosis not present

## 2023-04-19 DIAGNOSIS — M546 Pain in thoracic spine: Secondary | ICD-10-CM | POA: Diagnosis not present

## 2023-04-19 DIAGNOSIS — Z950 Presence of cardiac pacemaker: Secondary | ICD-10-CM

## 2023-04-19 DIAGNOSIS — M940 Chondrocostal junction syndrome [Tietze]: Secondary | ICD-10-CM | POA: Insufficient documentation

## 2023-04-19 DIAGNOSIS — Z3689 Encounter for other specified antenatal screening: Secondary | ICD-10-CM

## 2023-04-19 DIAGNOSIS — O0990 Supervision of high risk pregnancy, unspecified, unspecified trimester: Secondary | ICD-10-CM

## 2023-04-19 DIAGNOSIS — Q245 Malformation of coronary vessels: Secondary | ICD-10-CM | POA: Diagnosis not present

## 2023-04-19 DIAGNOSIS — O26893 Other specified pregnancy related conditions, third trimester: Secondary | ICD-10-CM | POA: Insufficient documentation

## 2023-04-19 DIAGNOSIS — Z95810 Presence of automatic (implantable) cardiac defibrillator: Secondary | ICD-10-CM | POA: Diagnosis not present

## 2023-04-19 LAB — URINALYSIS, ROUTINE W REFLEX MICROSCOPIC
Bilirubin Urine: NEGATIVE
Glucose, UA: NEGATIVE mg/dL
Hgb urine dipstick: NEGATIVE
Ketones, ur: NEGATIVE mg/dL
Nitrite: NEGATIVE
Protein, ur: NEGATIVE mg/dL
Specific Gravity, Urine: 1.021 (ref 1.005–1.030)
pH: 5 (ref 5.0–8.0)

## 2023-04-19 MED ORDER — ACETAMINOPHEN 500 MG PO TABS
1000.0000 mg | ORAL_TABLET | Freq: Once | ORAL | Status: AC
  Administered 2023-04-19: 1000 mg via ORAL
  Filled 2023-04-19: qty 2

## 2023-04-19 MED ORDER — CYCLOBENZAPRINE HCL 5 MG PO TABS
10.0000 mg | ORAL_TABLET | Freq: Once | ORAL | Status: AC
  Administered 2023-04-19: 10 mg via ORAL
  Filled 2023-04-19: qty 2

## 2023-04-19 MED ORDER — CYCLOBENZAPRINE HCL 10 MG PO TABS: 10.0000 mg | ORAL_TABLET | Freq: Two times a day (BID) | ORAL | 0 refills | Status: AC | PRN

## 2023-04-19 NOTE — Discharge Instructions (Signed)
You were seen in the maternity assessment unit for posterior left side and back pain.  You are found to have a tightening muscles cultures paraspinous muscle specifically some point tenderness over your ninth and 10th rib.  This is likely related to a muscle spasm.  You were given a muscle relaxer called Flexeril and this helped your pain.  You are also given Tylenol which helped with this pain.  Prescription was sent in for you to continue the Flexeril if you need to and you can always take Tylenol 1000 mg every 6 hours as.  You will have your induction of labor tomorrow morning.  They will call you as early as 5 AM to have you come into the hospital.  Please return to the maternity assessment unit if you have concerns, pain, chest pain, shortness of breath or any other concern about your health with a healthier baby.

## 2023-04-19 NOTE — MAU Provider Note (Signed)
History     CSN: [id number] Arrival date and time: 04/19/23 1524   Event Date/Time   First Provider Initiated Contact with Patient 04/19/23 1630      Chief Complaint  Patient presents with   Back Pain   Abdominal Pain Pertinent negatives include no diarrhea, dysuria, fever, nausea or vomiting.  Patient is 27 y.o. G2P1001 [redacted]w[redacted]d here with complaints of posterior left lower rib pain that started after taking a nap with her dog.  She reports that she thinks she may have slept in a "weird position" she denies any midsternal chest pain denies any ICD shocks from her defibrillator she does not endorse shortness of breath or any cardiac symptoms.  She feels this pain on the left posterior chest wall mostly when she moves she can feel it when she takes a deep breath she is able to provoke the pain with touch to the side and posterior aspect of her left rib cage.  She reports that the pain does come and travel around and down to her lower abdomen.  She does report intermittent tightening of her abdomen.  She was checked several weeks ago and was already 4 cm.  +FM, denies LOF, VB, contractions, vaginal discharge.   OB History     Gravida  2   Para  1   Term  1   Preterm      AB      Living  1      SAB      IAB      Ectopic      Multiple  0   Live Births  1           Past Medical History:  Diagnosis Date   ADHD    ADHD (attention deficit hyperactivity disorder) 07/21/2011   Cardiac pacemaker in situ 03/27/2018   Coronary-myocardial bridge    LHC at [facility] 2/14: LAD mid myocardial bridge (patent in diastole and near complete occlusion distally) // Myoview 2/14: Inferior attenuation, no ischemia, normal EF   Depression    Development delay    Developmental delay 07/21/2011   History of cardiac arrest 09/21/2016   History of echocardiogram 08/2016   Echo 5/18: severe asymmetric septal hypertrophy, EF 60-65, dynamic obstruction at rest, peak LVOT 328 cm/sec and peak  gradient 43 mmHg, no RWMA, septal thickness 24.6 mm, post wall thickness 15.5 mm, +SAM, mild MR, severe LAE, PASP 30   History of echocardiogram 1.2014 at [facility]   Echo 1/14: Hypertrophic CM, mild dynamic LVOT - peak 27 mmHg, chordal SAM with LVOT obstruction, mild MR, dilate LCA, abnormal LV diastolic function, hyperdynamic LV systolic function    History of MI (myocardial infarction) 09/16/2016   HOCM (hypertrophic obstructive cardiomyopathy) (HCC)    ICD (implantable cardioverter-defibrillator) in place    Medtronic model D334DRM (Protect), serial number [id number], implanted 04/30/2010    Myocardial infarction (HCC)    hx of elevated Tn levels // LHC in 2014 at [facility] with myocardial bridging   Postpartum visit 06/09/2021   Red Chart Rounds Patient 12/19/2020   Red Chart Rounds Patient Care Plan     [place]  G1P0  Current GA: [redacted]w[redacted]d          OB History  Gravida  Para  Term  Preterm  AB  Living  1  0  0  0  0  0  SAB  IAB  Ectopic  Multiple  Live Births  0  0  0  0  0        Patient encounter date not specified.        Red Chart Rounds Diagnosis: HOCM s/p remote cardiac arrest with ICD in place, coronary-myocardial bridge     Consultants: Cards,    Single implantable cardioverter-defibrillator (ICD) in situ 07/21/2011   VF (ventricular fibrillation) (HCC)    admitted in 2011 with VF arrest >> dx with HOCM // s/p ICD    Past Surgical History:  Procedure Laterality Date   ICD GENERATOR CHANGEOUT N/A 05/10/2018   Procedure: ICD GENERATOR CHANGEOUT;  Surgeon: Duke Salvia, MD;  Location: [facility] INVASIVE CV LAB;  Service: Cardiovascular;  Laterality: N/A;   ICD IMPLANT  2011    Family History  Adopted: Yes    Social History   Tobacco Use   Smoking status: Former    Current packs/day: 0.00    Types: Cigarettes    Quit date: 07/01/2020    Years since quitting: 2.8   Smokeless tobacco: Former  Building services engineer status: Former   Substances: Nicotine   Devices: not since preg  06/2020  Substance Use Topics   Alcohol use: Not Currently    Alcohol/week: 1.0 standard drink of alcohol    Types: 1 Glasses of wine per week    Comment: not since preg   Drug use: No    Types: Marijuana    Comment: no drugs since 2011    Allergies: No Known Allergies  Medications Prior to Admission  Medication Sig Dispense Refill Last Dose/Taking   metoprolol succinate (TOPROL XL) 25 MG 24 hr tablet Take 0.5 tablets (12.5 mg total) by mouth daily. 45 tablet 3 Past Week   pantoprazole (PROTONIX) 20 MG tablet Take 1 tablet (20 mg total) by mouth daily. 30 tablet 22 Past Week   Prenatal Vit-Fe Fumarate-FA (MULTIVITAMIN-PRENATAL) 27-0.8 MG TABS tablet Take 1 tablet by mouth daily at 12 noon.   04/19/2023    Review of Systems  Constitutional:  Negative for chills and fever.  HENT:  Negative for congestion and sore throat.   Eyes:  Negative for pain and visual disturbance.  Respiratory:  Negative for cough, chest tightness and shortness of breath.   Cardiovascular:  Negative for chest pain.  Gastrointestinal:  Negative for abdominal pain, diarrhea, nausea and vomiting.  Endocrine: Negative for cold intolerance and heat intolerance.  Genitourinary:  Negative for dysuria and flank pain.  Musculoskeletal:  Positive for back pain.  Skin:  Negative for rash.  Allergic/Immunologic: Negative for food allergies.  Neurological:  Negative for dizziness and light-headedness.  Psychiatric/Behavioral:  Negative for agitation.    Physical Exam   Blood pressure 110/72, pulse 95, temperature 97.6 F (36.4 C), temperature source Oral, resp. rate 18, height 5\' 5"  (1.651 m), weight 103.2 kg, SpO2 97%, currently breastfeeding.  Physical Exam Vitals and nursing note reviewed.  Constitutional:      General: She is not in acute distress.    Appearance: She is well-developed.     Comments: Pregnant female  HENT:     Head: Normocephalic and atraumatic.  Eyes:     General: No scleral icterus.     Conjunctiva/sclera: Conjunctivae normal.  Cardiovascular:     Rate and Rhythm: Normal rate.  Pulmonary:     Effort: Pulmonary effort is normal.  Chest:     Chest wall: No tenderness.  Abdominal:     Palpations: Abdomen is soft.     Tenderness: There is no abdominal tenderness. There is no  guarding or rebound.     Comments: Gravid  Genitourinary:    Vagina: Normal.  Musculoskeletal:        General: Normal range of motion.       Arms:     Cervical back: Normal range of motion and neck supple.  Skin:    General: Skin is warm and dry.     Findings: No rash.  Neurological:     Mental Status: She is alert and oriented to person, place, and time.    Dilation: 4.5 Effacement (%): 50 Station: Ballotable Exam by:: Lyndel Safe, MD  MAU Course  Procedures I reviewed the patient's fetal monitoring.  Baseline HR: 140 Variability:  moderate Accels:present Decels: none  A/P: Reactive NST  Reassured regarding fetal status.   MDM: mild  This patient presents to the ED for concern of   Chief Complaint  Patient presents with   Back Pain     These complains involves an extensive number of treatment options, and is a complaint that carries with it a high risk of complications and morbidity.  The differential diagnosis for  1. Back pain INCLUDES likely MSK in nature, no cardiac sx. patient had point tenderness over the ninth rib with some radiation appropriate referral number to the front of her.  There was spasm of the paraspinal muscles on the left.   Co morbidities that complicate the patient evaluation: Patient Active Problem List   Diagnosis Date Noted   ICD (implantable cardioverter-defibrillator) in place - MDT 01/05/2023   Rubella non-immune status, antepartum 11/18/2022   Unwanted fertility 11/16/2022   Supervision of high risk pregnancy, antepartum 11/09/2022   Red Chart Rounds Patient 12/19/2020   Cardiac pacemaker in situ 03/27/2018   Heart disease 03/27/2018    HOCM (hypertrophic obstructive cardiomyopathy) (HCC) 09/21/2016   Coronary-myocardial bridge    VF (ventricular fibrillation) (HCC) 07/21/2011     Additional history obtained from partner  Interpreter services used: no  External records from outside source obtained and reviewed including CareEverywhere and Prenatal care records  Lab Tests: UA  I ordered, and personally interpreted labs.  The pertinent results include:   Results for orders placed or performed during the hospital encounter of 04/19/23 (from the past 24 hours)  Urinalysis, Routine w reflex microscopic -Urine, Clean Catch     Status: Abnormal   Collection Time: 04/19/23  5:50 PM  Result Value Ref Range   Color, Urine YELLOW YELLOW   APPearance HAZY (A) CLEAR   Specific Gravity, Urine 1.021 1.005 - 1.030   pH 5.0 5.0 - 8.0   Glucose, UA NEGATIVE NEGATIVE mg/dL   Hgb urine dipstick NEGATIVE NEGATIVE   Bilirubin Urine NEGATIVE NEGATIVE   Ketones, ur NEGATIVE NEGATIVE mg/dL   Protein, ur NEGATIVE NEGATIVE mg/dL   Nitrite NEGATIVE NEGATIVE   Leukocytes,Ua SMALL (A) NEGATIVE   RBC / HPF 0-5 0 - 5 RBC/hpf   WBC, UA 6-10 0 - 5 WBC/hpf   Bacteria, UA RARE (A) NONE SEEN   Squamous Epithelial / HPF 6-10 0 - 5 /HPF   Mucus PRESENT     Cardiac Testing/Monitoring: Despite the patient's extensive cardiac history I do not feel that an EKG is warranted at this time.  The patient does not report any midsternal chest pain.  Medicines ordered and prescription drug management:  Medications: flexeril and tylenol   Reevaluation of the patient after these medicines showed that the patient improved I have reviewed the patients home medicines and have made adjustments as  needed  Test Considered: CXR Troponin   MAU Course: Patient was much improved after Tylenol in the throat.  After the interventions noted above, I reevaluated the patient and found that they have :improved  Dispostion: discharged    Assessment and Plan    1. Acute left-sided thoracic back pain   2. Supervision of high risk pregnancy, antepartum   3. Costochondritis   4. [redacted] weeks gestation of pregnancy   5. NST (non-stress test) reactive   6. HOCM (hypertrophic obstructive cardiomyopathy) (HCC)   7. Coronary-myocardial bridge   8. Cardiac pacemaker in situ    -Rx for Flexeril sent to pharmacy -Continue OTC Tylenol -Follow-up tomorrow for induction of labor -Return precautions given to patient.  Isa Rankin [facility] 04/19/2023, 6:24 PM   Future Appointments  Date Time Provider Department Center  04/20/2023  7:15 AM [facility] SCHED ROOM [facility] None  05/20/2023 10:40 AM Tobb, Lavona [place], DO [facility] None  05/23/2023  8:10 AM [facility] DEVICE REMOTES [facility]  08/22/2023  8:10 AM [facility] DEVICE REMOTES [facility]  11/21/2023  8:10 AM [facility] DEVICE REMOTES [facility]  02/20/2024  8:10 AM [facility] DEVICE REMOTES [facility]  05/21/2024  8:10 AM [facility] DEVICE REMOTES [facility]  08/20/2024  8:10 AM [facility] DEVICE REMOTES [facility]    Allergies as of 04/19/2023   No Known Allergies      Medication List     TAKE these medications    cyclobenzaprine 10 MG tablet Commonly known as: FLEXERIL Take 1 tablet (10 mg total) by mouth 2 (two) times daily as needed for muscle spasms.   metoprolol succinate 25 MG 24 hr tablet Commonly known as: Toprol XL Take 0.5 tablets (12.5 mg total) by mouth daily.   multivitamin-prenatal 27-0.8 MG Tabs tablet Take 1 tablet by mouth daily at 12 noon.   pantoprazole 20 MG tablet Commonly known as: Protonix Take 1 tablet (20 mg total) by mouth daily.

## 2023-04-19 NOTE — MAU Note (Signed)
Sara Lopez is a 27 y.o. at [redacted]w[redacted]d here in MAU reporting: she's having sharp stabbing pain on left back rib area and abdominal tightness.  Denies VB or LOF.  Endorses +FM, less than usual. Scheduled for IOL tomorrow secondary cardiac issues LMP: NA Onset of complaint: last night Pain score: 7 Vitals:   04/19/23 1553  BP: 110/72  Pulse: 95  Resp: 18  Temp: 97.6 F (36.4 C)  SpO2: 97%     FHT:149 bpm FM audible Lab orders placed from triage:   None

## 2023-04-20 ENCOUNTER — Encounter: Payer: Self-pay | Admitting: General Practice

## 2023-04-20 ENCOUNTER — Inpatient Hospital Stay (HOSPITAL_COMMUNITY): Payer: Medicaid Other

## 2023-04-20 ENCOUNTER — Other Ambulatory Visit: Payer: Self-pay

## 2023-04-20 ENCOUNTER — Inpatient Hospital Stay (HOSPITAL_COMMUNITY)
Admission: RE | Admit: 2023-04-20 | Discharge: 2023-04-23 | DRG: 787 | Disposition: A | Discharge status: Home / Self Care | Payer: Medicaid Other | Attending: Obstetrics & Gynecology | Admitting: Obstetrics & Gynecology

## 2023-04-20 ENCOUNTER — Encounter (HOSPITAL_COMMUNITY)
Admission: RE | Disposition: A | Discharge status: Home / Self Care | Payer: Self-pay | Source: Home / Self Care | Attending: Obstetrics and Gynecology

## 2023-04-20 ENCOUNTER — Inpatient Hospital Stay (HOSPITAL_COMMUNITY): Payer: Medicaid Other | Admitting: Anesthesiology

## 2023-04-20 ENCOUNTER — Encounter (HOSPITAL_COMMUNITY): Payer: Self-pay | Admitting: Family Medicine

## 2023-04-20 DIAGNOSIS — I422 Other hypertrophic cardiomyopathy: Secondary | ICD-10-CM

## 2023-04-20 DIAGNOSIS — O99284 Endocrine, nutritional and metabolic diseases complicating childbirth: Secondary | ICD-10-CM | POA: Diagnosis present

## 2023-04-20 DIAGNOSIS — Z8679 Personal history of other diseases of the circulatory system: Secondary | ICD-10-CM | POA: Diagnosis not present

## 2023-04-20 DIAGNOSIS — Z3A39 39 weeks gestation of pregnancy: Secondary | ICD-10-CM | POA: Diagnosis not present

## 2023-04-20 DIAGNOSIS — Z23 Encounter for immunization: Secondary | ICD-10-CM | POA: Diagnosis not present

## 2023-04-20 DIAGNOSIS — R000 Tachycardia, unspecified: Secondary | ICD-10-CM | POA: Diagnosis not present

## 2023-04-20 DIAGNOSIS — Z302 Encounter for sterilization: Secondary | ICD-10-CM | POA: Diagnosis not present

## 2023-04-20 DIAGNOSIS — O9942 Diseases of the circulatory system complicating childbirth: Principal | ICD-10-CM | POA: Diagnosis present

## 2023-04-20 DIAGNOSIS — O99214 Obesity complicating childbirth: Secondary | ICD-10-CM | POA: Diagnosis not present

## 2023-04-20 DIAGNOSIS — Z79899 Other long term (current) drug therapy: Secondary | ICD-10-CM | POA: Diagnosis not present

## 2023-04-20 DIAGNOSIS — Z95810 Presence of automatic (implantable) cardiac defibrillator: Secondary | ICD-10-CM

## 2023-04-20 DIAGNOSIS — Z87891 Personal history of nicotine dependence: Secondary | ICD-10-CM

## 2023-04-20 DIAGNOSIS — O9081 Anemia of the puerperium: Secondary | ICD-10-CM | POA: Diagnosis not present

## 2023-04-20 DIAGNOSIS — Z3A00 Weeks of gestation of pregnancy not specified: Secondary | ICD-10-CM | POA: Diagnosis not present

## 2023-04-20 DIAGNOSIS — D649 Anemia, unspecified: Secondary | ICD-10-CM | POA: Diagnosis not present

## 2023-04-20 DIAGNOSIS — Z3490 Encounter for supervision of normal pregnancy, unspecified, unspecified trimester: Secondary | ICD-10-CM

## 2023-04-20 DIAGNOSIS — I959 Hypotension, unspecified: Secondary | ICD-10-CM | POA: Diagnosis not present

## 2023-04-20 DIAGNOSIS — I421 Obstructive hypertrophic cardiomyopathy: Secondary | ICD-10-CM | POA: Diagnosis present

## 2023-04-20 DIAGNOSIS — O0990 Supervision of high risk pregnancy, unspecified, unspecified trimester: Secondary | ICD-10-CM

## 2023-04-20 DIAGNOSIS — O903 Peripartum cardiomyopathy: Secondary | ICD-10-CM | POA: Diagnosis not present

## 2023-04-20 DIAGNOSIS — N838 Other noninflammatory disorders of ovary, fallopian tube and broad ligament: Secondary | ICD-10-CM | POA: Diagnosis not present

## 2023-04-20 LAB — CBC
HCT: 30.5 % — ABNORMAL LOW (ref 36.0–46.0)
Hemoglobin: 9.9 g/dL — ABNORMAL LOW (ref 12.0–15.0)
MCH: 27.9 pg (ref 26.0–34.0)
MCHC: 32.5 g/dL (ref 30.0–36.0)
MCV: 85.9 fL (ref 80.0–100.0)
Platelets: 203 10*3/uL (ref 150–400)
RBC: 3.55 MIL/uL — ABNORMAL LOW (ref 3.87–5.11)
RDW: 13 % (ref 11.5–15.5)
WBC: 5.9 10*3/uL (ref 4.0–10.5)
nRBC: 0 % (ref 0.0–0.2)

## 2023-04-20 LAB — RPR: RPR Ser Ql: NONREACTIVE

## 2023-04-20 LAB — TYPE AND SCREEN
ABO/RH(D): B POS
Antibody Screen: NEGATIVE

## 2023-04-20 SURGERY — Surgical Case
Anesthesia: Epidural

## 2023-04-20 MED ORDER — ONDANSETRON HCL 4 MG/2ML IJ SOLN
INTRAMUSCULAR | Status: DC | PRN
  Administered 2023-04-20: 4 mg via INTRAVENOUS

## 2023-04-20 MED ORDER — FENTANYL-BUPIVACAINE-NACL 0.5-0.125-0.9 MG/250ML-% EP SOLN
12.0000 mL/h | EPIDURAL | Status: DC | PRN
  Administered 2023-04-20: 12 mL/h via EPIDURAL

## 2023-04-20 MED ORDER — TERBUTALINE SULFATE 1 MG/ML IJ SOLN: 0.2500 mg | Freq: Once | INTRAMUSCULAR | Status: DC | PRN

## 2023-04-20 MED ORDER — SODIUM CHLORIDE 0.9 % AMNIOINFUSION: INTRAVENOUS | Status: DC

## 2023-04-20 MED ORDER — FENTANYL CITRATE (PF) 100 MCG/2ML IJ SOLN: 100.0000 ug | INTRAMUSCULAR | Status: DC | PRN

## 2023-04-20 MED ORDER — FENTANYL CITRATE (PF) 100 MCG/2ML IJ SOLN
INTRAMUSCULAR | Status: DC | PRN
  Administered 2023-04-20: 100 ug via EPIDURAL

## 2023-04-20 MED ORDER — MORPHINE SULFATE (PF) 0.5 MG/ML IJ SOLN
INTRAMUSCULAR | Status: DC | PRN
  Administered 2023-04-20: 3 mg via EPIDURAL

## 2023-04-20 MED ORDER — DEXAMETHASONE SODIUM PHOSPHATE 10 MG/ML IJ SOLN
INTRAMUSCULAR | Status: DC | PRN
  Administered 2023-04-20: 10 mg via INTRAVENOUS

## 2023-04-20 MED ORDER — TRANEXAMIC ACID-NACL 1000-0.7 MG/100ML-% IV SOLN
INTRAVENOUS | Status: AC
  Filled 2023-04-20: qty 100

## 2023-04-20 MED ORDER — LACTATED RINGERS AMNIOINFUSION
INTRAVENOUS | Status: DC
Start: 2023-04-20 — End: 2023-04-21
  Filled 2023-04-20 (×2): qty 1000

## 2023-04-20 MED ORDER — LIDOCAINE HCL (PF) 1 % IJ SOLN: 30.0000 mL | INTRAMUSCULAR | Status: DC | PRN

## 2023-04-20 MED ORDER — PHENYLEPHRINE 80 MCG/ML (10ML) SYRINGE FOR IV PUSH (FOR BLOOD PRESSURE SUPPORT)
80.0000 ug | PREFILLED_SYRINGE | Freq: Once | INTRAVENOUS | Status: AC | PRN
  Administered 2023-04-20: 160 ug via INTRAVENOUS

## 2023-04-20 MED ORDER — DEXAMETHASONE SODIUM PHOSPHATE 10 MG/ML IJ SOLN
INTRAMUSCULAR | Status: AC
  Filled 2023-04-20: qty 2

## 2023-04-20 MED ORDER — LIDOCAINE-EPINEPHRINE (PF) 2 %-1:200000 IJ SOLN
INTRAMUSCULAR | Status: DC | PRN
  Administered 2023-04-20: 4 mL via EPIDURAL
  Administered 2023-04-20 (×2): 2 mL via EPIDURAL
  Administered 2023-04-20: 4 mL via EPIDURAL

## 2023-04-20 MED ORDER — EPHEDRINE 5 MG/ML INJ: 10.0000 mg | INTRAVENOUS | Status: DC | PRN

## 2023-04-20 MED ORDER — LACTATED RINGERS IV SOLN: 500.0000 mL | Freq: Once | INTRAVENOUS | Status: DC

## 2023-04-20 MED ORDER — OXYTOCIN-SODIUM CHLORIDE 30-0.9 UT/500ML-% IV SOLN: 2.5000 [IU]/h | INTRAVENOUS | Status: DC

## 2023-04-20 MED ORDER — LACTATED RINGERS IV SOLN
500.0000 mL | INTRAVENOUS | Status: DC | PRN
  Administered 2023-04-20: 300 mL via INTRAVENOUS

## 2023-04-20 MED ORDER — ACETAMINOPHEN 325 MG PO TABS: 650.0000 mg | ORAL_TABLET | ORAL | Status: DC | PRN

## 2023-04-20 MED ORDER — LACTATED RINGERS IV SOLN: INTRAVENOUS | Status: DC | PRN

## 2023-04-20 MED ORDER — ACETAMINOPHEN 10 MG/ML IV SOLN
INTRAVENOUS | Status: DC | PRN
  Administered 2023-04-20: 1000 mg via INTRAVENOUS

## 2023-04-20 MED ORDER — OXYCODONE-ACETAMINOPHEN 5-325 MG PO TABS: 1.0000 | ORAL_TABLET | ORAL | Status: DC | PRN

## 2023-04-20 MED ORDER — SOD CITRATE-CITRIC ACID 500-334 MG/5ML PO SOLN: 30.0000 mL | ORAL | Status: DC | PRN

## 2023-04-20 MED ORDER — DIPHENHYDRAMINE HCL 50 MG/ML IJ SOLN: 12.5000 mg | INTRAMUSCULAR | Status: DC | PRN

## 2023-04-20 MED ORDER — LACTATED RINGERS IV SOLN: INTRAVENOUS | Status: DC

## 2023-04-20 MED ORDER — ONDANSETRON HCL 4 MG/2ML IJ SOLN
INTRAMUSCULAR | Status: AC
  Filled 2023-04-20: qty 2

## 2023-04-20 MED ORDER — CEFAZOLIN SODIUM-DEXTROSE 2-3 GM-%(50ML) IV SOLR
INTRAVENOUS | Status: DC | PRN
  Administered 2023-04-20: 2 g via INTRAVENOUS

## 2023-04-20 MED ORDER — PHENYLEPHRINE 80 MCG/ML (10ML) SYRINGE FOR IV PUSH (FOR BLOOD PRESSURE SUPPORT)
80.0000 ug | PREFILLED_SYRINGE | INTRAVENOUS | Status: AC | PRN
  Administered 2023-04-20: 400 ug via INTRAVENOUS
  Administered 2023-04-20 (×2): 160 ug via INTRAVENOUS
  Filled 2023-04-20 (×2): qty 10

## 2023-04-20 MED ORDER — FENTANYL-BUPIVACAINE-NACL 0.5-0.125-0.9 MG/250ML-% EP SOLN
EPIDURAL | Status: AC
  Filled 2023-04-20: qty 250

## 2023-04-20 MED ORDER — TRANEXAMIC ACID-NACL 1000-0.7 MG/100ML-% IV SOLN: 1000.0000 mg | Freq: Once | INTRAVENOUS | Status: DC

## 2023-04-20 MED ORDER — MORPHINE SULFATE (PF) 0.5 MG/ML IJ SOLN
INTRAMUSCULAR | Status: AC
  Filled 2023-04-20: qty 10

## 2023-04-20 MED ORDER — CEFAZOLIN SODIUM-DEXTROSE 2-4 GM/100ML-% IV SOLN: 2.0000 g | INTRAVENOUS | Status: DC

## 2023-04-20 MED ORDER — FENTANYL CITRATE (PF) 100 MCG/2ML IJ SOLN
INTRAMUSCULAR | Status: AC
  Filled 2023-04-20: qty 2

## 2023-04-20 MED ORDER — PHENYLEPHRINE 80 MCG/ML (10ML) SYRINGE FOR IV PUSH (FOR BLOOD PRESSURE SUPPORT)
PREFILLED_SYRINGE | INTRAVENOUS | Status: DC | PRN
  Administered 2023-04-20 (×3): 80 ug via INTRAVENOUS

## 2023-04-20 MED ORDER — PHENYLEPHRINE 80 MCG/ML (10ML) SYRINGE FOR IV PUSH (FOR BLOOD PRESSURE SUPPORT)
PREFILLED_SYRINGE | INTRAVENOUS | Status: AC
  Filled 2023-04-20: qty 10

## 2023-04-20 MED ORDER — OXYCODONE-ACETAMINOPHEN 5-325 MG PO TABS: 2.0000 | ORAL_TABLET | ORAL | Status: DC | PRN

## 2023-04-20 MED ORDER — PHENYLEPHRINE HCL-NACL 20-0.9 MG/250ML-% IV SOLN
INTRAVENOUS | Status: DC | PRN
  Administered 2023-04-20: 15 ug/min via INTRAVENOUS

## 2023-04-20 MED ORDER — OXYTOCIN-SODIUM CHLORIDE 30-0.9 UT/500ML-% IV SOLN
1.0000 m[IU]/min | INTRAVENOUS | Status: DC
  Administered 2023-04-20: 2 m[IU]/min via INTRAVENOUS
  Administered 2023-04-20: 1 m[IU]/min via INTRAVENOUS
  Filled 2023-04-20: qty 500

## 2023-04-20 MED ORDER — SODIUM CHLORIDE 0.9 % IV SOLN
INTRAVENOUS | Status: DC | PRN
  Administered 2023-04-20: 500 mg via INTRAVENOUS

## 2023-04-20 MED ORDER — OXYTOCIN BOLUS FROM INFUSION: 333.0000 mL | Freq: Once | INTRAVENOUS | Status: DC

## 2023-04-20 MED ORDER — ONDANSETRON HCL 4 MG/2ML IJ SOLN: 4.0000 mg | Freq: Four times a day (QID) | INTRAMUSCULAR | Status: DC | PRN

## 2023-04-20 MED ORDER — TRANEXAMIC ACID-NACL 1000-0.7 MG/100ML-% IV SOLN
INTRAVENOUS | Status: DC | PRN
  Administered 2023-04-20: 1000 mg via INTRAVENOUS

## 2023-04-20 MED ORDER — SODIUM CHLORIDE 0.9 % IV SOLN: 500.0000 mg | INTRAVENOUS | Status: DC

## 2023-04-20 MED ORDER — SOD CITRATE-CITRIC ACID 500-334 MG/5ML PO SOLN
30.0000 mL | ORAL | Status: AC
  Administered 2023-04-20: 30 mL via ORAL
  Filled 2023-04-20: qty 30

## 2023-04-20 MED ORDER — PHENYLEPHRINE 80 MCG/ML (10ML) SYRINGE FOR IV PUSH (FOR BLOOD PRESSURE SUPPORT)
80.0000 ug | PREFILLED_SYRINGE | INTRAVENOUS | Status: DC | PRN
  Administered 2023-04-20: 160 ug via INTRAVENOUS

## 2023-04-20 MED ORDER — SODIUM CHLORIDE 0.9 % IR SOLN
Status: DC | PRN
  Administered 2023-04-20: 1

## 2023-04-20 MED ORDER — PHENYLEPHRINE 80 MCG/ML (10ML) SYRINGE FOR IV PUSH (FOR BLOOD PRESSURE SUPPORT)
80.0000 ug | PREFILLED_SYRINGE | INTRAVENOUS | Status: AC | PRN
  Administered 2023-04-20: 160 ug via INTRAVENOUS
  Administered 2023-04-20: 240 ug via INTRAVENOUS
  Administered 2023-04-20: 160 ug via INTRAVENOUS

## 2023-04-20 SURGICAL SUPPLY — 32 items
BENZOIN TINCTURE PRP APPL 2/3 (GAUZE/BANDAGES/DRESSINGS) ×1 IMPLANT
CANISTER SUCT 3000ML PPV (MISCELLANEOUS) ×1 IMPLANT
CHLORAPREP W/TINT 26 (MISCELLANEOUS) ×2 IMPLANT
CLAMP UMBILICAL CORD (MISCELLANEOUS) ×1 IMPLANT
DRSG OPSITE POSTOP 4X10 (GAUZE/BANDAGES/DRESSINGS) ×1 IMPLANT
ELECT REM PT RETURN 9FT ADLT (ELECTROSURGICAL) ×1
ELECTRODE REM PT RTRN 9FT ADLT (ELECTROSURGICAL) ×1 IMPLANT
EXTRACTOR VACUUM KIWI (MISCELLANEOUS) ×1 IMPLANT
GLOVE BIOGEL PI IND STRL 7.0 (GLOVE) ×2 IMPLANT
GLOVE BIOGEL PI IND STRL 7.5 (GLOVE) ×1 IMPLANT
GLOVE SURG SS PI 7.0 STRL IVOR (GLOVE) ×1 IMPLANT
GOWN STRL REUS W/ TWL LRG LVL3 (GOWN DISPOSABLE) ×2 IMPLANT
GOWN STRL REUS W/ TWL XL LVL3 (GOWN DISPOSABLE) ×1 IMPLANT
LIGASURE IMPACT 36 18CM CVD LR (INSTRUMENTS) IMPLANT
MAT PREVALON FULL STRYKER (MISCELLANEOUS) IMPLANT
NS IRRIG 1000ML POUR BTL (IV SOLUTION) ×1 IMPLANT
PACK C SECTION WH (CUSTOM PROCEDURE TRAY) ×1 IMPLANT
PAD ABD 7.5X8 STRL (GAUZE/BANDAGES/DRESSINGS) ×1 IMPLANT
PAD OB MATERNITY 4.3X12.25 (PERSONAL CARE ITEMS) ×1 IMPLANT
PAD PREP 24X48 CUFFED NSTRL (MISCELLANEOUS) ×1 IMPLANT
RETRACTOR WND ALEXIS 25 LRG (MISCELLANEOUS) ×1 IMPLANT
RTRCTR WOUND ALEXIS 25CM LRG (MISCELLANEOUS) ×1
STRIP CLOSURE SKIN 1/2X4 (GAUZE/BANDAGES/DRESSINGS) ×1 IMPLANT
SUT MNCRL 0 VIOLET CTX 36 (SUTURE) ×2 IMPLANT
SUT MON AB 4-0 PS1 27 (SUTURE) ×1 IMPLANT
SUT PLAIN 2 0 XLH (SUTURE) ×1 IMPLANT
SUT VIC AB 0 CT1 36 (SUTURE) ×2 IMPLANT
SUT VIC AB 2-0 CT1 TAPERPNT 27 (SUTURE) IMPLANT
SUT VIC AB 3-0 CT1 TAPERPNT 27 (SUTURE) ×1 IMPLANT
SUTURE PLAIN GUT 2.0 ETHICON (SUTURE) IMPLANT
TOWEL OR 17X24 6PK STRL BLUE (TOWEL DISPOSABLE) ×2 IMPLANT
WATER STERILE IRR 1000ML POUR (IV SOLUTION) ×1 IMPLANT

## 2023-04-20 NOTE — Anesthesia Preprocedure Evaluation (Addendum)
Anesthesia Evaluation  Patient identified by MRN, date of birth, ID band Patient awake    Reviewed: Allergy & Precautions, NPO status , Patient's Chart, lab work & pertinent test results, reviewed documented beta blocker date and time   Airway Mallampati: III  TM Distance: >3 FB Neck ROM: Full    Dental no notable dental hx.    Pulmonary former smoker   Pulmonary exam normal breath sounds clear to auscultation       Cardiovascular + Past MI  Normal cardiovascular exam+ dysrhythmias (VF arrest in 2011, diagnosed with HOCM) Ventricular Fibrillation + Cardiac Defibrillator  Rhythm:Regular Rate:Normal  HOCM   Neuro/Psych  PSYCHIATRIC DISORDERS  Depression    negative neurological ROS     GI/Hepatic negative GI ROS, Neg liver ROS,,,  Endo/Other  negative endocrine ROS  Obese BMI 38  Renal/GU negative Renal ROS  negative genitourinary   Musculoskeletal negative musculoskeletal ROS (+)    Abdominal   Peds  Hematology  (+) Blood dyscrasia, anemia Lab Results      Component                Value               Date                      WBC                      5.9                 04/20/2023                HGB                      9.9 (L)             04/20/2023                HCT                      30.5 (L)            04/20/2023                MCV                      85.9                04/20/2023                PLT                      203                 04/20/2023              Anesthesia Other Findings Her medical history includes of hypertrophic cardiomyopathy diagnosed as a teenager is now status post Medtronic ICD due to ventricular arrhythmia for secondary prevention with initial ICD placement in 2011 and later change in 2020 she has rerelease follow-up with Dr. Graciela Husbands, myocardial bridging in the LAD which was done in a left heart catheterization at [facility] in 2014, former smoker   Reproductive/Obstetrics (+) Pregnancy                             Anesthesia Physical Anesthesia Plan  ASA: 3  Anesthesia Plan: Epidural   Post-op Pain Management:    Induction:   PONV Risk Score and Plan: 2 and Treatment may vary due to age or medical condition  Airway Management Planned: Natural Airway  Additional Equipment: Arterial line  Intra-op Plan:   Post-operative Plan:   Informed Consent: I have reviewed the patients History and Physical, chart, labs and discussed the procedure including the risks, benefits and alternatives for the proposed anesthesia with the patient or authorized representative who has indicated his/her understanding and acceptance.       Plan Discussed with: Anesthesiologist and CRNA  Anesthesia Plan Comments: (Patient identified. Risks, benefits, options discussed with patient including but not limited to bleeding, infection, nerve damage, paralysis, failed block, incomplete pain control, headache, blood pressure changes, nausea, vomiting, reactions to medication, itching, and post partum back pain. Confirmed with bedside nurse the patient's most recent platelet count. Confirmed with the patient that they are not taking any anticoagulation, have any bleeding history or any family history of bleeding disorders. Patient expressed understanding and wishes to proceed. All questions were answered.   Update: C/S called by OB for fetal intolerance. Epidural functioning well, to be used for C/S. Discussed with patient.)       Anesthesia Quick Evaluation

## 2023-04-20 NOTE — Anesthesia Procedure Notes (Signed)
Arterial Line Insertion Start/End12/18/2024 12:20 PM, 04/20/2023 12:24 PM Performed by: Elmer Picker, MD, anesthesiologist  Patient location: OR. Preanesthetic checklist: patient identified, IV checked, risks and benefits discussed, surgical consent, monitors and equipment checked, pre-op evaluation and timeout performed Right, radial was placed Catheter size: 20 G Hand hygiene performed  Allen's test indicative of satisfactory collateral circulation Attempts: 1 Procedure performed using ultrasound guided technique. Ultrasound Notes:anatomy identified, needle tip was noted to be adjacent to the nerve/plexus identified and no ultrasound evidence of intravascular and/or intraneural injection Following insertion, dressing applied and Biopatch. Post procedure assessment: normal and unchanged  Patient tolerated the procedure well with no immediate complications.

## 2023-04-20 NOTE — Consult Note (Signed)
Cardiology Consultation   Patient ID: Sara Lopez MRN: [id number]; DOB: 09-17-1995  Admit date: 04/20/2023 Date of Consult: 04/20/2023  PCP:  Loletta Specter, PA-C   [facility] Providers Cardiologist:  Thomasene Ripple, DO  Electrophysiologist:  Sherryl Manges, MD       Patient Profile:   Sara Lopez is a 27 y.o. female with a hx of HOCM, VT , pregnancy  who is being seen 04/20/2023 for the evaluation of induction of labor  at the request of  Dr. Crissie Reese  .  History of Present Illness:   Ms. Heart had a hx of VT , cardiac arrest  Was eventually diagnosed with HOCM  She has been under the care of Dr. Servando Salina for her high risk pregnancy  She has been on metoprolol until 2 days ago  Pregnancy has gone well   She has had her membranes ruptured several minutes ago .   Seems to be doing well      Past Medical History:  Diagnosis Date   ADHD    ADHD (attention deficit hyperactivity disorder) 07/21/2011   Cardiac pacemaker in situ 03/27/2018   Coronary-myocardial bridge    LHC at [facility] 2/14: LAD mid myocardial bridge (patent in diastole and near complete occlusion distally) // Myoview 2/14: Inferior attenuation, no ischemia, normal EF   Depression    Development delay    Developmental delay 07/21/2011   History of cardiac arrest 09/21/2016   History of echocardiogram 08/2016   Echo 5/18: severe asymmetric septal hypertrophy, EF 60-65, dynamic obstruction at rest, peak LVOT 328 cm/sec and peak gradient 43 mmHg, no RWMA, septal thickness 24.6 mm, post wall thickness 15.5 mm, +SAM, mild MR, severe LAE, PASP 30   History of echocardiogram 1.2014 at [facility]   Echo 1/14: Hypertrophic CM, mild dynamic LVOT - peak 27 mmHg, chordal SAM with LVOT obstruction, mild MR, dilate LCA, abnormal LV diastolic function, hyperdynamic LV systolic function    History of MI (myocardial infarction) 09/16/2016   HOCM (hypertrophic obstructive cardiomyopathy) (HCC)    ICD (implantable  cardioverter-defibrillator) in place    Medtronic model D334DRM (Protect), serial number [id number], implanted 04/30/2010    Myocardial infarction (HCC)    hx of elevated Tn levels // LHC in 2014 at [facility] with myocardial bridging   Postpartum visit 06/09/2021   Red Chart Rounds Patient 12/19/2020   Red Chart Rounds Patient Care Plan     Sara Lopez  G1P0  Current GA: [redacted]w[redacted]d          OB History  Gravida  Para  Term  Preterm  AB  Living  1  0  0  0  0  0  SAB  IAB  Ectopic  Multiple  Live Births  0  0  0  0  0        Patient encounter date not specified.        Red Chart Rounds Diagnosis: HOCM s/p remote cardiac arrest with ICD in place, coronary-myocardial bridge     Consultants: Cards,    Single implantable cardioverter-defibrillator (ICD) in situ 07/21/2011   VF (ventricular fibrillation) (HCC)    admitted in 2011 with VF arrest >> dx with HOCM // s/p ICD    Past Surgical History:  Procedure Laterality Date   ICD GENERATOR CHANGEOUT N/A 05/10/2018   Procedure: ICD GENERATOR CHANGEOUT;  Surgeon: Duke Salvia, MD;  Location: [facility];  Service: Cardiovascular;  Laterality: N/A;   ICD IMPLANT  2011     Home Medications:  Prior to Admission medications   Medication Sig Start Date End Date Taking? Authorizing Provider  cyclobenzaprine (FLEXERIL) 10 MG tablet Take 1 tablet (10 mg total) by mouth 2 (two) times daily as needed for muscle spasms. 04/19/23  Yes Federico Flake, MD  metoprolol succinate (TOPROL XL) 25 MG 24 hr tablet Take 0.5 tablets (12.5 mg total) by mouth daily. 01/07/23  Yes Tobb, Kardie, DO  pantoprazole (PROTONIX) 20 MG tablet Take 1 tablet (20 mg total) by mouth daily. 02/03/23  Yes Adam Phenix, MD  Prenatal Vit-Fe Fumarate-FA (MULTIVITAMIN-PRENATAL) 27-0.8 MG TABS tablet Take 1 tablet by mouth daily at 12 noon.   Yes [provider]    Inpatient Medications: Scheduled Meds:  fentaNYL 2 mcg/mL w/bupivacaine 0.125% in NS 250 mL        oxytocin 40 units in LR 1000 mL  333 mL Intravenous Once   Continuous Infusions:  fentaNYL 2 mcg/mL w/bupivacaine 0.125% in NS 250 mL     lactated ringers     lactated ringers     lactated ringers 125 mL/hr at 04/20/23 0843   oxytocin     oxytocin 6 milli-units/min (04/20/23 1129)   PRN Meds: acetaminophen, diphenhydrAMINE, fentaNYL (SUBLIMAZE) injection, fentaNYL 2 mcg/mL w/bupivacaine 0.125% in NS 250 mL, fentaNYL 2 mcg/mL w/bupivacaine 0.125% in NS 250 mL, lactated ringers, lidocaine (PF), ondansetron, oxyCODONE-acetaminophen, oxyCODONE-acetaminophen, phenylephrine, phenylephrine, sodium citrate-citric acid, terbutaline  Allergies:   No Known Allergies  Social History:   Social History   Socioeconomic History   Marital status: Single    Spouse name: Not on file   Number of children: Not on file   Years of education: Not on file   Highest education level: Not on file  Occupational History   Occupation: Conservation officer, nature  Tobacco Use   Smoking status: Former    Current packs/day: 0.00    Types: Cigarettes    Quit date: 07/01/2020    Years since quitting: 2.8   Smokeless tobacco: Former  Building services engineer status: Former   Substances: Nicotine   Devices: not since preg 06/2020  Substance and Sexual Activity   Alcohol use: Not Currently    Alcohol/week: 1.0 standard drink of alcohol    Types: 1 Glasses of wine per week    Comment: not since preg   Drug use: No    Types: Marijuana    Comment: no drugs since 2011   Sexual activity: Not Currently    Birth control/protection: Pill  Other Topics Concern   Not on file  Social History Narrative   Stay at home mom   Social Drivers of Health   Financial Resource Strain: Not on file  Food Insecurity: No Food Insecurity (04/20/2023)   Hunger Vital Sign    Worried About Running Out of Food in the Last Year: Never true    Ran Out of Food in the Last Year: Never true  Transportation Needs: No Transportation Needs (04/20/2023)    PRAPARE - Administrator, Civil Service (Medical): No    Lack of Transportation (Non-Medical): No  Physical Activity: Not on file  Stress: Not on file  Social Connections: Not on file  Intimate Partner Violence: Unknown (04/20/2023)   Humiliation, Afraid, Rape, and Kick questionnaire    Fear of Current or Ex-Partner: No    Emotionally Abused: No    Physically Abused: Not on file    Sexually Abused: No    Family  History:    Family History  Adopted: Yes   She has a daughter ( age 51) that apparently has HOCM as well, according to her husband.  ROS:  Please see the history of present illness.   All other ROS reviewed and negative.     Physical Exam/Data:   Vitals:   04/20/23 1151 04/20/23 1152 04/20/23 1153 04/20/23 1154  BP:      Pulse: 90 (!) 102 90 76  Resp:      Temp:      TempSrc:      SpO2: 99% 100% 100% 99%  Weight:      Height:       No intake or output data in the 24 hours ending 04/20/23 1223    04/20/2023    6:35 AM 04/19/2023    3:49 PM 04/13/2023    3:27 PM  Last 3 Weights  Weight (lbs) 230 lb 1.6 oz 227 lb 8 oz 226 lb  Weight (kg) 104.373 kg 103.193 kg 102.513 kg     Body mass index is 38.29 kg/m.  General:  Well nourished, well developed, in no acute distress,   pregnant HEENT: normal Neck: no JVD Vascular: No carotid bruits; Distal pulses 2+ bilaterally Cardiac:  normal S1, S2;  2/6 systolic murmur  Lungs:  clear to auscultation bilaterally, no wheezing, rhonchi or rales  Abd: soft, nontender, no hepatomegaly  Ext: no edema Musculoskeletal:  No deformities, BUE and BLE strength normal and equal Skin: warm and dry  Neuro:  CNs 2-12 intact, no focal abnormalities noted Psych:  Normal affect   EKG:  The EKG was personally reviewed and demonstrates:   NSR , TWI inferiorly   Telemetry:  Telemetry was personally reviewed and demonstrates:   nsr   Relevant CV Studies: =  Laboratory Data:  High Sensitivity Troponin:  No results  for input(s): "TROPONINIHS" in the last 720 hours.   ChemistryNo results for input(s): "NA", "K", "CL", "CO2", "GLUCOSE", "BUN", "CREATININE", "CALCIUM", "MG", "GFRNONAA", "GFRAA", "ANIONGAP" in the last 168 hours.  No results for input(s): "PROT", "ALBUMIN", "AST", "ALT", "ALKPHOS", "BILITOT" in the last 168 hours. Lipids No results for input(s): "CHOL", "TRIG", "HDL", "LABVLDL", "LDLCALC", "CHOLHDL" in the last 168 hours.  Hematology Recent Labs  Lab 04/20/23 0617  WBC 5.9  RBC 3.55*  HGB 9.9*  HCT 30.5*  MCV 85.9  MCH 27.9  MCHC 32.5  RDW 13.0  PLT 203   Thyroid No results for input(s): "TSH", "FREET4" in the last 168 hours.  BNPNo results for input(s): "BNP", "PROBNP" in the last 168 hours.  DDimer No results for input(s): "DDIMER" in the last 168 hours.   Radiology/Studies:  No results found.   Assessment and Plan:   Pregnancy in the setting of HOCM:   she has had her membranes ruptures.   Had some hypotension , treated with neosynephirine  Seems to be doing well   HR is well controlled.    Would advise continuing to keep her euvolemic ( avoid volume depletion ) ,  would use low dose beta blockers if needed for tachycardia  Neosynephrine will be ok for hypotension if needed    At this point , mom and baby are doing well   Risk Assessment/Risk Scores:                For questions or updates, please contact [facility] Please consult [email or web address] for contact info under    Signed, Kristeen Miss, MD  04/20/2023 12:23 PM

## 2023-04-20 NOTE — Anesthesia Procedure Notes (Signed)
Arterial Line Insertion Start/End12/18/2024 8:26 PM, 04/20/2023 8:56 PM Performed by: Beryle Lathe, MD, anesthesiologist  Patient location: Nursing unit. Preanesthetic checklist: patient identified, IV checked, risks and benefits discussed, surgical consent, monitors and equipment checked, pre-op evaluation, timeout performed and anesthesia consent Lidocaine 1% used for infiltration and patient sedated Left, radial was placed Catheter size: 20 G Hand hygiene performed   Attempts: 2 (Attempt on right radial artery unsuccessful. First attempt on left radial artery successful) Procedure performed using ultrasound guided technique. Ultrasound Notes:anatomy identified, needle tip was noted to be adjacent to the nerve/plexus identified and no ultrasound evidence of intravascular and/or intraneural injection Following insertion, dressing applied and Biopatch. Post procedure assessment: unchanged and normal  Patient tolerated the procedure well with no immediate complications. Additional procedure comments: No [place] image captured.

## 2023-04-20 NOTE — Progress Notes (Addendum)
L&D Note  04/20/2023 - 9:35 PM  27 y.o. G2P1001 [redacted]w[redacted]d. Pregnancy complicated by:  Patient Active Problem List   Diagnosis Date Noted   History of ventricular fibrillation 04/20/2023   ICD (implantable cardioverter-defibrillator) in place - MDT 01/05/2023   Rubella non-immune status, antepartum 11/18/2022   Unwanted fertility 11/16/2022   Supervision of high risk pregnancy, antepartum 11/09/2022   Red Chart Rounds Patient 12/19/2020   Cardiac pacemaker in situ 03/27/2018   Heart disease 03/27/2018   HOCM (hypertrophic obstructive cardiomyopathy) (HCC) 09/21/2016   Coronary-myocardial bridge    VF (ventricular fibrillation) (HCC) 07/21/2011   [name] is admitted for IOL for cardiac issues   Subjective:  Comfortable with epidural in place and still not feeling any contractions. No cardiac or respiratory s/s No A-line in place  Objective:    Current Vital Signs 24h Vital Sign Ranges  T 98.2 F (36.8 C) Temp  Avg: 98.6 F (37 C)  Min: 98.2 F (36.8 C)  Max: 99.1 F (37.3 C)  BP 107/69 BP  Min: 87/48  Max: 140/78  HR 86 Pulse  Avg: 81.5  Min: 71  Max: 115  RR 16 Resp  Avg: 16  Min: 16  Max: 16  SaO2 100 % Room Air SpO2  Avg: 98.3 %  Min: 94 %  Max: 100 %       24 Hour I/O Current Shift I/O  Time Ins Outs No intake/output data recorded. No intake/output data recorded.   Patient Vitals for the past 6 hrs:  BP Temp Temp src Pulse SpO2  04/20/23 2125 -- -- -- 86 100 %  04/20/23 2120 -- -- -- 77 100 %  04/20/23 2115 -- -- -- 79 100 %  04/20/23 2106 -- 98.2 F (36.8 C) Axillary 81 100 %  04/20/23 2100 -- -- -- 79 100 %  04/20/23 2055 -- -- -- 90 99 %  04/20/23 2050 -- -- -- 79 100 %  04/20/23 2045 107/69 -- -- 78 100 %  04/20/23 2030 99/67 -- -- 84 100 %  04/20/23 2023 -- -- -- 81 100 %  04/20/23 2016 118/71 -- -- 81 100 %  04/20/23 2007 -- -- -- 76 100 %  04/20/23 2005 114/70 -- -- 78 100 %  04/20/23 2001 99/73 -- -- 82 100 %  04/20/23 2000 99/73 -- -- 76  100 %  04/20/23 1950 -- -- -- 82 100 %  04/20/23 1945 106/71 -- -- 75 100 %  04/20/23 1942 -- -- -- 89 100 %  04/20/23 1938 109/73 -- -- 82 100 %  04/20/23 1930 -- -- -- 79 100 %  04/20/23 1926 -- -- -- 80 100 %  04/20/23 1915 -- -- -- 80 99 %  04/20/23 1900 111/67 -- -- 81 100 %  04/20/23 1855 -- -- -- 79 100 %  04/20/23 1852 -- -- -- 77 100 %  04/20/23 1850 -- -- -- 76 99 %  04/20/23 1845 -- -- -- 78 100 %  04/20/23 1840 -- -- -- 80 100 %  04/20/23 1835 -- -- -- 81 99 %  04/20/23 1830 -- -- -- 78 100 %  04/20/23 1825 -- -- -- 85 99 %  04/20/23 1820 -- -- -- 87 100 %  04/20/23 1815 -- -- -- 84 100 %  04/20/23 1810 -- -- -- 87 100 %  04/20/23 1805 -- -- -- 86 99 %  04/20/23 1800 112/70 -- -- 87 100 %  04/20/23  1755 -- -- -- 95 100 %  04/20/23 1750 -- -- -- 81 99 %  04/20/23 1745 -- -- -- 86 98 %  04/20/23 1740 -- -- -- 77 97 %  04/20/23 1736 111/63 -- -- -- --  04/20/23 1735 -- -- -- 92 98 %  04/20/23 1730 -- 99 F (37.2 C) -- 87 97 %  04/20/23 1721 -- -- -- 82 97 %  04/20/23 1720 -- -- -- 87 97 %  04/20/23 1715 -- -- -- 91 98 %  04/20/23 1710 -- -- -- 89 97 %  04/20/23 1705 -- -- -- 84 98 %  04/20/23 1700 (!) 140/78 -- -- 87 96 %  04/20/23 1655 -- -- -- 80 97 %  04/20/23 1650 -- -- -- 85 98 %  04/20/23 1645 -- -- -- 80 97 %  04/20/23 1640 -- -- -- 81 96 %  04/20/23 1635 114/62 -- -- 85 97 %  04/20/23 1630 -- -- -- 97 96 %  04/20/23 1627 -- 99.1 F (37.3 C) -- 91 95 %  04/20/23 1625 -- -- -- 88 97 %  04/20/23 1620 -- -- -- 86 96 %  04/20/23 1615 -- -- -- 85 96 %  04/20/23 1610 -- -- -- 87 96 %  04/20/23 1605 -- -- -- 87 97 %  04/20/23 1600 124/73 -- -- 89 97 %  04/20/23 1556 -- -- -- 91 96 %  04/20/23 1555 -- -- -- 86 98 %  04/20/23 1551 124/85 -- -- -- --  04/20/23 1550 -- -- -- 73 95 %  04/20/23 1545 -- -- -- 82 96 %  04/20/23 1544 -- -- -- 75 95 %  04/20/23 1540 -- -- -- 80 95 %   FHR: 170 baseline but may be higher with the variables, no accels, min-mod  variability Toco: q37m Gen: NAD SVE: unchanged. 9 (lip and at 10 o'clock)/80/BPD 0 with leading edge to +1 +vulva edema now and baby continues to look OP  Labs:  Recent Labs  Lab 04/20/23 0617  WBC 5.9  HGB 9.9*  HCT 30.5*  PLT 203  B POS    Latest Ref Rng & Units 05/25/2021    9:47 AM 05/08/2021    4:34 AM 05/07/2021    2:58 PM  CMP  Glucose 70 - 99 mg/dL 88  75  81   BUN 6 - 20 mg/dL 7  14  17    Creatinine 0.57 - 1.00 mg/dL 0.10  2.72  5.36   Sodium 134 - 144 mmol/L 139  132  133   Potassium 3.5 - 5.2 mmol/L 3.6  3.6  3.7   Chloride 96 - 106 mmol/L 105  108  106   CO2 20 - 29 mmol/L 22  18  20    Calcium 8.7 - 10.2 mg/dL 8.4  7.7  8.3    Medications Current Facility-Administered Medications  Medication Dose Route Frequency Provider Last Rate Last Admin   acetaminophen (TYLENOL) tablet 650 mg  650 mg Oral Q4H PRN Arabella Merles, CNM       diphenhydrAMINE (BENADRYL) injection 12.5 mg  12.5 mg Intravenous Q15 min PRN Stoltzfus, Nelle Don, DO       fentaNYL (SUBLIMAZE) injection 100 mcg  100 mcg Intravenous Q1H PRN Cam Hai D, CNM       fentaNYL 2 mcg/mL w/ bupivacaine 0.125% in NS 250 mL epidural infusion  12 mL/hr Epidural Continuous PRN Stoltzfus, Nelle Don, DO 12 mL/hr at 04/20/23  1610 12 mL/hr at 04/20/23 9604   lactated ringers amnioinfusion   Intrauterine Continuous Republic Bing, MD 125 mL/hr at 04/20/23 2102 New Bag at 04/20/23 2102   lactated ringers infusion 500 mL  500 mL Intravenous Once Stoltzfus, Gregory P, DO       lactated ringers infusion 500-1,000 mL  500-1,000 mL Intravenous PRN Arabella Merles, CNM 999 mL/hr at 04/20/23 1545 300 mL at 04/20/23 1545   lactated ringers infusion   Intravenous Continuous Arabella Merles, CNM 125 mL/hr at 04/20/23 1544 New Bag at 04/20/23 1544   lidocaine (PF) (XYLOCAINE) 1 % injection 30 mL  30 mL Subcutaneous PRN Arabella Merles, CNM       ondansetron [facility]) injection 4 mg  4 mg Intravenous Q6H PRN Arabella Merles,  CNM       oxyCODONE-acetaminophen (PERCOCET/ROXICET) 5-325 MG per tablet 1 tablet  1 tablet Oral Q4H PRN Arabella Merles, CNM       oxyCODONE-acetaminophen (PERCOCET/ROXICET) 5-325 MG per tablet 2 tablet  2 tablet Oral Q4H PRN Arabella Merles, CNM       oxytocin (PITOCIN) IV BOLUS FROM BAG  333 mL Intravenous Once Arabella Merles, CNM       oxytocin (PITOCIN) IV infusion 30 units in NS 500 mL - Premix  2.5 Units/hr Intravenous Continuous Cam Hai D, CNM       oxytocin (PITOCIN) IV infusion 30 units in NS 500 mL - Premix  1-40 milli-units/min Intravenous Titrated Arabella Merles, CNM   Stopped at 04/20/23 1546   PHENYLephrine 80 mcg/ml in normal saline Adult IV Push Syringe (For Blood Pressure Support)  80 mcg Intravenous PRN Willette Alma L, MD   160 mcg at 04/20/23 2002   sodium citrate-citric acid (ORACIT) solution 30 mL  30 mL Oral Q2H PRN Arabella Merles, CNM       terbutaline (BRETHINE) injection 0.25 mg  0.25 mg Subcutaneous Once PRN Cam Hai D, CNM       tranexamic acid (CYKLOKAPRON) 1000MG /13mL IVPB            Facility-Administered Medications Ordered in Other Encounters  Medication Dose Route Frequency Provider Last Rate Last Admin   lidocaine-EPINEPHrine (XYLOCAINE W/EPI) 2 %-1:200000 (PF) injection   Epidural Anesthesia Intra-op Elmer Picker, MD   2 mL at 04/20/23 0948   Assessment & Plan:  Patient doing well *Pregnancy: new category II tracing *Labor: I told mom that I'm not optimistic on vaginal delivery chances given lack of cervical change and inability to augment with pitocin given category II tracing. I also told her that her baby looks bigger than her last one (leopolds 4100g) and mom states she feels that this baby is bigger, along with LGA growth u/s at 96% (3248gm) on 11/22). Given fetal intolerance of labor, I told her I recommend proceeding with c/s. Risks of c/s d/w her and her partner and they are amenable with proceeding. I also d/w her re: BTL  and she would still like a BTL.  New t&s drawn b/c had to cut off old band for new A-line; can proceed when OR is ready. Will d/c pitocin and aminoinfusion; iupc still in place *CV: Cardiology (Dr. Servando Salina outpatient and s/p inpatient consult during her induction with Dr. Melburn Popper this afternoon) following closely with CCU RN in room. BPs doing well; over-riding goal is to keep euvolemic and maintain BPs.  -Plan is for patient to go to 63M ICU postpartum  -Phenylephrine okay for hypotension.  -  new arterial line; 2 PIVs in place *GBS: negative *Analgesia: epidural working well.   Cornelia Copa MD Attending Center for [facility])   ADDENDUM Just lost art line. Will hold off on going up on pit until line re-established Cornelia Copa MD Attending Center for [employer] (Faculty Practice) 04/20/2023 Time: [phone]

## 2023-04-20 NOTE — Brief Op Note (Signed)
04/20/2023  11:35 PM  PATIENT:  [facility]  27 y.o. female  PRE-OPERATIVE DIAGNOSIS:  fetal intolerance of labor;desire for permanent sterilization  POST-OPERATIVE DIAGNOSIS:  fetal intolerance of labor;desire for permanent sterilization  PROCEDURE: primary low transverse c-section with bilateral salpingectomies  SURGEON:  Surgeons and Role:    *  Bing, MD - Primary  ASSISTANTS:    Wyn Forster, MD - Fellow   ANESTHESIA:   epidural  EBL:    IVF: crystalloid  BLOOD ADMINISTERED:none  DRAINS: indwelling foley 150 UOP   LOCAL MEDICATIONS USED:  NONE  SPECIMEN:  bilateral fallopian tubes  DISPOSITION OF SPECIMEN:  PATHOLOGY  COUNTS:  YES  TOURNIQUET:  * No tourniquets in log *  DICTATION: .Note written in EPIC  PLAN OF CARE:  admit to MICU  PATIENT DISPOSITION:  PACU - hemodynamically stable.   Delay start of Pharmacological VTE agent (>24hrs) due to surgical blood loss or risk of bleeding: yes  Cornelia Copa MD Attending Center for [facility] (Faculty Practice) GYN Consult Phone: [phone] (M-F, [phone]) & [phone] (Off hours, weekends, holidays)

## 2023-04-20 NOTE — Anesthesia Procedure Notes (Signed)
Epidural Patient location during procedure: OB Start time: 04/20/2023 9:40 AM End time: 04/20/2023 9:50 AM  Staffing Anesthesiologist: Elmer Picker, MD Performed: anesthesiologist   Preanesthetic Checklist Completed: patient identified, IV checked, risks and benefits discussed, monitors and equipment checked, pre-op evaluation and timeout performed  Epidural Patient position: sitting Prep: DuraPrep and site prepped and draped Patient monitoring: continuous pulse ox, blood pressure, heart rate and cardiac monitor Approach: midline Location: L3-L4 Injection technique: LOR air  Needle:  Needle type: Tuohy  Needle gauge: 17 G Needle length: 9 cm Needle insertion depth: 7 cm Catheter type: closed end flexible Catheter size: 19 Gauge Catheter at skin depth: 12 cm Test dose: negative  Assessment Sensory level: T8 Events: blood not aspirated, no cerebrospinal fluid, injection not painful, no injection resistance, no paresthesia and negative IV test  Additional Notes Patient identified. Risks/Benefits/Options discussed with patient including but not limited to bleeding, infection, nerve damage, paralysis, failed block, incomplete pain control, headache, blood pressure changes, nausea, vomiting, reactions to medication both or allergic, itching and postpartum back pain. Confirmed with bedside nurse the patient's most recent platelet count. Confirmed with patient that they are not currently taking any anticoagulation, have any bleeding history or any family history of bleeding disorders. Patient expressed understanding and wished to proceed. All questions were answered. Sterile technique was used throughout the entire procedure. Please see nursing notes for vital signs. Test dose was given through epidural catheter and negative prior to continuing to dose epidural or start infusion. Warning signs of high block given to the patient including shortness of breath, tingling/numbness in  hands, complete motor block, or any concerning symptoms with instructions to call for help. Patient was given instructions on fall risk and not to get out of bed. All questions and concerns addressed with instructions to call with any issues or inadequate analgesia.  Reason for block:procedure for pain

## 2023-04-20 NOTE — Op Note (Incomplete)
Operative Note   SURGERY DATE: 04/20/2023  PRE-OP DIAGNOSIS:  *Pregnancy at 39/0 *Fetal intolerance of labor  POST-OP DIAGNOSIS: Same. Delivered   PROCEDURE: Primary low transverse cesarean section via pfannenstiel skin incision with double layer uterine closure and bilateral salpingectomies  SURGEON: Surgeons and Role:    * Sheldahl Bing, MD - Primary  ASSISTANT:    Wyn Forster, MD - Fellow  An experienced assistant was required given the standard of surgical care given the complexity of the case.  This assistant was needed for exposure, dissection, suctioning, retraction, instrument exchange, assisting with delivery with administration of fundal pressure, and for overall help during the procedure.  ANESTHESIA: epidural  ESTIMATED BLOOD LOSS:   DRAINS: UOP via indwelling foley  TOTAL IV FLUIDS: crystalloid  VTE PROPHYLAXIS: SCDs to bilateral lower extremities  ANTIBIOTICS: Two grams of Cefazolin were given., within 1 hour of skin incision and azithromycin 500mg  IV given intraoperatively  SPECIMENS: bilateral tubes to pathology  COMPLICATIONS: none  FINDINGS: No intra-abdominal adhesions were noted. Grossly normal uterus, tubes and ovaries. Clear amniotic fluid, cephalic, direct OP, female infant, weight 4082gm, APGARs 9/9, intact placenta.  PROCEDURE IN DETAIL: The patient was taken to the operating room where anesthesia was administered and fetal heart tones were confirmed in the 180s. Repeat cervical exam and patient still 9cm/high.   She was then prepped and draped in the normal fashion in the dorsal supine position with a leftward tilt.  After a time out was performed, a pfannensteil skin incision was made with the scalpel and carried through to the underlying layer of fascia. The fascia was then incised at the midline and this incision was extended laterally with the mayo scissors. Attention was turned to the superior aspect of the fascial  incision which was grasped with the kocher clamps x 2, tented up and the rectus muscles were dissected off with the bovie. In a similar fashion the inferior aspect of the fascial incision was grasped with the kocher clamps, tented up and the rectus muscles dissected off with the mayo scissors. The rectus muscles were then separated in the midline and the peritoneum was entered bluntly. The bladder blade was inserted and the vesicouterine peritoneum was identified, tented up and entered with the metzenbaum scissors. This incision was extended laterally and the bladder flap was created digitally. The bladder blade was reinserted.  A low transverse hysterotomy was made with the scalpel until the endometrial cavity was breached and the amniotic sac ruptured, yielding clear amniotic fluid. This incision was extended bluntly and the infant's head, shoulders and body were delivered atraumatically.The cord was clamped x 2 and cut, and the infant was handed to the awaiting pediatricians, after delayed cord clamping was done.  The placenta was then gradually expressed from the uterus and then the uterus was exteriorized and cleared of all clots and debris. The hysterotomy was repaired with a running suture of 1-0 monocryl. A second imbricating layer of 1-0 moncryl suture was then placed to achieve excellent hemostasis.   The left Fallopian tube was identified by tracing out to the fimbraie, grasped with the Babcock clamps. Using the Ligasure, the mesosalpinx was serially cauterized and cut and then transected from the uterus; the same procedure was done on the other side, with excellent hemostasis was noted from both BTL sites.   The uterus and adnexa were then returned to the abdomen, and the hysterotomy and all operative sites were reinspected and excellent hemostasis was noted after irrigation  and suction of the abdomen with warm saline.  The peritoneum was closed with a running stitch of 3-0 Vicryl. The fascia was  reapproximated with 0 Vicryl in a simple running fashion bilaterally. The subcutaneous layer was then reapproximated with interrupted sutures of 2-0 plain gut, and the skin was then closed with 4-0 monocryl, in a subcuticular fashion.  The patient  tolerated the procedure well. Sponge, lap, needle, and instrument counts were correct x 2. The patient was transferred to the recovery room awake, alert and breathing independently in stable condition.  Cornelia Copa MD Attending Center for [facility])

## 2023-04-20 NOTE — Anesthesia Procedure Notes (Addendum)
Arterial Line Insertion Start/End12/18/2024 8:20 AM, 04/20/2023 8:23 AM Performed by: Elmer Picker, MD, anesthesiologist  Patient location: OB. Preanesthetic checklist: patient identified, IV checked, risks and benefits discussed, surgical consent, monitors and equipment checked, pre-op evaluation and timeout performed Right, radial was placed Catheter size: 20 G Hand hygiene performed  Allen's test indicative of satisfactory collateral circulation Attempts: 1 Procedure performed using ultrasound guided technique. Ultrasound Notes:anatomy identified, needle tip was noted to be adjacent to the nerve/plexus identified and no ultrasound evidence of intravascular and/or intraneural injection Following insertion, dressing applied and Biopatch. Post procedure assessment: normal and unchanged  Patient tolerated the procedure well with no immediate complications.

## 2023-04-20 NOTE — Progress Notes (Signed)
L&D Note  04/20/2023 - 5:34 PM  27 y.o. G2P1001 [redacted]w[redacted]d. 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 is admitted for IOL for cardiac issues   Subjective:  Comfortable with epidural in place. No cardiac or respiratory s/s  Objective:    Current Vital Signs 24h Vital Sign Ranges  T 99.1 F (37.3 C) Temp  Avg: 98.6 F (37 C)  Min: 98.3 F (36.8 C)  Max: 99.1 F (37.3 C)  BP (!) 140/78 BP  Min: 87/48  Max: 140/78  HR 82 Pulse  Avg: 81.5  Min: 71  Max: 115  RR 16 Resp  Avg: 16  Min: 16  Max: 16  SaO2 97 % Room Air SpO2  Avg: 98.1 %  Min: 94 %  Max: 100 %       24 Hour I/O Current Shift I/O  Time Ins Outs No intake/output data recorded. No intake/output data recorded.   Patient Vitals for the past 6 hrs:  BP Temp Pulse SpO2  04/20/23 1721 -- -- 82 97 %  04/20/23 1720 -- -- 87 97 %  04/20/23 1715 -- -- 91 98 %  04/20/23 1710 -- -- 89 97 %  04/20/23 1705 -- -- 84 98 %  04/20/23 1700 (!) 140/78 -- 87 96 %  04/20/23 1655 -- -- 80 97 %  04/20/23 1650 -- -- 85 98 %  04/20/23 1645 -- -- 80 97 %  04/20/23 1640 -- -- 81 96 %  04/20/23 1635 114/62 -- 85 97 %  04/20/23 1630 -- -- 97 96 %  04/20/23 1627 -- 99.1 F (37.3 C) 91 95 %  04/20/23 1625 -- -- 88 97 %  04/20/23 1620 -- -- 86 96 %  04/20/23 1615 -- -- 85 96 %  04/20/23 1610 -- -- 87 96 %  04/20/23 1605 -- -- 87 97 %  04/20/23 1600 124/73 -- 89 97 %   04/20/23 1556 -- -- 91 96 %  04/20/23 1555 -- -- 86 98 %  04/20/23 1551 124/85 -- -- --  04/20/23 1550 -- -- 73 95 %  04/20/23 1545 -- -- 82 96 %  04/20/23 1544 -- -- 75 95 %  04/20/23 1540 -- -- 80 95 %  04/20/23 1535 -- -- 84 97 %  04/20/23 1527 -- -- 79 97 %  04/20/23 1525 -- -- 83 97 %  04/20/23 1520 -- -- 81 97 %  04/20/23 1515 -- -- 83 97 %  04/20/23 1512 -- -- 75 97 %  04/20/23 1510 -- -- 80 96 %  04/20/23 1505 -- -- (!) 103 96 %  04/20/23 1500 -- -- 85 97 %  04/20/23 1455 -- -- 81 97 %  04/20/23 1450 -- -- 86 97 %  04/20/23 1445 -- -- 83 97 %  04/20/23 1438 -- -- 82 97 %  04/20/23 1437 -- -- 79 97 %  04/20/23 1436 -- -- 83 97 %  04/20/23 1435 -- -- 79 96 %  04/20/23 1434 -- -- 77 97 %  04/20/23 1433 -- -- 85 98 %  04/20/23 1432 -- -- 89 97 %  04/20/23 1430 -- -- 79 97 %  04/20/23 1425 -- -- 78 97 %  04/20/23 1420 -- -- 88 96 %  04/20/23 1415 -- -- 88 96 %  04/20/23 1410 -- -- 80 97 %  04/20/23 1401 -- -- 86 97 %  04/20/23 1400 -- -- 84 97 %  04/20/23 1359 -- -- 82 97 %  04/20/23 1358 -- -- 82 97 %  04/20/23 1357 -- -- 83 97 %  04/20/23 1356 -- -- 84 97 %  04/20/23 1355 -- -- 83 --  04/20/23 1346 -- -- 83 98 %  04/20/23 1345 -- -- 81 97 %  04/20/23 1344 -- -- 84 98 %  04/20/23 1343 -- -- 86 99 %  04/20/23 1342 -- -- 82 98 %  04/20/23 1341 -- -- 88 99 %  04/20/23 1340 -- -- 83 97 %  04/20/23 1339 -- -- 85 97 %  04/20/23 1338 -- -- 81 97 %  04/20/23 1337 -- -- 81 97 %  04/20/23 1336 -- -- 78 97 %  04/20/23 1335 -- -- 83 97 %  04/20/23 1334 -- -- 82 97 %  04/20/23 1333 -- -- 82 96 %  04/20/23 1332 -- -- 80 96 %  04/20/23 1331 -- -- 90 96 %  04/20/23 1330 -- -- 80 97 %  04/20/23 1329 -- -- 87 97 %  04/20/23 1328 -- -- 81 96 %  04/20/23 1327 -- -- 85 97 %  04/20/23 1326 -- -- 86 97 %  04/20/23 1325 -- -- 83 97 %  04/20/23 1320 -- -- 80 97 %  04/20/23 1315 -- -- 86 97 %  04/20/23 1310 -- -- 86 97 %  04/20/23 1305 -- -- 79 97 %  04/20/23 1300 -- 98.3  F (36.8 C) 82 96 %  04/20/23 1252 -- -- 83 96 %  04/20/23 1251 -- -- 79 97 %  04/20/23 1250 -- -- 83 98 %  04/20/23 1249 -- -- 89 97 %  04/20/23 1248 -- -- 86 97 %  04/20/23 1247 -- -- 85 97 %  04/20/23 1246 -- -- 90 98 %  04/20/23 1245 -- -- 89 98 %  04/20/23 1244 -- -- 81 98 %  04/20/23 1243 -- -- 84 97 %  04/20/23 1242 -- -- 86 96 %  04/20/23 1241 -- -- 86 97 %  04/20/23 1240 -- -- 83 97 %  04/20/23 1239 -- -- 81 96 %  04/20/23 1238 -- -- 79 97 %  04/20/23 1237 -- -- 82 96 %  04/20/23 1236 -- -- 81 96 %  04/20/23 1235 -- -- 83 96 %  04/20/23 1234 -- -- 83 96 %  04/20/23 1233 -- -- 81 97 %  04/20/23 1232 -- -- 83 95 %  04/20/23 1231 -- -- 85 95 %  04/20/23 1230 (!) 94/52 -- 85 98 %  04/20/23 1229 -- -- 82 96 %  04/20/23 1228 -- -- 82 96 %  04/20/23 1227 -- -- 86 96 %  04/20/23 1226 -- -- 80 96 %  04/20/23 1225 95/71 -- 79 96 %  04/20/23 1224 -- -- 77 95 %  04/20/23 1223 -- -- 79 97 %  04/20/23 1222 -- -- 79 97 %  04/20/23 1221 -- -- 75 96 %  04/20/23 1220 (!) 88/31 -- --  95 %  04/20/23 1219 -- -- 74 97 %  04/20/23 1218 -- -- 73 96 %  04/20/23 1217 -- -- 78 96 %  04/20/23 1216 -- -- 80 96 %  04/20/23 1215 (!) 87/48 -- 80 96 %  04/20/23 1214 -- -- 80 97 %  04/20/23 1213 -- -- 88 97 %  04/20/23 1212 107/73 -- 81 96 %  04/20/23 1211 -- -- 75 97 %  04/20/23 1210 -- -- 82 97 %  04/20/23 1209 -- -- 80 97 %  04/20/23 1208 -- -- 72 97 %  04/20/23 1207 -- -- 80 97 %  04/20/23 1206 -- -- 78 97 %  04/20/23 1205 -- -- 75 97 %  04/20/23 1204 -- -- 73 97 %  04/20/23 1203 -- -- 75 97 %  04/20/23 1202 -- -- 79 97 %  04/20/23 1201 -- -- 75 96 %  04/20/23 1200 109/78 -- 76 96 %  04/20/23 1159 -- -- 77 97 %  04/20/23 1158 -- -- 77 97 %  04/20/23 1157 -- -- 76 97 %  04/20/23 1155 -- -- 78 99 %  04/20/23 1154 -- -- 76 99 %  04/20/23 1153 -- -- 90 100 %  04/20/23 1152 -- -- (!) 102 100 %  04/20/23 1151 -- -- 90 99 %  04/20/23 1150 -- -- 78 98 %  04/20/23 1149 -- -- 79 100  %  04/20/23 1148 -- -- 78 99 %  04/20/23 1147 -- -- 77 99 %  04/20/23 1146 -- -- 77 100 %  04/20/23 1145 -- -- 82 99 %  04/20/23 1144 -- -- 79 99 %  04/20/23 1143 -- -- 79 99 %  04/20/23 1142 -- -- 84 98 %  04/20/23 1141 -- -- 74 99 %  04/20/23 1140 -- -- 78 99 %  04/20/23 1139 -- -- (!) 101 98 %  04/20/23 1138 -- -- 88 99 %  04/20/23 1137 -- -- 75 99 %  04/20/23 1136 -- -- 90 99 %   FHR: 145 baseline, +accels, no decel, mod variability Toco: q61m Gen: NAD SVE: deferred. Just checked by RN and complete/+1  Labs:  Recent Labs  Lab 04/20/23 0617  WBC 5.9  HGB 9.9*  HCT 30.5*  PLT 203  B POS    Latest Ref Rng & Units 05/25/2021    9:47 AM 05/08/2021    4:34 AM 05/07/2021    2:58 PM  CMP  Glucose 70 - 99 mg/dL 88  75  81   BUN 6 - 20 mg/dL 7  14  17    Creatinine 0.57 - 1.00 mg/dL 2.84  1.32  4.40   Sodium 134 - 144 mmol/L 139  132  133   Potassium 3.5 - 5.2 mmol/L 3.6  3.6  3.7   Chloride 96 - 106 mmol/L 105  108  106   CO2 20 - 29 mmol/L 22  18  20    Calcium 8.7 - 10.2 mg/dL 8.4  7.7  8.3    Medications Current Facility-Administered Medications  Medication Dose Route Frequency Provider Last Rate Last Admin   acetaminophen (TYLENOL) tablet 650 mg  650 mg Oral Q4H PRN Arabella Merles, CNM       diphenhydrAMINE (BENADRYL) injection 12.5 mg  12.5 mg Intravenous Q15 min PRN Stoltzfus, Gregory P, DO       fentaNYL (SUBLIMAZE) injection 100 mcg  100 mcg Intravenous Q1H PRN Arabella Merles, CNM  fentaNYL 2 mcg/mL w/ bupivacaine 0.125% in NS 250 mL epidural infusion  12 mL/hr Epidural Continuous PRN Stoltzfus, Gregory P, DO 12 mL/hr at 04/20/23 0951 12 mL/hr at 04/20/23 0951   lactated ringers infusion 500 mL  500 mL Intravenous Once Stoltzfus, Gregory P, DO       lactated ringers infusion 500-1,000 mL  500-1,000 mL Intravenous PRN Arabella Merles, CNM 999 mL/hr at 04/20/23 1545 300 mL at 04/20/23 1545   lactated ringers infusion   Intravenous Continuous Arabella Merles,  CNM 125 mL/hr at 04/20/23 1544 New Bag at 04/20/23 1544   lidocaine (PF) (XYLOCAINE) 1 % injection 30 mL  30 mL Subcutaneous PRN Arabella Merles, CNM       ondansetron [facility]) injection 4 mg  4 mg Intravenous Q6H PRN Arabella Merles, CNM       oxyCODONE-acetaminophen (PERCOCET/ROXICET) 5-325 MG per tablet 1 tablet  1 tablet Oral Q4H PRN Arabella Merles, CNM       oxyCODONE-acetaminophen (PERCOCET/ROXICET) 5-325 MG per tablet 2 tablet  2 tablet Oral Q4H PRN Arabella Merles, CNM       oxytocin (PITOCIN) IV BOLUS FROM BAG  333 mL Intravenous Once Arabella Merles, CNM       oxytocin (PITOCIN) IV infusion 30 units in NS 500 mL - Premix  2.5 Units/hr Intravenous Continuous Cam Hai D, CNM       oxytocin (PITOCIN) IV infusion 30 units in NS 500 mL - Premix  1-40 milli-units/min Intravenous Titrated Arabella Merles, CNM   Stopped at 04/20/23 1546   PHENYLephrine 80 mcg/ml in normal saline Adult IV Push Syringe (For Blood Pressure Support)  80 mcg Intravenous PRN Woodrum, Chelsey L, MD       sodium citrate-citric acid (ORACIT) solution 30 mL  30 mL Oral Q2H PRN Arabella Merles, CNM       terbutaline (BRETHINE) injection 0.25 mg  0.25 mg Subcutaneous Once PRN Cam Hai D, CNM       tranexamic acid (CYKLOKAPRON) 1000MG /148mL IVPB            Facility-Administered Medications Ordered in Other Encounters  Medication Dose Route Frequency Provider Last Rate Last Admin   lidocaine-EPINEPHrine (XYLOCAINE W/EPI) 2 %-1:200000 (PF) injection   Epidural Anesthesia Intra-op Elmer Picker, MD   2 mL at 04/20/23 0948   Assessment & Plan:  Patient doing well *Pregnancy: category I tracing *Labor: pitocin stopped a few hours ago. Will leave off as she seems to get hypotensive on it and uterus get tachysystolic with baby having decels will it. Patient to labor down and re-assess in two hours unless patient having s/s before. D/w her re: need for passive second stage and usage of forceps or vacuum  for maternal benefit and patient amenable to this. EFW similar to prior baby with uncomplicated vac assisted vag delivery. Will do Lysteda during third stage and avoid pitocin.  *CV: Cardiology (Dr. Servando Salina outpatient and s/p inpatient consult during her induction with Dr. Melburn Popper this afternoon) following closely with CCU RN in room. Plan is for patient to go to 1M ICU postpartum. BPs doing well. Phenylephrine okay for hypotension. Over-riding goal is to keep euvolemic and maintain BPs.  -Arterial line and 2 PIVs in place *GBS: negative *Analgesia: epidural working well.   Cornelia Copa MD Attending Center for [facility]

## 2023-04-20 NOTE — Progress Notes (Signed)
LABOR PROGRESS NOTE  Late note entry due to other deliveries.  Sara Lopez is a 27 y.o. G2P1001 at [redacted]w[redacted]d  admitted for IOL for [facility]  Subjective: Patient comfortable with epidural     Objective: BP 111/67   Pulse 81   Temp 99 F (37.2 C)   Resp 16   Ht 5\' 5"  (1.651 m)   Wt 104.4 kg   SpO2 100%   BMI 38.29 kg/m  or  Vitals:   04/20/23 1850 04/20/23 1852 04/20/23 1855 04/20/23 1900  BP:    111/67  Pulse: 76 77 79 81  Resp:      Temp:      TempSrc:      SpO2: 99% 100% 100% 100%  Weight:      Height:       Dilation: 10 Dilation Complete Date: 04/20/23 Dilation Complete Time: 1715 Effacement (%): 90 Cervical Position: Middle Station: Plus 1 Presentation: Vertex Exam by:: m wilkins rnc FHT: baseline rate 145, moderate varibility, + acel, no decel Toco: Every 4 to 5 minutes  Labs: Lab Results  Component Value Date   WBC 5.9 04/20/2023   HGB 9.9 (L) 04/20/2023   HCT 30.5 (L) 04/20/2023   MCV 85.9 04/20/2023   PLT 203 04/20/2023    Patient Active Problem List   Diagnosis Date Noted   History of ventricular fibrillation 04/20/2023   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 G2P1001 at [redacted]w[redacted]d here for IOL for [facility] with ICD   Labor: Progressing well, AROM with copious amounts of clear fluid patient tolerated procedure well.  Planning on passive second stage of labor. Fetal Wellbeing: Category 1 Pain Control: Epidural in place Anticipated MOD: Vaginal delivery  HCOM with ICD: Following with Dr. Servando Salina.   Cardiology in house is on board and monitoring.  Plans for patient to go to medical ICU after delivery for close monitoring.  Derrel Nip, MD Attending Family Medicine Physician, [facility] for [facility]  04/20/2023 7:08 PM

## 2023-04-20 NOTE — H&P (Addendum)
OBSTETRIC ADMISSION HISTORY AND PHYSICAL  Sara Lopez is a 27 y.o. female G2P1001 with IUP at [redacted]w[redacted]d by 16 wk [place] presenting for IOL for HOCM w/ICD. She reports +FMs, No LOF, no VB, no blurry vision, headaches or peripheral edema, and RUQ pain.  She plans on breast feeding. She request BTL for birth control. She received her prenatal care at [facility]   Dating: By [place] --->  Estimated Date of Delivery: 04/27/23  Sono:    @[redacted]w[redacted]d , CWD, normal anatomy, cephalic presentation,3248g, 96% EFW   Prenatal History/Complications:  Patient Active Problem List   Diagnosis Date Noted   History of ventricular fibrillation 04/20/2023   ICD (implantable cardioverter-defibrillator) in place - MDT 01/05/2023   Rubella non-immune status, antepartum 11/18/2022   Unwanted fertility 11/16/2022   Supervision of high risk pregnancy, antepartum 11/09/2022   Red Chart Rounds Patient 12/19/2020   Cardiac pacemaker in situ 03/27/2018   Heart disease 03/27/2018   HOCM (hypertrophic obstructive cardiomyopathy) (HCC) 09/21/2016   Coronary-myocardial bridge    VF (ventricular fibrillation) (HCC) 07/21/2011     Past Medical History: Past Medical History:  Diagnosis Date   ADHD    ADHD (attention deficit hyperactivity disorder) 07/21/2011   Cardiac pacemaker in situ 03/27/2018   Coronary-myocardial bridge    LHC at [facility] 2/14: LAD mid myocardial bridge (patent in diastole and near complete occlusion distally) // Myoview 2/14: Inferior attenuation, no ischemia, normal EF   Depression    Development delay    Developmental delay 07/21/2011   History of cardiac arrest 09/21/2016   History of echocardiogram 08/2016   Echo 5/18: severe asymmetric septal hypertrophy, EF 60-65, dynamic obstruction at rest, peak LVOT 328 cm/sec and peak gradient 43 mmHg, no RWMA, septal thickness 24.6 mm, post wall thickness 15.5 mm, +SAM, mild MR, severe LAE, PASP 30   History of echocardiogram 1.2014 at [facility]   Echo 1/14: Hypertrophic CM,  mild dynamic LVOT - peak 27 mmHg, chordal SAM with LVOT obstruction, mild MR, dilate LCA, abnormal LV diastolic function, hyperdynamic LV systolic function    History of MI (myocardial infarction) 09/16/2016   HOCM (hypertrophic obstructive cardiomyopathy) (HCC)    ICD (implantable cardioverter-defibrillator) in place    Medtronic model D334DRM (Protect), serial number [id number], implanted 04/30/2010    Myocardial infarction (HCC)    hx of elevated Tn levels // LHC in 2014 at [facility] with myocardial bridging   Postpartum visit 06/09/2021   Red Chart Rounds Patient 12/19/2020   Red Chart Rounds Patient Care Plan     Sara Lopez  G1P0  Current GA: [redacted]w[redacted]d          OB History  Gravida  Para  Term  Preterm  AB  Living  1  0  0  0  0  0  SAB  IAB  Ectopic  Multiple  Live Births  0  0  0  0  0        Patient encounter date not specified.        Red Chart Rounds Diagnosis: HOCM s/p remote cardiac arrest with ICD in place, coronary-myocardial bridge     Consultants: Cards,    Single implantable cardioverter-defibrillator (ICD) in situ 07/21/2011   VF (ventricular fibrillation) (HCC)    admitted in 2011 with VF arrest >> dx with HOCM // s/p ICD    Past Surgical History: Past Surgical History:  Procedure Laterality Date   ICD GENERATOR CHANGEOUT N/A 05/10/2018   Procedure: ICD GENERATOR CHANGEOUT;  Surgeon: Graciela Husbands,  Salvatore Decent, MD;  Location: [facility] INVASIVE CV LAB;  Service: Cardiovascular;  Laterality: N/A;   ICD IMPLANT  2011    Obstetrical History: OB History     Gravida  2   Para  1   Term  1   Preterm      AB      Living  1      SAB      IAB      Ectopic      Multiple  0   Live Births  1           Social History Social History   Socioeconomic History   Marital status: Single    Spouse name: Not on file   Number of children: Not on file   Years of education: Not on file   Highest education level: Not on file  Occupational History   Occupation: Conservation officer, nature  Tobacco Use    Smoking status: Former    Current packs/day: 0.00    Types: Cigarettes    Quit date: 07/01/2020    Years since quitting: 2.8   Smokeless tobacco: Former  Building services engineer status: Former   Substances: Nicotine   Devices: not since preg 06/2020  Substance and Sexual Activity   Alcohol use: Not Currently    Alcohol/week: 1.0 standard drink of alcohol    Types: 1 Glasses of wine per week    Comment: not since preg   Drug use: No    Types: Marijuana    Comment: no drugs since 2011   Sexual activity: Not Currently    Birth control/protection: Pill  Other Topics Concern   Not on file  Social History Narrative   Stay at home mom   Social Drivers of Health   Financial Resource Strain: Not on file  Food Insecurity: No Food Insecurity (04/20/2023)   Hunger Vital Sign    Worried About Running Out of Food in the Last Year: Never true    Ran Out of Food in the Last Year: Never true  Transportation Needs: No Transportation Needs (04/20/2023)   PRAPARE - Administrator, Civil Service (Medical): No    Lack of Transportation (Non-Medical): No  Physical Activity: Not on file  Stress: Not on file  Social Connections: Not on file    Family History: Family History  Adopted: Yes    Allergies: No Known Allergies  Medications Prior to Admission  Medication Sig Dispense Refill Last Dose/Taking   cyclobenzaprine (FLEXERIL) 10 MG tablet Take 1 tablet (10 mg total) by mouth 2 (two) times daily as needed for muscle spasms. 20 tablet 0 04/19/2023   metoprolol succinate (TOPROL XL) 25 MG 24 hr tablet Take 0.5 tablets (12.5 mg total) by mouth daily. 45 tablet 3 Past Week   pantoprazole (PROTONIX) 20 MG tablet Take 1 tablet (20 mg total) by mouth daily. 30 tablet 22 Past Month   Prenatal Vit-Fe Fumarate-FA (MULTIVITAMIN-PRENATAL) 27-0.8 MG TABS tablet Take 1 tablet by mouth daily at 12 noon.   Past Week     Review of Systems   All systems reviewed and negative except as stated  in HPI  Blood pressure 119/77, pulse 73, temperature 98.3 F (36.8 C), temperature source Oral, resp. rate 16, height 5\' 5"  (1.651 m), weight 104.4 kg, SpO2 96%, currently breastfeeding. General appearance: alert, cooperative, and no distress Lungs: clear to auscultation bilaterally Heart: regular rate and rhythm Abdomen: soft, non-tender; bowel sounds normal Extremities: Homans sign is  negative, no sign of DVT  Fetal monitoringBaseline: 130 bpm, Variability: Good {> 6 bpm), Accelerations: Reactive, and Decelerations: Absent Uterine activity: Irregular     Prenatal labs: ABO, Rh: B/Positive/-- (07/16 1542) Antibody: Negative (07/16 1542) Rubella: 0.99 (07/16 1542) RPR: Non Reactive (10/03 0815)  HBsAg: Negative (07/16 1542)  HIV: Non Reactive (10/03 0815)  GBS: Negative/-- (11/21 0445)  2 hr Glucola normal Genetic screening  LR NIPS female Anatomy US Normal  Prenatal Transfer Tool  Maternal Diabetes: No Genetic Screening: Normal Maternal Ultrasounds/Referrals: Normal Fetal Ultrasounds or other Referrals:  Fetal echo; normal Maternal Substance Abuse:  No Significant Maternal Medications:  Meds include: Other:  Metoprolol, protonix Significant Maternal Lab Results:  Group B Strep negative, RNI Number of Prenatal Visits:greater than 3 verified prenatal visits Other Comments:  None  Results for orders placed or performed during the hospital encounter of 04/19/23 (from the past 24 hours)  Urinalysis, Routine w reflex microscopic -Urine, Clean Catch   Collection Time: 04/19/23  5:50 PM  Result Value Ref Range   Color, Urine YELLOW YELLOW   APPearance HAZY (A) CLEAR   Specific Gravity, Urine 1.021 1.005 - 1.030   pH 5.0 5.0 - 8.0   Glucose, UA NEGATIVE NEGATIVE mg/dL   Hgb urine dipstick NEGATIVE NEGATIVE   Bilirubin Urine NEGATIVE NEGATIVE   Ketones, ur NEGATIVE NEGATIVE mg/dL   Protein, ur NEGATIVE NEGATIVE mg/dL   Nitrite NEGATIVE NEGATIVE   Leukocytes,Ua SMALL (A)  NEGATIVE   RBC / HPF 0-5 0 - 5 RBC/hpf   WBC, UA 6-10 0 - 5 WBC/hpf   Bacteria, UA RARE (A) NONE SEEN   Squamous Epithelial / HPF 6-10 0 - 5 /HPF   Mucus PRESENT     Patient Active Problem List   Diagnosis Date Noted   History of ventricular fibrillation 04/20/2023   ICD (implantable cardioverter-defibrillator) in place - MDT 01/05/2023   Rubella non-immune status, antepartum 11/18/2022   Unwanted fertility 11/16/2022   Supervision of high risk pregnancy, antepartum 11/09/2022   Red Chart Rounds Patient 12/19/2020   Cardiac pacemaker in situ 03/27/2018   Heart disease 03/27/2018   HOCM (hypertrophic obstructive cardiomyopathy) (HCC) 09/21/2016   Coronary-myocardial bridge    VF (ventricular fibrillation) (HCC) 07/21/2011    Assessment/Plan:  Sara Lopez is a 27 y.o. G2P1001 at [redacted]w[redacted]d here for IOL for HOCM w/ICD  #Labor: Plan on A line, 2 peripheral IVs. Notify/consult cards (Dr Servando Salina will talk to MD on-call ahead of time) No ephedrine for hypotension.  Passive second stage   AVOID DIURETICS (no lasix)   Plan to recover in ICU for postpartum and will leave A line in until after BTL. Will likely need to go back to ICU after BTL.   #Pain: Epidural prior to AROM. 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 Tawanna Cooler - Plan for passive second stage of labor - Continue metoprolol   Unwanted fertility - Planning on postpartum tubal/BTS.  Papers signed 10/24   Rubella non-immune status, antepartum - Postpartum MMR  Milas Hock, MD  04/20/2023, 6:51 AM

## 2023-04-20 NOTE — Progress Notes (Addendum)
L&D Note  04/20/2023 - 7:43 PM  27 y.o. G2P1001 [redacted]w[redacted]d. 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 Marcellene Raggs is admitted for IOL for cardiac issues   Subjective:  Comfortable with epidural in place and still not feeling any contractions. No cardiac or respiratory s/s  Objective:    Current Vital Signs 24h Vital Sign Ranges  T 99 F (37.2 C) Temp  Avg: 98.7 F (37.1 C)  Min: 98.3 F (36.8 C)  Max: 99.1 F (37.3 C)  BP 109/73 BP  Min: 87/48  Max: 140/78  HR 82 Pulse  Avg: 81.6  Min: 71  Max: 115  RR 16 Resp  Avg: 16  Min: 16  Max: 16  SaO2 100 % Room Air SpO2  Avg: 98.2 %  Min: 94 %  Max: 100 %       24 Hour I/O Current Shift I/O  Time Ins Outs No intake/output data recorded. No intake/output data recorded.   Patient Vitals for the past 6 hrs:  BP Temp Pulse SpO2  04/20/23 1938 109/73 -- 82 100 %  04/20/23 1930 -- -- 79 100 %  04/20/23 1926 -- -- 80 100 %  04/20/23 1900 111/67 -- 81 100 %  04/20/23 1855 -- -- 79 100 %  04/20/23 1852 -- -- 77 100 %  04/20/23 1850 -- -- 76 99 %  04/20/23 1845 -- -- 78 100 %  04/20/23 1840 -- -- 80 100 %  04/20/23 1835 -- -- 81 99 %  04/20/23 1830 -- -- 78 100 %  04/20/23 1825 -- -- 85 99 %  04/20/23 1820 -- -- 87 100 %  04/20/23 1815 -- -- 84 100 %  04/20/23 1810 -- -- 87 100 %  04/20/23 1805 -- -- 86 99 %  04/20/23 1800 112/70 -- 87 100 %  04/20/23 1755 -- -- 95 100 %   04/20/23 1750 -- -- 81 99 %  04/20/23 1745 -- -- 86 98 %  04/20/23 1740 -- -- 77 97 %  04/20/23 1736 111/63 -- -- --  04/20/23 1735 -- -- 92 98 %  04/20/23 1730 -- 99 F (37.2 C) 87 97 %  04/20/23 1721 -- -- 82 97 %  04/20/23 1720 -- -- 87 97 %  04/20/23 1715 -- -- 91 98 %  04/20/23 1710 -- -- 89 97 %  04/20/23 1705 -- -- 84 98 %  04/20/23 1700 (!) 140/78 -- 87 96 %  04/20/23 1655 -- -- 80 97 %  04/20/23 1650 -- -- 85 98 %  04/20/23 1645 -- -- 80 97 %  04/20/23 1640 -- -- 81 96 %  04/20/23 1635 114/62 -- 85 97 %  04/20/23 1630 -- -- 97 96 %  04/20/23 1627 -- 99.1 F (37.3 C) 91 95 %  04/20/23 1625 -- -- 88 97 %  04/20/23 1620 -- -- 86 96 %  04/20/23 1615 -- -- 85 96 %  04/20/23 1610 -- -- 87 96 %  04/20/23 1605 -- -- 87 97 %  04/20/23 1600 124/73 -- 89 97 %  04/20/23 1556 -- -- 91 96 %  04/20/23 1555 -- -- 86 98 %  04/20/23 1551 124/85 -- -- --  04/20/23 1550 -- -- 73 95 %  04/20/23 1545 -- -- 82 96 %  04/20/23 1544 -- -- 75 95 %  04/20/23 1540 -- -- 80 95 %  04/20/23 1535 -- -- 84 97 %  04/20/23 1527 -- -- 79 97 %  04/20/23 1525 -- -- 83 97 %  04/20/23 1520 -- -- 81 97 %  04/20/23 1515 -- -- 83 97 %  04/20/23 1512 -- -- 75 97 %  04/20/23 1510 -- -- 80 96 %  04/20/23 1505 -- -- (!) 103 96 %  04/20/23 1500 -- -- 85 97 %  04/20/23 1455 -- -- 81 97 %  04/20/23 1450 -- -- 86 97 %  04/20/23 1445 -- -- 83 97 %  04/20/23 1438 -- -- 82 97 %  04/20/23 1437 -- -- 79 97 %  04/20/23 1436 -- -- 83 97 %  04/20/23 1435 -- -- 79 96 %  04/20/23 1434 -- -- 77 97 %  04/20/23 1433 -- -- 85 98 %  04/20/23 1432 -- -- 89 97 %  04/20/23 1430 -- -- 79 97 %  04/20/23 1425 -- -- 78 97 %  04/20/23 1420 -- -- 88 96 %  04/20/23 1415 -- -- 88 96 %  04/20/23 1410 -- -- 80 97 %  04/20/23 1401 -- -- 86 97 %  04/20/23 1400 -- -- 84 97 %  04/20/23 1359 -- -- 82 97 %  04/20/23 1358 -- -- 82 97 %  04/20/23 1357 -- -- 83 97 %  04/20/23 1356 -- -- 84 97 %  04/20/23 1355 -- -- 83 --   04/20/23 1346 -- -- 83 98 %  04/20/23 1345 -- -- 81 97 %  04/20/23 1344 -- -- 84 98 %   FHR: 145 baseline, +accels, no decel, mod variability Toco: q60m Gen: NAD SVE: 9 (lip and at 10 o'clock)/80/BPD 0 with leading edge to +1  Labs:  Recent Labs  Lab 04/20/23 0617  WBC 5.9  HGB 9.9*  HCT 30.5*  PLT 203  B POS    Latest Ref Rng & Units 05/25/2021    9:47 AM 05/08/2021    4:34 AM 05/07/2021    2:58 PM  CMP  Glucose 70 - 99 mg/dL 88  75  81   BUN 6 - 20 mg/dL 7  14  17    Creatinine 0.57 - 1.00 mg/dL 9.52  8.41  3.24   Sodium 134 - 144 mmol/L 139  132  133   Potassium 3.5 - 5.2 mmol/L 3.6  3.6  3.7   Chloride 96 - 106 mmol/L 105  108  106   CO2 20 - 29 mmol/L 22  18  20    Calcium 8.7 - 10.2 mg/dL 8.4  7.7  8.3    Medications Current Facility-Administered Medications  Medication Dose Route Frequency Provider Last Rate Last Admin   acetaminophen (TYLENOL) tablet 650 mg  650 mg Oral Q4H PRN Cam Hai D, CNM       diphenhydrAMINE (BENADRYL) injection 12.5 mg  12.5 mg Intravenous Q15 min PRN Stoltzfus, Gregory P, DO       fentaNYL (SUBLIMAZE) injection 100 mcg  100 mcg Intravenous Q1H PRN Arabella Merles, CNM  fentaNYL 2 mcg/mL w/ bupivacaine 0.125% in NS 250 mL epidural infusion  12 mL/hr Epidural Continuous PRN Stoltzfus, Gregory P, DO 12 mL/hr at 04/20/23 0951 12 mL/hr at 04/20/23 0951   lactated ringers infusion 500 mL  500 mL Intravenous Once Stoltzfus, Gregory P, DO       lactated ringers infusion 500-1,000 mL  500-1,000 mL Intravenous PRN Arabella Merles, CNM 999 mL/hr at 04/20/23 1545 300 mL at 04/20/23 1545   lactated ringers infusion   Intravenous Continuous Arabella Merles, CNM 125 mL/hr at 04/20/23 1544 New Bag at 04/20/23 1544   lidocaine (PF) (XYLOCAINE) 1 % injection 30 mL  30 mL Subcutaneous PRN Arabella Merles, CNM       ondansetron [facility]) injection 4 mg  4 mg Intravenous Q6H PRN Arabella Merles, CNM       oxyCODONE-acetaminophen (PERCOCET/ROXICET)  5-325 MG per tablet 1 tablet  1 tablet Oral Q4H PRN Arabella Merles, CNM       oxyCODONE-acetaminophen (PERCOCET/ROXICET) 5-325 MG per tablet 2 tablet  2 tablet Oral Q4H PRN Arabella Merles, CNM       oxytocin (PITOCIN) IV BOLUS FROM BAG  333 mL Intravenous Once Arabella Merles, CNM       oxytocin (PITOCIN) IV infusion 30 units in NS 500 mL - Premix  2.5 Units/hr Intravenous Continuous Cam Hai D, CNM       oxytocin (PITOCIN) IV infusion 30 units in NS 500 mL - Premix  1-40 milli-units/min Intravenous Titrated Arabella Merles, CNM   Stopped at 04/20/23 1546   PHENYLephrine 80 mcg/ml in normal saline Adult IV Push Syringe (For Blood Pressure Support)  80 mcg Intravenous PRN Woodrum, Chelsey L, MD       sodium citrate-citric acid (ORACIT) solution 30 mL  30 mL Oral Q2H PRN Arabella Merles, CNM       terbutaline (BRETHINE) injection 0.25 mg  0.25 mg Subcutaneous Once PRN Cam Hai D, CNM       tranexamic acid (CYKLOKAPRON) 1000MG /121mL IVPB            Facility-Administered Medications Ordered in Other Encounters  Medication Dose Route Frequency Provider Last Rate Last Admin   lidocaine-EPINEPHrine (XYLOCAINE W/EPI) 2 %-1:200000 (PF) injection   Epidural Anesthesia Intra-op Elmer Picker, MD   2 mL at 04/20/23 0948   Assessment & Plan:  Patient doing well *Pregnancy: category I tracing with +scalp stim *Labor: pitocin currently at 1. Will go up by 1mU q71m/slowly given prior hypotension and tachysystole and fetal decels earlier today.  D/w pt already re: passive 2nd stage and need for OVD.  Will do Lysteda during third stage and avoid pitocin.  *CV: Cardiology (Dr. Servando Salina outpatient and s/p inpatient consult during her induction with Dr. Melburn Popper this afternoon) following closely with CCU RN in room. BPs doing well; over-riding goal is to keep euvolemic and maintain BPs.  -Plan is for patient to go to 40M ICU postpartum  -Phenylephrine okay for hypotension.  -Arterial line and 2  PIVs in place *GBS: negative *Analgesia: epidural working well.   Cornelia Copa MD Attending Center for [facility])   ADDENDUM Just lost art line. Will hold off on going up on pit until line re-established Cornelia Copa MD Attending Center for [employer] (Faculty Practice) 04/20/2023 Time: [phone]

## 2023-04-21 ENCOUNTER — Encounter (HOSPITAL_COMMUNITY): Payer: Self-pay | Admitting: Family Medicine

## 2023-04-21 DIAGNOSIS — I959 Hypotension, unspecified: Secondary | ICD-10-CM

## 2023-04-21 DIAGNOSIS — R000 Tachycardia, unspecified: Secondary | ICD-10-CM | POA: Diagnosis not present

## 2023-04-21 DIAGNOSIS — I421 Obstructive hypertrophic cardiomyopathy: Secondary | ICD-10-CM | POA: Diagnosis not present

## 2023-04-21 DIAGNOSIS — Z8679 Personal history of other diseases of the circulatory system: Secondary | ICD-10-CM | POA: Diagnosis not present

## 2023-04-21 LAB — CBC
HCT: 24.1 % — ABNORMAL LOW (ref 36.0–46.0)
HCT: 24.7 % — ABNORMAL LOW (ref 36.0–46.0)
Hemoglobin: 8 g/dL — ABNORMAL LOW (ref 12.0–15.0)
Hemoglobin: 8.1 g/dL — ABNORMAL LOW (ref 12.0–15.0)
MCH: 27.8 pg (ref 26.0–34.0)
MCH: 28.7 pg (ref 26.0–34.0)
MCHC: 32.8 g/dL (ref 30.0–36.0)
MCHC: 33.2 g/dL (ref 30.0–36.0)
MCV: 84.9 fL (ref 80.0–100.0)
MCV: 86.4 fL (ref 80.0–100.0)
Platelets: 182 10*3/uL (ref 150–400)
Platelets: 215 10*3/uL (ref 150–400)
RBC: 2.79 MIL/uL — ABNORMAL LOW (ref 3.87–5.11)
RBC: 2.91 MIL/uL — ABNORMAL LOW (ref 3.87–5.11)
RDW: 12.9 % (ref 11.5–15.5)
RDW: 13.2 % (ref 11.5–15.5)
WBC: 13 10*3/uL — ABNORMAL HIGH (ref 4.0–10.5)
WBC: 13.5 10*3/uL — ABNORMAL HIGH (ref 4.0–10.5)
nRBC: 0 % (ref 0.0–0.2)
nRBC: 0 % (ref 0.0–0.2)

## 2023-04-21 LAB — PROTIME-INR
INR: 1.1 (ref 0.8–1.2)
Prothrombin Time: 14.7 s (ref 11.4–15.2)

## 2023-04-21 LAB — COMPREHENSIVE METABOLIC PANEL
ALT: 11 U/L (ref 0–44)
AST: 20 U/L (ref 15–41)
Albumin: 2 g/dL — ABNORMAL LOW (ref 3.5–5.0)
Alkaline Phosphatase: 102 U/L (ref 38–126)
Anion gap: 9 (ref 5–15)
BUN: 8 mg/dL (ref 6–20)
CO2: 17 mmol/L — ABNORMAL LOW (ref 22–32)
Calcium: 7.6 mg/dL — ABNORMAL LOW (ref 8.9–10.3)
Chloride: 105 mmol/L (ref 98–111)
Creatinine, Ser: 0.56 mg/dL (ref 0.44–1.00)
GFR, Estimated: >60 mL/min (ref >60)
Glucose, Bld: 121 mg/dL — ABNORMAL HIGH (ref 70–99)
Potassium: 3.7 mmol/L (ref 3.5–5.1)
Sodium: 131 mmol/L — ABNORMAL LOW (ref 135–145)
Total Bilirubin: 0.6 mg/dL (ref <1.2)
Total Protein: 4.5 g/dL — ABNORMAL LOW (ref 6.5–8.1)

## 2023-04-21 LAB — BASIC METABOLIC PANEL
Anion gap: 8 (ref 5–15)
BUN: 8 mg/dL (ref 6–20)
CO2: 21 mmol/L — ABNORMAL LOW (ref 22–32)
Calcium: 8.4 mg/dL — ABNORMAL LOW (ref 8.9–10.3)
Chloride: 105 mmol/L (ref 98–111)
Creatinine, Ser: 0.61 mg/dL (ref 0.44–1.00)
GFR, Estimated: >60 mL/min (ref >60)
Glucose, Bld: 100 mg/dL — ABNORMAL HIGH (ref 70–99)
Potassium: 4.5 mmol/L (ref 3.5–5.1)
Sodium: 134 mmol/L — ABNORMAL LOW (ref 135–145)

## 2023-04-21 LAB — PHOSPHORUS
Phosphorus: 3.3 mg/dL (ref 2.5–4.6)
Phosphorus: 3.7 mg/dL (ref 2.5–4.6)

## 2023-04-21 LAB — MAGNESIUM
Magnesium: 1.3 mg/dL — ABNORMAL LOW (ref 1.7–2.4)
Magnesium: 1.9 mg/dL (ref 1.7–2.4)

## 2023-04-21 LAB — GLUCOSE, CAPILLARY
Glucose-Capillary: 116 mg/dL — ABNORMAL HIGH (ref 70–99)
Glucose-Capillary: 117 mg/dL — ABNORMAL HIGH (ref 70–99)

## 2023-04-21 LAB — MRSA NEXT GEN BY PCR, NASAL: MRSA by PCR Next Gen: NOT DETECTED

## 2023-04-21 MED ORDER — ACETAMINOPHEN 160 MG/5ML PO SOLN
960.0000 mg | Freq: Once | ORAL | Status: DC
Start: 2023-04-21 — End: 2023-04-21

## 2023-04-21 MED ORDER — OXYCODONE HCL 5 MG PO TABS: 5.0000 mg | ORAL_TABLET | Freq: Once | ORAL | Status: DC | PRN

## 2023-04-21 MED ORDER — FENTANYL CITRATE (PF) 100 MCG/2ML IJ SOLN: 25.0000 ug | INTRAMUSCULAR | Status: DC | PRN

## 2023-04-21 MED ORDER — CHLORHEXIDINE GLUCONATE 0.12 % MT SOLN: 15.0000 mL | Freq: Once | OROMUCOSAL | Status: DC

## 2023-04-21 MED ORDER — OXYCODONE HCL 5 MG PO TABS
5.0000 mg | ORAL_TABLET | ORAL | Status: DC | PRN
  Administered 2023-04-21 – 2023-04-23 (×4): 5 mg via ORAL
  Filled 2023-04-21 (×4): qty 1

## 2023-04-21 MED ORDER — DIPHENHYDRAMINE HCL 25 MG PO CAPS
25.0000 mg | ORAL_CAPSULE | ORAL | Status: DC | PRN
  Administered 2023-04-21: 25 mg via ORAL
  Filled 2023-04-21: qty 1

## 2023-04-21 MED ORDER — CHLORHEXIDINE GLUCONATE CLOTH 2 % EX PADS
6.0000 | MEDICATED_PAD | Freq: Every day | CUTANEOUS | Status: DC
Start: 2023-04-21 — End: 2023-04-23
  Administered 2023-04-21 – 2023-04-23 (×3): 6 via TOPICAL

## 2023-04-21 MED ORDER — SODIUM CHLORIDE 0.9% FLUSH: 3.0000 mL | INTRAVENOUS | Status: DC | PRN

## 2023-04-21 MED ORDER — DOCUSATE SODIUM 100 MG PO CAPS: 100.0000 mg | ORAL_CAPSULE | Freq: Two times a day (BID) | ORAL | Status: DC | PRN

## 2023-04-21 MED ORDER — SIMETHICONE 80 MG PO CHEW: 80.0000 mg | CHEWABLE_TABLET | ORAL | Status: DC | PRN

## 2023-04-21 MED ORDER — POLYETHYLENE GLYCOL 3350 17 G PO PACK: 17.0000 g | PACK | Freq: Every day | ORAL | Status: DC | PRN

## 2023-04-21 MED ORDER — COCONUT OIL OIL: 1.0000 | TOPICAL_OIL | Status: DC | PRN

## 2023-04-21 MED ORDER — KETOROLAC TROMETHAMINE 30 MG/ML IJ SOLN: 30.0000 mg | Freq: Four times a day (QID) | INTRAMUSCULAR | Status: AC | PRN

## 2023-04-21 MED ORDER — ACETAMINOPHEN 500 MG PO TABS: 1000.0000 mg | ORAL_TABLET | Freq: Once | ORAL | Status: DC

## 2023-04-21 MED ORDER — SODIUM CHLORIDE 0.9 % IV SOLN: 12.5000 mg | INTRAVENOUS | Status: DC | PRN

## 2023-04-21 MED ORDER — PROMETHAZINE (PHENERGAN) 6.25MG IN NS 50ML IVPB: 6.2500 mg | Freq: Four times a day (QID) | INTRAVENOUS | Status: DC | PRN

## 2023-04-21 MED ORDER — DIPHENHYDRAMINE HCL 50 MG/ML IJ SOLN: 12.5000 mg | INTRAMUSCULAR | Status: DC | PRN

## 2023-04-21 MED ORDER — SCOPOLAMINE 1 MG/3DAYS TD PT72
1.0000 | MEDICATED_PATCH | Freq: Once | TRANSDERMAL | Status: DC
Start: 2023-04-21 — End: 2023-04-21

## 2023-04-21 MED ORDER — MENTHOL 3 MG MT LOZG
1.0000 | LOZENGE | OROMUCOSAL | Status: DC | PRN
Start: 2023-04-21 — End: 2023-04-23

## 2023-04-21 MED ORDER — GABAPENTIN 100 MG PO CAPS
200.0000 mg | ORAL_CAPSULE | Freq: Two times a day (BID) | ORAL | Status: DC
  Administered 2023-04-21 – 2023-04-23 (×4): 200 mg via ORAL
  Filled 2023-04-21 (×4): qty 2

## 2023-04-21 MED ORDER — AMISULPRIDE (ANTIEMETIC) 5 MG/2ML IV SOLN: 10.0000 mg | Freq: Once | INTRAVENOUS | Status: DC | PRN

## 2023-04-21 MED ORDER — SOD CITRATE-CITRIC ACID 500-334 MG/5ML PO SOLN: 30.0000 mL | Freq: Once | ORAL | Status: DC

## 2023-04-21 MED ORDER — POLYETHYLENE GLYCOL 3350 17 G PO PACK
17.0000 g | PACK | Freq: Every day | ORAL | Status: DC
  Administered 2023-04-22 – 2023-04-23 (×2): 17 g via ORAL
  Filled 2023-04-21 (×2): qty 1

## 2023-04-21 MED ORDER — SCOPOLAMINE 1 MG/3DAYS TD PT72
1.0000 | MEDICATED_PATCH | Freq: Once | TRANSDERMAL | Status: DC
  Filled 2023-04-21: qty 1

## 2023-04-21 MED ORDER — OXYCODONE HCL 5 MG/5ML PO SOLN: 5.0000 mg | Freq: Once | ORAL | Status: DC | PRN

## 2023-04-21 MED ORDER — IBUPROFEN 600 MG PO TABS
600.0000 mg | ORAL_TABLET | Freq: Four times a day (QID) | ORAL | Status: DC
  Administered 2023-04-21 – 2023-04-23 (×8): 600 mg via ORAL
  Filled 2023-04-21 (×8): qty 1

## 2023-04-21 MED ORDER — FAMOTIDINE 20 MG PO TABS: 20.0000 mg | ORAL_TABLET | Freq: Once | ORAL | Status: DC

## 2023-04-21 MED ORDER — MAGNESIUM SULFATE 4 GM/100ML IV SOLN
4.0000 g | Freq: Once | INTRAVENOUS | Status: AC
  Administered 2023-04-21: 4 g via INTRAVENOUS
  Filled 2023-04-21: qty 100

## 2023-04-21 MED ORDER — DIPHENHYDRAMINE HCL 25 MG PO CAPS: 25.0000 mg | ORAL_CAPSULE | Freq: Four times a day (QID) | ORAL | Status: DC | PRN

## 2023-04-21 MED ORDER — SIMETHICONE 80 MG PO CHEW
80.0000 mg | CHEWABLE_TABLET | Freq: Three times a day (TID) | ORAL | Status: DC
Start: 2023-04-22 — End: 2023-04-23
  Administered 2023-04-22 – 2023-04-23 (×5): 80 mg via ORAL
  Filled 2023-04-21 (×5): qty 1

## 2023-04-21 MED ORDER — ENOXAPARIN SODIUM 60 MG/0.6ML IJ SOSY
50.0000 mg | PREFILLED_SYRINGE | INTRAMUSCULAR | Status: DC
  Administered 2023-04-21 – 2023-04-22 (×2): 50 mg via SUBCUTANEOUS
  Filled 2023-04-21 (×2): qty 0.6

## 2023-04-21 MED ORDER — LACTATED RINGERS IV SOLN
INTRAVENOUS | Status: DC
Start: 2023-04-21 — End: 2023-04-21

## 2023-04-21 MED ORDER — MAGNESIUM SULFATE 2 GM/50ML IV SOLN: 2.0000 g | Freq: Once | INTRAVENOUS | Status: DC

## 2023-04-21 MED ORDER — ORAL CARE MOUTH RINSE: 15.0000 mL | Freq: Once | OROMUCOSAL | Status: DC

## 2023-04-21 MED ORDER — NALOXONE HCL 4 MG/10ML IJ SOLN: 1.0000 ug/kg/h | INTRAVENOUS | Status: DC | PRN

## 2023-04-21 MED ORDER — SENNOSIDES-DOCUSATE SODIUM 8.6-50 MG PO TABS: 2.0000 | ORAL_TABLET | Freq: Every evening | ORAL | Status: DC | PRN

## 2023-04-21 MED ORDER — ONDANSETRON HCL 4 MG/2ML IJ SOLN: 4.0000 mg | Freq: Three times a day (TID) | INTRAMUSCULAR | Status: DC | PRN

## 2023-04-21 MED ORDER — PRENATAL MULTIVITAMIN CH
1.0000 | ORAL_TABLET | Freq: Every day | ORAL | Status: DC
Start: 2023-04-22 — End: 2023-04-23
  Administered 2023-04-22 – 2023-04-23 (×2): 1 via ORAL
  Filled 2023-04-21 (×2): qty 1

## 2023-04-21 MED ORDER — NALOXONE HCL 0.4 MG/ML IJ SOLN
0.4000 mg | INTRAMUSCULAR | Status: DC | PRN
Start: 2023-04-21 — End: 2023-04-23

## 2023-04-21 NOTE — Progress Notes (Signed)
Pt transferred to [facility] first floor report given pt OOB to w/c tolerated fair belongings with pt. Pt stable on transfer

## 2023-04-21 NOTE — Progress Notes (Signed)
RROB nurse over to see pt and perform fundal rub.  Uterus is 1 below umbilicus.  Pt has no OB complaints at this time.  Her cheeks are very rosy colored at this time.  When asked about this, pt says that she is just very hot.  Batteries switched out in her fan.  Nursery contacted and they will bring infant into see her soon.  Dr Debroah Loop made aware of pt status and says that he will be over to see pt soon.

## 2023-04-21 NOTE — Progress Notes (Signed)
RROB asked to come and assess pt's abdomen which seems distended to the [facility] nurse.  Upon assessment, pts uterus is still 1 below umbilicus but is definitely distended with gas.  Abdomen is soft to the touch and nonpainful to pt.  Bowel sounds are hypoactive at this time. Pt now appears to have a rash on her face instead of just a rosey color.  Pt reports that her face was very itchy earlier, but is now feeling better.  ICU nurse agrees that her face does look better at this time. Dr Debroah Loop to put in transfer orders for pt to move down to [facility].

## 2023-04-21 NOTE — Progress Notes (Signed)
NAME:  Sara Lopez, MRN:  [id number], DOB:  [birth year], LOS: 1 ADMISSION DATE:  04/20/2023 CONSULTATION DATE: 04/20/2023 REFERRING MD:  Vergie Living - [facility] CHIEF COMPLAINT:  High-risk postpartum   History of Present Illness:  27 year old woman who presented to [facility] ICU 12/18 as a transfer from [facility] postpartum (C-section). High-risk pregnancy in the setting of HOCM, previous postpartum hemorrhage. PMHx significant for HOCM (s/p ICD placement) c/b VT arrest, mild developmental delay, ADHD, depression.  Patient initially presented to [facility] 12/18 for IOL (GA [redacted]wk0d, G2P1001). Initial plan was for attempted vaginal delivery, however patient was unable to tolerate Pitocin (hypotension) and had fetal intolerance of labor prompting C-section. Patient was taken to OR 12/18PM for C-section and bilateral tubal ligation. Intraoperative course was uncomplicated with 1L EBL and patient was transferred to [facility] ICU postoperatively for close monitoring.  PCCM consulted for ICU admission/management.  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   Coronary-myocardial bridge    LHC at [facility] 2/14: LAD mid myocardial bridge (patent in diastole and near complete occlusion distally) // Myoview 2/14: Inferior attenuation, no ischemia, normal EF   Depression    Developmental delay 07/21/2011   History of cardiac arrest 09/21/2016   History of echocardiogram 08/2016   Echo 5/18: severe asymmetric septal hypertrophy, EF 60-65, dynamic obstruction at rest, peak LVOT 328 cm/sec and peak gradient 43 mmHg, no RWMA, septal thickness 24.6 mm, post wall thickness 15.5 mm, +SAM, mild MR, severe LAE, PASP 30   History of echocardiogram 1.2014 at [facility]   Echo 1/14: Hypertrophic CM, mild dynamic LVOT - peak 27 mmHg, chordal SAM with LVOT obstruction, mild MR, dilate LCA, abnormal LV diastolic function,  hyperdynamic LV systolic function    History of MI (myocardial infarction) 09/16/2016   HOCM (hypertrophic obstructive cardiomyopathy) (HCC)    ICD (implantable cardioverter-defibrillator) in place    Medtronic model D334DRM (Protect), serial number [id number], implanted 04/30/2010    Myocardial infarction (HCC)    hx of elevated Tn levels // LHC in 2014 at [facility] with myocardial bridging   Red Chart Rounds Patient 12/19/2020   Red Chart Rounds Patient Care Plan     [place] [facility]  G1P0  Current GA: [redacted]w[redacted]d          OB History  Gravida  Para  Term  Preterm  AB  Living  1  0  0  0  0  0  SAB  IAB  Ectopic  Multiple  Live Births  0  0  0  0  0        Patient encounter date not specified.        Red Chart Rounds Diagnosis: HOCM s/p remote cardiac arrest with ICD in place, coronary-myocardial bridge     Consultants: Cards,    Single implantable cardioverter-defibrillator (ICD) in situ 07/21/2011   VF (ventricular fibrillation) (HCC)    admitted in 2011 with VF arrest >> dx with HOCM // s/p ICD   Significant Hospital Events: Including procedures, antibiotic start and stop dates in addition to other pertinent events   12/18 - Presented to [facility] for IOL, tentative plan for vaginal delivery but slow progression, maternal intolerance of Pitocin (hypotension) and fetal intolerance of labor; subsequently taken to OR for C-section/BTL. Transferred to 33M ICU postoperatively.  Interim History / Subjective:  Phenylephrine quickly and off, did not require overnight Some relative tachycardia,  hypotension but acceptable.  Cardiology following She has had itching, face, limbs and trunk Starting a diet this morning Baby doing well   Objective:  Blood pressure 103/69, pulse 81, temperature 98.1 F (36.7 C), temperature source Oral, resp. rate 13, height 5\' 5"  (1.651 m), weight 103.7 kg, SpO2 94%, unknown if currently breastfeeding.        Intake/Output Summary (Last 24 hours) at 04/21/2023  0907 Last data filed at 04/21/2023 0800 Gross per 24 hour  Intake 6325.7 ml  Output 2330 ml  Net 3995.7 ml   Filed Weights   04/20/23 0635 04/21/23 0021  Weight: 104.4 kg 103.7 kg   Physical Examination: General: Comfortable well-appearing woman laying in bed in no distress HEENT: Some periorbital and facial erythema, slightly papular Neuro: Awake, alert, appropriate, nonfocal CV: Regular, 80-90 on monitor, ICD in place PULM: Comfortable respiratory pattern GI: Dressing intact, hypoactive BS Extremities: Trace bilateral lower extremity edema Skin: Mild periorbital and nasal rash with some slight papular change  Resolved Hospital Problem List:    Assessment & Plan:  Postpartum, high risk in the setting of HOCM S/p C-section, bilateral tubal ligation History of postpartum hemorrhage -Postoperative management, fundal checks, etc. as per OB team -Pain control  HOCM ICD in situ History of VT cardiac arrest S/p ICD placement. Echo 05/2021 with EF 65-70%, no significant outflow tract gradient, moderate LVH, G1DD, mildly enlarged, small pericardial effusion. Followed by Dr. Servando Salina (Cardiology).  -Appreciate Dr. Harvie Bridge input.  Patient doing well -Work for euvolemia, IV fluids if needed.  Diet started 12/19 -Telemetry monitoring -Optimize electrolytes -Metoprolol currently on hold, resume when okay with cardiology  ABLA, expected postoperatively Leukocytosis, suspect reactive -Continue to follow intermittent hemoglobin, 8.1 at midnight -Following for any evidence of active bleeding, check CBC now and then determine frequency going forward based on stability -Transfuse for hemoglobin 7.0 or any change in hemodynamics  Hypomagnesemia -Last labs were at midnight, check BMP, magnesium, Phos now -Replete electrolytes aggressively as above -Follow I/O  Itching, facial rash -She had a similar episode when she had a bupivacaine epidural before.  Pharmacy reviewing and likely need to  flag this as a potential allergy.  Disposition -Agree that she should be able to transition out of the ICU today 12/19   Best Practice: (right click and "Reselect all SmartList Selections" daily)   Diet/type: Regular consistency (see orders) DVT prophylaxis: SCDs, further DVT ppx per OB/primary team GI prophylaxis: N/A Lines: Arterial Line Foley:  Yes, and it is still needed Code Status:  full code Last date of multidisciplinary goals of care discussion [Per Primary Team]  Labs:  CBC: Recent Labs  Lab 04/20/23 0617 04/21/23 0040  WBC 5.9 13.5*  HGB 9.9* 8.1*  HCT 30.5* 24.7*  MCV 85.9 84.9  PLT 203 182   Basic Metabolic Panel: Recent Labs  Lab 04/21/23 0040  NA 131*  K 3.7  CL 105  CO2 17*  GLUCOSE 121*  BUN 8  CREATININE 0.56  CALCIUM 7.6*  MG 1.3*  PHOS 3.3    GFR: Estimated Creatinine Clearance: 125.1 mL/min (by C-G formula based on SCr of 0.56 mg/dL). Recent Labs  Lab 04/20/23 0617 04/21/23 0040  WBC 5.9 13.5*   Liver Function Tests: Recent Labs  Lab 04/21/23 0040  AST 20  ALT 11  ALKPHOS 102  BILITOT 0.6  PROT 4.5*  ALBUMIN 2.0*   No results for input(s): "LIPASE", "AMYLASE" in the last 168 hours. No results for input(s): "AMMONIA" in the last 168  hours.  ABG: No results found for: "PHART", "PCO2ART", "PO2ART", "HCO3", "TCO2", "ACIDBASEDEF", "O2SAT"   Coagulation Profile: Recent Labs  Lab 04/21/23 0040  INR 1.1    Cardiac Enzymes: No results for input(s): "CKTOTAL", "CKMB", "CKMBINDEX", "TROPONINI" in the last 168 hours.  HbA1C: Hgb A1c MFr Bld  Date/Time Value Ref Range Status  11/16/2022 03:42 PM 4.8 4.8 - 5.6 % Final    Comment:             Prediabetes: 5.7 - 6.4          Diabetes: >6.4          Glycemic control for adults with diabetes: <7.0   11/07/2020 11:03 AM 4.7 (L) 4.8 - 5.6 % Final    Comment:             Prediabetes: 5.7 - 6.4          Diabetes: >6.4          Glycemic control for adults with diabetes: <7.0     CBG: Recent Labs  Lab 04/21/23 0328  GLUCAP 116*     Critical care time: NA     Levy Pupa, MD, PhD 04/21/2023, 9:11 AM [facility] [phone] or if no answer before 7:00PM call [phone] For any issues after 7:00PM please call eLink [phone]

## 2023-04-21 NOTE — Lactation Note (Signed)
This note was copied from a baby's chart. Lactation Consultation Note  Patient Name: Sara Lopez IONGE'X Date: 04/21/2023 Reason for consult: Initial assessment;Term  P2, 39 wks, LC rounds to medical unit to see mom. Encouraged mom we would support her in her goals. Per mom she pumped 2 months with first baby. Offered breast pump could be set up for mom if she desired- to encourage her milk to come in. Mom receptive to baby getting colostrum, having LC set up pump.    Feeding Mother's Current Feeding Choice: Breast Milk and Formula Nipple Type: Slow - flow  Interventions Interventions: Education   Consult Status Consult Status: Follow-up Date: 04/22/23 Follow-up type: In-patient    [facility] 04/21/2023, 2:17 PM

## 2023-04-21 NOTE — Progress Notes (Signed)
OB Note Patient not on her home metop xl 12.5 qday during her current hospital admission. HR 90s and not having PVCs. D/w Dr. Servando Salina and okay to continue to stay off.   Cornelia Copa MD Attending Center for [employer] (Faculty Practice) 04/21/2023 Time: [phone]

## 2023-04-21 NOTE — Transfer of Care (Signed)
Immediate Anesthesia Transfer of Care Note  Patient: Sara Lopez  Procedure(s) Performed: CESAREAN SECTION WITH BILATERAL TUBAL LIGATION  Patient Location: ICU  Anesthesia Type:Epidural  Level of Consciousness: awake, alert , and oriented  Airway & Oxygen Therapy: Patient Spontanous Breathing  Post-op Assessment: Report given to RN and Post -op Vital signs reviewed and stable  Post vital signs: Reviewed and stable  Last Vitals:  Vitals Value Taken Time  BP 112/61 04/21/23 0233  Temp 37.1 C 04/21/23 0331  Pulse 94 04/21/23 0339  Resp 14 04/21/23 0339  SpO2 95 % 04/21/23 0339  Vitals shown include unfiled device data.  Last Pain:  Vitals:   04/21/23 0331  TempSrc: Oral  PainSc:          Complications: No notable events documented.

## 2023-04-21 NOTE — Progress Notes (Signed)
eLink Physician-Brief Progress Note Patient Name: Daleisa Strege DOB: 06-Mar-1996 MRN: [id number]   Date of Service  04/21/2023  HPI/Events of Note  Patient with a history of HOCM and past cardiac arrest s/p ICD admitted to the ICU for close post-operative monitoring s/p C-Section.  eICU Interventions  New Patient Evaluation.        Thomasene Lot Tabius Rood 04/21/2023, 12:44 AM

## 2023-04-21 NOTE — Progress Notes (Signed)
Rounding Note    Patient Name: Sara Lopez Date of Encounter: 04/21/2023  [facility] Cardiologist: Thomasene Ripple, DO    Subjective   Chandler is doing well .  Had C section around midnight  She and baby are doing well  Has not had much to eat while she has been here. Likey the explanation for her mildly reduce BP and higher than normal HR   Will start a regular diet   She is stable from a cardiac standpoint and can be transferred to the [facility] ward today    Inpatient Medications    Scheduled Meds:  Chlorhexidine Gluconate Cloth  6 each Topical Q0600   scopolamine  1 patch Transdermal Once   Continuous Infusions:  lactated ringers     naloxone HCl (NARCAN) 2 mg in dextrose 5 % 250 mL infusion     promethazine (PHENERGAN) injection (IM or IVPB)     PRN Meds: diphenhydrAMINE **OR** diphenhydrAMINE, docusate sodium, ketorolac **OR** ketorolac, naloxone **AND** sodium chloride flush, naloxone HCl (NARCAN) 2 mg in dextrose 5 % 250 mL infusion, ondansetron (ZOFRAN) IV, oxyCODONE, polyethylene glycol, promethazine (PHENERGAN) injection (IM or IVPB)   Vital Signs    Vitals:   04/21/23 0720 04/21/23 0740 04/21/23 0807 04/21/23 0830  BP:  103/69    Pulse: 97 85  81  Resp: 12 16  13   Temp:   98.1 F (36.7 C)   TempSrc:   Oral   SpO2: 94% 95%  94%  Weight:      Height:        Intake/Output Summary (Last 24 hours) at 04/21/2023 0902 Last data filed at 04/21/2023 0800 Gross per 24 hour  Intake 6325.7 ml  Output 2330 ml  Net 3995.7 ml      04/21/2023   12:21 AM 04/20/2023    6:35 AM 04/19/2023    3:49 PM  Last 3 Weights  Weight (lbs) 228 lb 9.9 oz 230 lb 1.6 oz 227 lb 8 oz  Weight (kg) 103.7 kg 104.373 kg 103.193 kg      Telemetry    NSR  - Personally Reviewed  ECG     - Personally Reviewed  Physical Exam   GEN: No acute distress.   Neck: No JVD Cardiac: RRR, soft murmur  Respiratory: Clear to auscultation bilaterally. GI: Soft,  nontender, non-distended  MS: No edema; No deformity. Neuro:  Nonfocal  Psych: Normal affect   Labs    High Sensitivity Troponin:  No results for input(s): "TROPONINIHS" in the last 720 hours.   Chemistry Recent Labs  Lab 04/21/23 0040  NA 131*  K 3.7  CL 105  CO2 17*  GLUCOSE 121*  BUN 8  CREATININE 0.56  CALCIUM 7.6*  MG 1.3*  PROT 4.5*  ALBUMIN 2.0*  AST 20  ALT 11  ALKPHOS 102  BILITOT 0.6  GFRNONAA >60  ANIONGAP 9    Lipids No results for input(s): "CHOL", "TRIG", "HDL", "LABVLDL", "LDLCALC", "CHOLHDL" in the last 168 hours.  Hematology Recent Labs  Lab 04/20/23 0617 04/21/23 0040  WBC 5.9 13.5*  RBC 3.55* 2.91*  HGB 9.9* 8.1*  HCT 30.5* 24.7*  MCV 85.9 84.9  MCH 27.9 27.8  MCHC 32.5 32.8  RDW 13.0 12.9  PLT 203 182   Thyroid No results for input(s): "TSH", "FREET4" in the last 168 hours.  BNPNo results for input(s): "BNP", "PROBNP" in the last 168 hours.  DDimer No results for input(s): "DDIMER" in the last 168 hours.  Radiology    No results found.  Cardiac Studies     Patient Profile     27 y.o. female   Assessment & Plan      HOCM:   s/p C section .   She did very well.   Hemodynamics are good .   BP is borderline low.   I suspect this is from being NPO all day yesterday . She is tolerating coffee today , no nausea  Will write for a regular diet .  OK to transfer to [facility] ward   Continue current meds.         For questions or updates, please contact [facility] Please consult [email or web address] for contact info under        Signed, Kristeen Miss, MD  04/21/2023, 9:02 AM

## 2023-04-21 NOTE — Progress Notes (Signed)
Subjective:feels well Postpartum Day 1: Cesarean Delivery Patient reports incisional pain and tolerating PO.    Objective: Vital signs in last 24 hours: Temp:  [98.1 F (36.7 C)-99.3 F (37.4 C)] 98.1 F (36.7 C) (12/19 0807) Pulse Rate:  [71-103] 81 (12/19 0830) Resp:  [10-27] 13 (12/19 0830) BP: (87-140)/(31-99) 103/69 (12/19 0740) SpO2:  [89 %-100 %] 94 % (12/19 0830) Arterial Line BP: (81-139)/(40-96) 101/70 (12/19 0720) Weight:  [103.7 kg] 103.7 kg (12/19 0021)  Physical Exam:  General: alert, cooperative, and no distress Lochia: appropriate Uterine Fundus: firm Incision: healing well, no significant drainage DVT Evaluation: No evidence of DVT seen on physical exam.  Recent Labs    04/20/23 0617 04/21/23 0040  HGB 9.9* 8.1*  HCT 30.5* 24.7*    Assessment/Plan: Status post Cesarean section. Doing well postoperatively. May transfer to [facility] per critical care   .  Scheryl Darter, MD 04/21/2023, 10:42 AM

## 2023-04-21 NOTE — Progress Notes (Signed)
Pt face spotted red areas noted. Pt states that her whole body wash itching but it is getting better. She states that this happened with the last pregnancy. MD and Pharmacy notified

## 2023-04-21 NOTE — Consult Note (Addendum)
NAME:  Sara Lopez, MRN:  [id number], DOB:  [birth year], LOS: 1 ADMISSION DATE:  04/20/2023 CONSULTATION DATE: 04/20/2023 REFERRING MD:  Vergie Living - [facility] CHIEF COMPLAINT:  High-risk postpartum   History of Present Illness:  27 year old woman who presented to [facility] ICU 12/18 as a transfer from [facility] postpartum (C-section). High-risk pregnancy in the setting of HOCM, previous postpartum hemorrhage. PMHx significant for HOCM (s/p ICD placement) c/b VT arrest, mild developmental delay, ADHD, depression.  Patient initially presented to [facility] 12/18 for IOL (GA [redacted]wk0d, G2P1001). Initial plan was for attempted vaginal delivery, however patient was unable to tolerate Pitocin (hypotension) and had fetal intolerance of labor prompting C-section. Patient was taken to OR 12/18PM for C-section and bilateral tubal ligation. Intraoperative course was uncomplicated with 1L EBL and patient was transferred to [facility] ICU postoperatively for close monitoring.  PCCM consulted for ICU admission/management.  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   Coronary-myocardial bridge    LHC at [facility] 2/14: LAD mid myocardial bridge (patent in diastole and near complete occlusion distally) // Myoview 2/14: Inferior attenuation, no ischemia, normal EF   Depression    Developmental delay 07/21/2011   History of cardiac arrest 09/21/2016   History of echocardiogram 08/2016   Echo 5/18: severe asymmetric septal hypertrophy, EF 60-65, dynamic obstruction at rest, peak LVOT 328 cm/sec and peak gradient 43 mmHg, no RWMA, septal thickness 24.6 mm, post wall thickness 15.5 mm, +SAM, mild MR, severe LAE, PASP 30   History of echocardiogram 1.2014 at [facility]   Echo 1/14: Hypertrophic CM, mild dynamic LVOT - peak 27 mmHg, chordal SAM with LVOT obstruction, mild MR, dilate LCA, abnormal LV diastolic function,  hyperdynamic LV systolic function    History of MI (myocardial infarction) 09/16/2016   HOCM (hypertrophic obstructive cardiomyopathy) (HCC)    ICD (implantable cardioverter-defibrillator) in place    Medtronic model D334DRM (Protect), serial number [id number], implanted 04/30/2010    Myocardial infarction (HCC)    hx of elevated Tn levels // LHC in 2014 at [facility] with myocardial bridging   Red Chart Rounds Patient 12/19/2020   Red Chart Rounds Patient Care Plan     [place]  G1P0  Current GA: [redacted]w[redacted]d          OB History  Gravida  Para  Term  Preterm  AB  Living  1  0  0  0  0  0  SAB  IAB  Ectopic  Multiple  Live Births  0  0  0  0  0        Patient encounter date not specified.        Red Chart Rounds Diagnosis: HOCM s/p remote cardiac arrest with ICD in place, coronary-myocardial bridge     Consultants: Cards,    Single implantable cardioverter-defibrillator (ICD) in situ 07/21/2011   VF (ventricular fibrillation) (HCC)    admitted in 2011 with VF arrest >> dx with HOCM // s/p ICD   Significant Hospital Events: Including procedures, antibiotic start and stop dates in addition to other pertinent events   12/18 - Presented to [facility] for IOL, tentative plan for vaginal delivery but slow progression, maternal intolerance of Pitocin (hypotension) and fetal intolerance of labor; subsequently taken to OR for C-section/BTL. Transferred to 63M ICU postoperatively.  Interim History / Subjective:  PCCM consulted for ICU management postoperatively. Patient appears well, no complaints  of pain/nausea, feels well L&D RN at bedside completing fundal checks/massage  Objective:  Blood pressure 107/69, pulse 99, temperature 98.7 F (37.1 C), temperature source Oral, resp. rate 12, height 5\' 5"  (1.651 m), weight 103.7 kg, SpO2 98%, currently breastfeeding.        Intake/Output Summary (Last 24 hours) at 04/21/2023 0027 Last data filed at 04/20/2023 2336 Gross per 24 hour  Intake 2300 ml   Output 1030 ml  Net 1270 ml   Filed Weights   04/20/23 0635 04/21/23 0021  Weight: 104.4 kg 103.7 kg   Physical Examination: General: Overall well-appearing young woman in NAD. Pleasant and conversant. HEENT: [place]/AT, anicteric sclera, PERRL, moist mucous membranes. Neuro: Awake, oriented x 4. Responds to verbal stimuli. Following commands consistently. Moves all 4 extremities spontaneously. Mild generalized weakness s/p OR/anesthesia, no focal neurologic deficits.  CV: RRR, no m/g/r. ICD in place L chest. PULM: Breathing even and unlabored on RA. Lung fields CTAB. GI: Soft, nontender, mildly distended s/p OR. Pfannenstiel incision present without surrounding erythema/drainage, honeycomb dressing in place clean/dry/intact. Hypoactive bowel sounds. Extremities: Trace bilateral symmetric LE edema noted. Skin: Warm/dry, no rashes.  Resolved Hospital Problem List:    Assessment & Plan:  Postpartum, high risk in the setting of HOCM S/p C-section, bilateral tubal ligation History of postpartum hemorrhage - Admit to 76M ICU for close monitoring - OB team primary - Postoperative management per OB - Fundal checks per protocol - Neosynephrine titrated to goal MAP > 65, currently titrated off/tolerating well - Epidural to be removed by L&D team - Oxycodone, antiemetics PRN  HOCM ICD in situ History of VT cardiac arrest S/p ICD placement. Echo 05/2021 with EF 65-70%, no significant outflow tract gradient, moderate LVH, G1DD, mildly enlarged, small pericardial effusion. Followed by Dr. Servando Salina (Cardiology).  - Cardiology following - Goal euvolemia, avoid volume depletion - Previously on metoprolol, defer resumption to Cards - Cardiac monitoring - Optimize electrolytes for K > 4, Mg > 2  ABLA, expected postoperatively Leukocytosis, suspect reactive - Trend H&H, WBC - Monitor for signs of active bleeding - Transfuse for Hgb < 7.0 or hemodynamically significant bleeding  Hypomagnesemia -  Trend BMP, Mg - Replete electrolytes as indicated - Monitor I&Os  Best Practice: (right click and "Reselect all SmartList Selections" daily)   Diet/type: clear liquids, ADAT DVT prophylaxis: SCDs, further DVT ppx per OB/primary team GI prophylaxis: N/A Lines: Arterial Line Foley:  Yes, and it is still needed Code Status:  full code Last date of multidisciplinary goals of care discussion [Per Primary Team]  Labs:  CBC: Recent Labs  Lab 04/20/23 0617  WBC 5.9  HGB 9.9*  HCT 30.5*  MCV 85.9  PLT 203   Basic Metabolic Panel: No results for input(s): "NA", "K", "CL", "CO2", "GLUCOSE", "BUN", "CREATININE", "CALCIUM", "MG", "PHOS" in the last 168 hours.  GFR: CrCl cannot be calculated (Patient's most recent lab result is older than the maximum 21 days allowed.). Recent Labs  Lab 04/20/23 0617  WBC 5.9   Liver Function Tests: No results for input(s): "AST", "ALT", "ALKPHOS", "BILITOT", "PROT", "ALBUMIN" in the last 168 hours. No results for input(s): "LIPASE", "AMYLASE" in the last 168 hours. No results for input(s): "AMMONIA" in the last 168 hours.  ABG: No results found for: "PHART", "PCO2ART", "PO2ART", "HCO3", "TCO2", "ACIDBASEDEF", "O2SAT"   Coagulation Profile: No results for input(s): "INR", "PROTIME" in the last 168 hours.  Cardiac Enzymes: No results for input(s): "CKTOTAL", "CKMB", "CKMBINDEX", "TROPONINI" in the last 168 hours.  HbA1C: Hgb A1c MFr Bld  Date/Time Value Ref Range Status  11/16/2022 03:42 PM 4.8 4.8 - 5.6 % Final    Comment:             Prediabetes: 5.7 - 6.4          Diabetes: >6.4          Glycemic control for adults with diabetes: <7.0   11/07/2020 11:03 AM 4.7 (L) 4.8 - 5.6 % Final    Comment:             Prediabetes: 5.7 - 6.4          Diabetes: >6.4          Glycemic control for adults with diabetes: <7.0    CBG: No results for input(s): "GLUCAP" in the last 168 hours.  Review of Systems:   Review of Systems  Constitutional:   Negative for chills, fever and malaise/fatigue.  HENT:  Negative for congestion and sore throat.   Eyes:  Negative for blurred vision.  Respiratory:  Negative for cough and shortness of breath.   Cardiovascular:  Negative for chest pain and leg swelling.  Gastrointestinal:  Positive for nausea. Negative for abdominal pain, diarrhea and vomiting.  Genitourinary:  Negative for dysuria.  Skin:  Negative for itching.  Neurological:  Negative for dizziness, weakness and headaches.   Past Medical History:  She,  has a past medical history of ADHD (attention deficit hyperactivity disorder) (07/21/2011), Cardiac pacemaker in situ (03/27/2018), Coronary-myocardial bridge, Depression, Developmental delay (07/21/2011), History of cardiac arrest (09/21/2016), History of echocardiogram (08/2016), History of echocardiogram (1.2014 at [facility]), History of MI (myocardial infarction) (09/16/2016), HOCM (hypertrophic obstructive cardiomyopathy) (HCC), ICD (implantable cardioverter-defibrillator) in place, Myocardial infarction [facility]), Red Chart Rounds Patient (12/19/2020), Single implantable cardioverter-defibrillator (ICD) in situ (07/21/2011), and VF (ventricular fibrillation) (HCC).   Surgical History:   Past Surgical History:  Procedure Laterality Date   ICD GENERATOR CHANGEOUT N/A 05/10/2018   Procedure: ICD GENERATOR CHANGEOUT;  Surgeon: Duke Salvia, MD;  Location: [facility] INVASIVE CV LAB;  Service: Cardiovascular;  Laterality: N/A;   ICD IMPLANT  2011    Social History:   reports that she quit smoking about 2 years ago. Her smoking use included cigarettes. She has quit using smokeless tobacco. She reports that she does not currently use alcohol after a past usage of about 1.0 standard drink of alcohol per week. She reports that she does not use drugs.   Family History:  Her family history is not on file. She was adopted.   Allergies: No Known Allergies   Home Medications: Prior to Admission medications    Medication Sig Start Date End Date Taking? Authorizing Provider  cyclobenzaprine (FLEXERIL) 10 MG tablet Take 1 tablet (10 mg total) by mouth 2 (two) times daily as needed for muscle spasms. 04/19/23  Yes Federico Flake, MD  metoprolol succinate (TOPROL XL) 25 MG 24 hr tablet Take 0.5 tablets (12.5 mg total) by mouth daily. 01/07/23  Yes Tobb, Kardie, DO  pantoprazole (PROTONIX) 20 MG tablet Take 1 tablet (20 mg total) by mouth daily. 02/03/23  Yes Adam Phenix, MD  Prenatal Vit-Fe Fumarate-FA (MULTIVITAMIN-PRENATAL) 27-0.8 MG TABS tablet Take 1 tablet by mouth daily at 12 noon.   Yes [provider]    Critical care time:   The patient is critically ill with multiple organ system failure and requires high complexity decision making for assessment and support, frequent evaluation and titration of therapies, advanced monitoring, review  of radiographic studies and interpretation of complex data.   Critical Care Time devoted to patient care services, exclusive of separately billable procedures, described in this note is 37 minutes.  Tim Lair, PA-C [facility] 04/21/23 12:27 AM  Please see [email or web address] for pager details.  From 7A-7P if no response, please call [phone] After hours, please call ELink [phone]

## 2023-04-21 NOTE — Progress Notes (Signed)
PHARMACY - ANTICOAGULATION CONSULT NOTE  Pharmacy Consult for Lovenox Indication: VTE prophylaxis  Allergies  Allergen Reactions   Bupivacaine Itching    From epidural - all over body itching both times (2023, 2024)    Patient Measurements: Height: 5\' 5"  (165.1 cm) Weight: 103.7 kg (228 lb 9.9 oz) IBW/kg (Calculated) : 57   Vital Signs: Temp: 97.6 F (36.4 C) (12/19 1739) Temp Source: Oral (12/19 1512) BP: 116/67 (12/19 1739) Pulse Rate: 85 (12/19 1739)  Labs: Recent Labs    04/20/23 0617 04/21/23 0040 04/21/23 1516  HGB 9.9* 8.1* 8.0*  HCT 30.5* 24.7* 24.1*  PLT 203 182 215  LABPROT  --  14.7  --   INR  --  1.1  --   CREATININE  --  0.56 0.61    Estimated Creatinine Clearance: 125.1 mL/min (by C-G formula based on SCr of 0.61 mg/dL).   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   Coronary-myocardial bridge    LHC at [facility] 2/14: LAD mid myocardial bridge (patent in diastole and near complete occlusion distally) // Myoview 2/14: Inferior attenuation, no ischemia, normal EF   Depression    Developmental delay 07/21/2011   History of cardiac arrest 09/21/2016   History of echocardiogram 08/2016   Echo 5/18: severe asymmetric septal hypertrophy, EF 60-65, dynamic obstruction at rest, peak LVOT 328 cm/sec and peak gradient 43 mmHg, no RWMA, septal thickness 24.6 mm, post wall thickness 15.5 mm, +SAM, mild MR, severe LAE, PASP 30   History of echocardiogram 1.2014 at [facility]   Echo 1/14: Hypertrophic CM, mild dynamic LVOT - peak 27 mmHg, chordal SAM with LVOT obstruction, mild MR, dilate LCA, abnormal LV diastolic function, hyperdynamic LV systolic function    History of MI (myocardial infarction) 09/16/2016   HOCM (hypertrophic obstructive cardiomyopathy) (HCC)    ICD (implantable cardioverter-defibrillator) in place    Medtronic model D334DRM (Protect),  serial number [id number], implanted 04/30/2010    Myocardial infarction (HCC)    hx of elevated Tn levels // LHC in 2014 at [facility] with myocardial bridging   Red Chart Rounds Patient 12/19/2020   Red Chart Rounds Patient Care Plan     [place]  G1P0  Current GA: [redacted]w[redacted]d          OB History  Gravida  Para  Term  Preterm  AB  Living  1  0  0  0  0  0  SAB  IAB  Ectopic  Multiple  Live Births  0  0  0  0  0        Patient encounter date not specified.        Red Chart Rounds Diagnosis: HOCM s/p remote cardiac arrest with ICD in place, coronary-myocardial bridge     Consultants: Cards,    Single implantable cardioverter-defibrillator (ICD) in situ 07/21/2011   VF (ventricular fibrillation) (HCC)    admitted in 2011 with VF arrest >> dx with HOCM // s/p ICD    Medications:  Continue home meds  Assessment: 27yo F s/p LTCS at 39 weeks with complex cardiac history. Request for VTE prophylaxis Goal of Therapy:   Monitor platelets by anticoagulation protocol: Yes   Plan:  Lovenox 0.5mg /kg (50mg ) sq q24h starting tonight at 2200  Claybon Jabs 04/21/2023,5:54 PM

## 2023-04-21 NOTE — Lactation Note (Signed)
This note was copied from a baby's chart. Lactation Consultation Note  Patient Name: Sara Lopez [id number] Date: 04/21/2023 Reason for consult: Follow-up assessment   LC set up electric pump- completed partial cycle with mom on left breast stimulation good for future milk supply but may only see drops.- Encouraged mom that the best collection of colostrum in first days would be with hand expression or hand pump. Mom uses hand pump on right breast- easily collects several ml's of colostrum. LC services and milk storage handouts shared with mom. Encouraged mom that pumped colostrum good on bedside table for 4 hours, should be moved to another room soon- can feed directly to baby. Encouraged EBM rubbed into nipple for nipple health as well.   Maternal Data Has patient been taught Hand Expression?: Yes Does the patient have breastfeeding experience prior to this delivery?: No (Pumped for 2 months)  Feeding Mother's Current Feeding Choice: Formula Nipple Type: Slow - flow  Lactation Tools Discussed/Used Tools: Pump Breast pump type: Manual;Double-Electric Breast Pump Pump Education: Milk Storage Reason for Pumping: Breast stimulation- colostrum collection Pumping frequency: Every 3 if breast stimulation desired  Interventions Interventions: Hand pump;DEBP;Education;LC Services brochure (Milk storage guidelines)  Discharge Pump: Manual  Consult Status Consult Status: Follow-up Date: 04/22/23 Follow-up type: In-patient    [facility] 04/21/2023, 5:48 PM

## 2023-04-21 NOTE — Anesthesia Postprocedure Evaluation (Signed)
Anesthesia Post Note  Patient: Sara Lopez  Procedure(s) Performed: CESAREAN SECTION WITH BILATERAL TUBAL LIGATION     Patient location during evaluation: ICU Anesthesia Type: Epidural Level of consciousness: awake and alert Pain management: pain level controlled Vital Signs Assessment: post-procedure vital signs reviewed and stable Respiratory status: spontaneous breathing, respiratory function stable and nonlabored ventilation Cardiovascular status: blood pressure returned to baseline Postop Assessment: epidural receding and no apparent nausea or vomiting Anesthetic complications: no   No notable events documented.  Last Vitals:  Vitals:   04/21/23 0450 04/21/23 0500  BP:    Pulse: 97 88  Resp: 15 20  Temp:    SpO2: 93% 96%    Last Pain:  Vitals:   04/21/23 0400  TempSrc:   PainSc: 0-No pain   Pain Goal:                   Beryle Lathe

## 2023-04-22 DIAGNOSIS — Z8679 Personal history of other diseases of the circulatory system: Secondary | ICD-10-CM | POA: Diagnosis not present

## 2023-04-22 DIAGNOSIS — D649 Anemia, unspecified: Secondary | ICD-10-CM | POA: Diagnosis not present

## 2023-04-22 DIAGNOSIS — I421 Obstructive hypertrophic cardiomyopathy: Secondary | ICD-10-CM | POA: Diagnosis not present

## 2023-04-22 LAB — CBC
HCT: 20.8 % — ABNORMAL LOW (ref 36.0–46.0)
HCT: 26.1 % — ABNORMAL LOW (ref 36.0–46.0)
Hemoglobin: 6.9 g/dL — CL (ref 12.0–15.0)
Hemoglobin: 8.8 g/dL — ABNORMAL LOW (ref 12.0–15.0)
MCH: 28.5 pg (ref 26.0–34.0)
MCH: 28.7 pg (ref 26.0–34.0)
MCHC: 33.2 g/dL (ref 30.0–36.0)
MCHC: 33.7 g/dL (ref 30.0–36.0)
MCV: 86 fL (ref 80.0–100.0)
MCV: 85 fL (ref 80.0–100.0)
Platelets: 164 10*3/uL (ref 150–400)
Platelets: 183 10*3/uL (ref 150–400)
RBC: 2.42 MIL/uL — ABNORMAL LOW (ref 3.87–5.11)
RBC: 3.07 MIL/uL — ABNORMAL LOW (ref 3.87–5.11)
RDW: 13.2 % (ref 11.5–15.5)
RDW: 14.3 % (ref 11.5–15.5)
WBC: 8.6 10*3/uL (ref 4.0–10.5)
WBC: 8.5 10*3/uL (ref 4.0–10.5)
nRBC: 0 % (ref 0.0–0.2)
nRBC: 0 % (ref 0.0–0.2)

## 2023-04-22 LAB — BASIC METABOLIC PANEL
Anion gap: 5 (ref 5–15)
BUN: 8 mg/dL (ref 6–20)
CO2: 23 mmol/L (ref 22–32)
Calcium: 8.2 mg/dL — ABNORMAL LOW (ref 8.9–10.3)
Chloride: 108 mmol/L (ref 98–111)
Creatinine, Ser: 0.91 mg/dL (ref 0.44–1.00)
GFR, Estimated: >60 mL/min (ref >60)
Glucose, Bld: 93 mg/dL (ref 70–99)
Potassium: 4 mmol/L (ref 3.5–5.1)
Sodium: 136 mmol/L (ref 135–145)

## 2023-04-22 LAB — MAGNESIUM: Magnesium: 1.9 mg/dL (ref 1.7–2.4)

## 2023-04-22 LAB — PREPARE RBC (CROSSMATCH)

## 2023-04-22 LAB — SURGICAL PATHOLOGY

## 2023-04-22 LAB — PHOSPHORUS: Phosphorus: 3.5 mg/dL (ref 2.5–4.6)

## 2023-04-22 MED ORDER — WITCH HAZEL-GLYCERIN EX PADS: 1.0000 | MEDICATED_PAD | CUTANEOUS | Status: DC | PRN

## 2023-04-22 MED ORDER — DIBUCAINE (PERIANAL) 1 % EX OINT: 1.0000 | TOPICAL_OINTMENT | CUTANEOUS | Status: DC | PRN

## 2023-04-22 MED ORDER — MAGNESIUM SULFATE 2 GM/50ML IV SOLN
2.0000 g | Freq: Once | INTRAVENOUS | Status: AC
  Administered 2023-04-22: 2 g via INTRAVENOUS
  Filled 2023-04-22: qty 50

## 2023-04-22 MED ORDER — SODIUM CHLORIDE 0.9% IV SOLUTION: Freq: Once | INTRAVENOUS | Status: AC

## 2023-04-22 NOTE — Plan of Care (Signed)
  Problem: Education: Goal: Knowledge of General Education information will improve Description: Including pain rating scale, medication(s)/side effects and non-pharmacologic comfort measures Outcome: Progressing   Problem: Health Behavior/Discharge Planning: Goal: Ability to manage health-related needs will improve Outcome: Progressing   Problem: Clinical Measurements: Goal: Ability to maintain clinical measurements within normal limits will improve Outcome: Progressing Goal: Will remain free from infection Outcome: Progressing Goal: Diagnostic test results will improve Outcome: Progressing Goal: Respiratory complications will improve Outcome: Progressing Goal: Cardiovascular complication will be avoided Outcome: Progressing   Problem: Activity: Goal: Risk for activity intolerance will decrease Outcome: Progressing   Problem: Nutrition: Goal: Adequate nutrition will be maintained Outcome: Progressing   Problem: Coping: Goal: Level of anxiety will decrease Outcome: Progressing   Problem: Elimination: Goal: Will not experience complications related to bowel motility Outcome: Progressing Goal: Will not experience complications related to urinary retention Outcome: Progressing   Problem: Pain Management: Goal: General experience of comfort will improve Outcome: Progressing   Problem: Safety: Goal: Ability to remain free from injury will improve Outcome: Progressing   Problem: Skin Integrity: Goal: Risk for impaired skin integrity will decrease Outcome: Progressing   Problem: Education: Goal: Knowledge of the prescribed therapeutic regimen will improve Outcome: Progressing Goal: Understanding of sexual limitations or changes related to disease process or condition will improve Outcome: Progressing Goal: Individualized Educational Video(s) Outcome: Progressing   Problem: Self-Concept: Goal: Communication of feelings regarding changes in body function or  appearance will improve Outcome: Progressing   Problem: Skin Integrity: Goal: Demonstration of wound healing without infection will improve Outcome: Progressing   Problem: Education: Goal: Knowledge of condition will improve Outcome: Progressing Goal: Individualized Educational Video(s) Outcome: Progressing Goal: Individualized Newborn Educational Video(s) Outcome: Progressing   Problem: Activity: Goal: Will verbalize the importance of balancing activity with adequate rest periods Outcome: Progressing Goal: Ability to tolerate increased activity will improve Outcome: Progressing   Problem: Coping: Goal: Ability to identify and utilize available resources and services will improve Outcome: Progressing   Problem: Life Cycle: Goal: Chance of risk for complications during the postpartum period will decrease Outcome: Progressing   Problem: Role Relationship: Goal: Ability to demonstrate positive interaction with newborn will improve Outcome: Progressing   Problem: Skin Integrity: Goal: Demonstration of wound healing without infection will improve Outcome: Progressing

## 2023-04-22 NOTE — Lactation Note (Signed)
This note was copied from a baby's chart. Lactation Consultation Note  Patient Name: Sara Lopez [id number] Date: 04/22/2023 Age:28 hours    LC presented to room for follow up. RN in room. Lactation to follow up at a later time.     Feeding Nipple Type: Slow - flow        T'Errah N Jasen Hartstein 04/22/2023, 2:30 PM

## 2023-04-22 NOTE — Progress Notes (Signed)
Rounding Note    Patient Name: Sara Lopez Date of Encounter: 04/22/2023  [facility] Cardiologist: Thomasene Ripple, DO    Subjective   Sara Lopez is doing well .  Had C section around midnight  She and baby are doing well  Has not had much to eat while she has been here. Likey the explanation for her mildly reduce BP and higher than normal HR   Feeling a bit weak  Is anemic Scheduled to get 2 units PRBC   No dyspnea , no tachycardia    Inpatient Medications    Scheduled Meds:  Chlorhexidine Gluconate Cloth  6 each Topical Q0600   enoxaparin (LOVENOX) injection  50 mg Subcutaneous Q24H   gabapentin  200 mg Oral BID   ibuprofen  600 mg Oral Q6H   polyethylene glycol  17 g Oral Daily   prenatal multivitamin  1 tablet Oral Q1200   scopolamine  1 patch Transdermal Once   simethicone  80 mg Oral TID PC   Continuous Infusions:  naloxone HCl (NARCAN) 2 mg in dextrose 5 % 250 mL infusion     promethazine (PHENERGAN) injection (IM or IVPB)     PRN Meds: coconut oil, witch hazel-glycerin **AND** dibucaine, diphenhydrAMINE **OR** diphenhydrAMINE, diphenhydrAMINE, menthol-cetylpyridinium, naloxone **AND** sodium chloride flush, naloxone HCl (NARCAN) 2 mg in dextrose 5 % 250 mL infusion, ondansetron (ZOFRAN) IV, oxyCODONE, promethazine (PHENERGAN) injection (IM or IVPB), senna-docusate, simethicone   Vital Signs    Vitals:   04/22/23 0528 04/22/23 0530 04/22/23 0700 04/22/23 1015  BP:  103/65 94/74 (!) 84/36  Pulse:  81 72 65  Resp:  16 18 17   Temp:  (!) 97.5 F (36.4 C) 97.8 F (36.6 C) 97.6 F (36.4 C)  TempSrc:  Oral Oral Oral  SpO2:  97% 98% 98%  Weight: 103.8 kg     Height:        Intake/Output Summary (Last 24 hours) at 04/22/2023 1046 Last data filed at 04/22/2023 0908 Gross per 24 hour  Intake 580 ml  Output 2770 ml  Net -2190 ml      04/22/2023    5:28 AM 04/21/2023   12:21 AM 04/20/2023    6:35 AM  Last 3 Weights  Weight (lbs) 228 lb  12.8 oz 228 lb 9.9 oz 230 lb 1.6 oz  Weight (kg) 103.783 kg 103.7 kg 104.373 kg      Telemetry    NSR  - Personally Reviewed  ECG     - Personally Reviewed  Physical Exam   Physical Exam: Blood pressure (!) 84/36, pulse 65, temperature 97.6 F (36.4 C), temperature source Oral, resp. rate 17, height 5\' 5"  (1.651 m), weight 103.8 kg, SpO2 98%, unknown if currently breastfeeding.       GEN:  Well nourished, well developed in no acute distress HEENT: Normal NECK: No JVD; No carotid bruits LYMPHATICS: No lymphadenopathy CARDIAC: RRR , soft systolic murmur  RESPIRATORY:  Clear to auscultation without rales, wheezing or rhonchi  ABDOMEN: Soft, non-tender, non-distended MUSCULOSKELETAL:  No edema; No deformity  SKIN: Warm and dry NEUROLOGIC:  Alert and oriented x 3   Labs    High Sensitivity Troponin:  No results for input(s): "TROPONINIHS" in the last 720 hours.   Chemistry Recent Labs  Lab 04/21/23 0040 04/21/23 1516 04/22/23 0520  NA 131* 134* 136  K 3.7 4.5 4.0  CL 105 105 108  CO2 17* 21* 23  GLUCOSE 121* 100* 93  BUN 8 8 8  CREATININE 0.56 0.61 0.91  CALCIUM 7.6* 8.4* 8.2*  MG 1.3* 1.9 1.9  PROT 4.5*  --   --   ALBUMIN 2.0*  --   --   AST 20  --   --   ALT 11  --   --   ALKPHOS 102  --   --   BILITOT 0.6  --   --   GFRNONAA >60 >60 >60  ANIONGAP 9 8 5     Lipids No results for input(s): "CHOL", "TRIG", "HDL", "LABVLDL", "LDLCALC", "CHOLHDL" in the last 168 hours.  Hematology Recent Labs  Lab 04/21/23 0040 04/21/23 1516 04/22/23 0520  WBC 13.5* 13.0* 8.6  RBC 2.91* 2.79* 2.42*  HGB 8.1* 8.0* 6.9*  HCT 24.7* 24.1* 20.8*  MCV 84.9 86.4 86.0  MCH 27.8 28.7 28.5  MCHC 32.8 33.2 33.2  RDW 12.9 13.2 13.2  PLT 182 215 164   Thyroid No results for input(s): "TSH", "FREET4" in the last 168 hours.  BNPNo results for input(s): "BNP", "PROBNP" in the last 168 hours.  DDimer No results for input(s): "DDIMER" in the last 168 hours.   Radiology    No  results found.  Cardiac Studies     Patient Profile     27 y.o. female   Assessment & Plan      HOCM:   s/p C section .    Overall Sara Lopez is doing well Getting 2 u PRBC for anemia and hypotention  No dyspnea ,  HR is good   She is doing well post c section   Will see her tomorrow to determine if we should restart her metoprolol succinate 12.5 mg a day upon discharge .  She will follow up with Dr. Servando Salina as OP           For questions or updates, please contact [facility] Please consult [email or web address] for contact info under        Signed, Kristeen Miss, MD  04/22/2023, 10:46 AM

## 2023-04-22 NOTE — Progress Notes (Signed)
Daily Postpartum Note  Admission Date: 04/20/2023 Current Date: 04/22/2023 7:44 AM  Sara Lopez is a 27 y.o. [id number] POD#2 s/p primary low transverse c/s and bilateral salpingectomies at [redacted]w[redacted]d for non reassuring fetal heart tones; patient admitted for IOL due to cardiac issues.  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 01/05/2023   Rubella non-immune status, antepartum 11/18/2022   Unwanted fertility 11/16/2022   Supervision of high risk pregnancy, antepartum 11/09/2022   Red Chart Rounds Patient 12/19/2020   Cardiac pacemaker in situ 03/27/2018   Heart disease 03/27/2018   HOCM (hypertrophic obstructive cardiomyopathy) (HCC) 09/21/2016   Coronary-myocardial bridge    VF (ventricular fibrillation) (HCC) 07/21/2011    Overnight/24hr events:  Transferred out of ICU to the regular OB unit early evening yesterday  Subjective:  No cardiac or pulmonary s/s and meeting all OB postpartum/PP goals, including passing flatus  Objective:    Current Vital Signs 24h Vital Sign Ranges  T 97.8 F (36.6 C) Temp  Avg: 97.9 F (36.6 C)  Min: 97.5 F (36.4 C)  Max: 98.3 F (36.8 C)  BP 94/74 BP  Min: 94/74  Max: 116/67  HR 72 Pulse  Avg: 88.2  Min: 72  Max: 112  RR 18 Resp  Avg: 17  Min: 13  Max: 19  SaO2 98 % Room Air SpO2  Avg: 97.4 %  Min: 94 %  Max: 99 %       24 Hour I/O Current Shift I/O  Time Ins Outs 12/19 0701 - 12/20 0700 In: 830 [P.O.:730] Out: 2070 [Urine:2070] No intake/output data recorded.   Patient Vitals for the past 24 hrs:  BP Temp Temp src Pulse Resp SpO2 Weight  04/22/23 0700 94/74 97.8 F (36.6 C) Oral 72 18 98 % --  04/22/23 0530 103/65 (!) 97.5 F (36.4 C) Oral 81 16 97 % --  04/22/23 0528 -- -- -- -- -- -- 103.8 kg  04/21/23 2315 102/64 98.3 F (36.8 C) Oral 88 18 98 % --  04/21/23 1938 (!) 98/54 (!) 97.5  F (36.4 C) Oral 84 18 98 % --  04/21/23 1804 (!) 101/55 -- -- 93 -- -- --  04/21/23 1800 -- -- -- -- -- 98 % --  04/21/23 1759 108/61 -- -- 98 -- -- --  04/21/23 1757 108/61 -- -- (!) 112 -- -- --  04/21/23 1739 116/67 97.6 F (36.4 C) -- 85 19 99 % --  04/21/23 1512 -- 98.1 F (36.7 C) Oral -- -- -- --  04/21/23 1143 -- 98.3 F (36.8 C) Oral -- -- -- --  04/21/23 0830 -- -- -- 81 13 94 % --  04/21/23 0807 -- 98.1 F (36.7 C) Oral -- -- -- --   Physical exam: General: Well nourished, well developed female in no acute distress. Abdomen: moderate distension, +BS, appropriately and minimally ttp, c/d/I dressing Cardiovascular: S1, S2 normal, no murmur, rub or gallop, regular rate and rhythm Respiratory: CTAB Extremities: no clubbing, cyanosis or edema Skin: Warm and dry.   Medications: Current Facility-Administered Medications  Medication Dose Route Frequency Provider Last Rate Last Admin   0.9 %  sodium chloride infusion (Manually program via Guardrails IV Fluids)   Intravenous Once [place] Bing, MD       Chlorhexidine Gluconate Cloth 2 % PADS 6 each  6 each Topical Q0600 [place] Bing, MD   6  each at 04/21/23 0048   coconut oil  1 Application Topical PRN [place] Bing, MD       witch hazel-glycerin (TUCKS) pad 1 Application  1 Application Topical PRN [place] Bing, MD       And   dibucaine (NUPERCAINAL) 1 % rectal ointment 1 Application  1 Application Rectal PRN [place] Bing, MD       diphenhydrAMINE (BENADRYL) injection 12.5 mg  12.5 mg Intravenous Q4H PRN Beryle Lathe, MD       Or   diphenhydrAMINE (BENADRYL) capsule 25 mg  25 mg Oral Q4H PRN Beryle Lathe, MD   25 mg at 04/21/23 1531   diphenhydrAMINE (BENADRYL) capsule 25 mg  25 mg Oral Q6H PRN [place] Bing, MD       enoxaparin (LOVENOX) injection 50 mg  50 mg Subcutaneous Q24H [place] Bing, MD   50 mg at 04/21/23 2229   gabapentin (NEURONTIN) capsule 200 mg  200 mg Oral BID [place] Bing, MD    200 mg at 04/21/23 2228   ibuprofen (ADVIL) tablet 600 mg  600 mg Oral Q6H [place] Bing, MD   600 mg at 04/22/23 0557   magnesium sulfate IVPB 2 g 50 mL  2 g Intravenous Once [place] Bing, MD       menthol-cetylpyridinium (CEPACOL) lozenge 3 mg  1 lozenge Oral Q2H PRN [place] Bing, MD       naloxone [facility]) injection 0.4 mg  0.4 mg Intravenous PRN Beryle Lathe, MD       And   sodium chloride flush (NS) 0.9 % injection 3 mL  3 mL Intravenous PRN Beryle Lathe, MD       naloxone HCl [facility]) 2 mg in dextrose 5 % 250 mL infusion  1-2 mcg/kg/hr Intravenous Continuous PRN Beryle Lathe, MD       ondansetron [facility]) injection 4 mg  4 mg Intravenous Q8H PRN Beryle Lathe, MD       oxyCODONE (Oxy IR/ROXICODONE) immediate release tablet 5-10 mg  5-10 mg Oral Q4H PRN [place] Bing, MD   5 mg at 04/21/23 1843   polyethylene glycol (MIRALAX / GLYCOLAX) packet 17 g  17 g Oral Daily [place] Bing, MD       prenatal multivitamin tablet 1 tablet  1 tablet Oral Q1200 [place] Bing, MD       promethazine (PHENERGAN) 6.25 mg/NS 50 mL IVPB  6.25 mg Intravenous Q6H PRN [place] Bing, MD       scopolamine (TRANSDERM-SCOP) 1 MG/3DAYS 1.5 mg  1 patch Transdermal Once Beryle Lathe, MD       senna-docusate (Senokot-S) tablet 2 tablet  2 tablet Oral QHS PRN [place] Bing, MD       simethicone (MYLICON) chewable tablet 80 mg  80 mg Oral TID PC [place] Bing, MD       simethicone (MYLICON) chewable tablet 80 mg  80 mg Oral PRN [place] Bing, MD        Labs:  Recent Labs  Lab 04/21/23 0040 04/21/23 1516 04/22/23 0520  WBC 13.5* 13.0* 8.6  HGB 8.1* 8.0* 6.9*  HCT 24.7* 24.1* 20.8*  PLT 182 215 164   Recent Labs  Lab 04/21/23 0040 04/21/23 1516 04/22/23 0520  NA 131* 134* 136  K 3.7 4.5 4.0  CL 105 105 108  CO2 17* 21* 23  BUN 8 8 8   CREATININE 0.56 0.61 0.91  CALCIUM 7.6* 8.4* 8.2*  PROT 4.5*  --   --  BILITOT 0.6  --   --   ALKPHOS 102  --   --   ALT  11  --   --   AST 20  --   --   GLUCOSE 121* 100* 93   Mg 1.9  Radiology:  No new imaging  Assessment & Plan:  Patient stable *PP/postop: routine care. B POS/rpr neg/boy, d/w pt and she desires circ *Anemia: d/w Dr. Servando Salina and optimal to have Hgb above 8. D/w patient and she is fine with 2U PRBCs; she states she needed blood after her last delivery. 2U PRBCs ordered *CV: Cardiology following and appreciate recs. Restart home metoprolol xl 12.5 qday if becomes tachy, PVCs *PPx: lovenox, oob ad lib, SCDs *FEN/GI: regular diet. SLIV. Mg 2gm IV ordered *Dispo: likely tomorrow.   Cornelia Copa MD Attending [facility] GYN Consult Phone: [phone] (M-F, [phone]) & [phone]  (Off hours, weekends, holidays)

## 2023-04-23 ENCOUNTER — Other Ambulatory Visit (HOSPITAL_COMMUNITY): Payer: Self-pay

## 2023-04-23 DIAGNOSIS — D649 Anemia, unspecified: Secondary | ICD-10-CM

## 2023-04-23 DIAGNOSIS — I422 Other hypertrophic cardiomyopathy: Secondary | ICD-10-CM

## 2023-04-23 DIAGNOSIS — Z8679 Personal history of other diseases of the circulatory system: Secondary | ICD-10-CM | POA: Diagnosis not present

## 2023-04-23 LAB — BASIC METABOLIC PANEL
Anion gap: 4 — ABNORMAL LOW (ref 5–15)
BUN: 9 mg/dL (ref 6–20)
CO2: 24 mmol/L (ref 22–32)
Calcium: 7.4 mg/dL — ABNORMAL LOW (ref 8.9–10.3)
Chloride: 110 mmol/L (ref 98–111)
Creatinine, Ser: 0.69 mg/dL (ref 0.44–1.00)
GFR, Estimated: >60 mL/min (ref >60)
Glucose, Bld: 92 mg/dL (ref 70–99)
Potassium: 3.9 mmol/L (ref 3.5–5.1)
Sodium: 138 mmol/L (ref 135–145)

## 2023-04-23 LAB — CBC
HCT: 24.9 % — ABNORMAL LOW (ref 36.0–46.0)
Hemoglobin: 8.3 g/dL — ABNORMAL LOW (ref 12.0–15.0)
MCH: 28.3 pg (ref 26.0–34.0)
MCHC: 33.3 g/dL (ref 30.0–36.0)
MCV: 85 fL (ref 80.0–100.0)
Platelets: 158 10*3/uL (ref 150–400)
RBC: 2.93 MIL/uL — ABNORMAL LOW (ref 3.87–5.11)
RDW: 14.5 % (ref 11.5–15.5)
WBC: 7.3 10*3/uL (ref 4.0–10.5)
nRBC: 0.3 % — ABNORMAL HIGH (ref 0.0–0.2)

## 2023-04-23 LAB — TYPE AND SCREEN
ABO/RH(D): B POS
Antibody Screen: NEGATIVE
Unit division: 0
Unit division: 0

## 2023-04-23 LAB — BPAM RBC
Blood Product Expiration Date: 202412262359
Blood Product Expiration Date: 202412302359
ISSUE DATE / TIME: 202412201020
ISSUE DATE / TIME: 202412201343
Unit Type and Rh: 7300
Unit Type and Rh: 7300

## 2023-04-23 MED ORDER — MEASLES, MUMPS & RUBELLA VAC IJ SOLR
0.5000 mL | Freq: Once | INTRAMUSCULAR | Status: AC
  Administered 2023-04-23: 0.5 mL via SUBCUTANEOUS
  Filled 2023-04-23: qty 0.5

## 2023-04-23 MED ORDER — OXYCODONE HCL 5 MG PO TABS
5.0000 mg | ORAL_TABLET | ORAL | 0 refills | Status: AC | PRN
  Filled 2023-04-23: qty 20, 2d supply, fill #0

## 2023-04-23 MED ORDER — IBUPROFEN 600 MG PO TABS
600.0000 mg | ORAL_TABLET | Freq: Four times a day (QID) | ORAL | 0 refills | Status: AC
  Filled 2023-04-23: qty 30, 8d supply, fill #0

## 2023-04-23 NOTE — Progress Notes (Signed)
Rounding Note    Patient Name: Sara Lopez Date of Encounter: 04/23/2023  [facility] Cardiologist: Lavona Mound Tobb, DO    Subjective   BP 103/65 this morning, pulse 63.  Hemoglobin 8.3 today, up from 6.9 yesterday.  She denies any chest pain, dyspnea, lightheadedness  Inpatient Medications    Scheduled Meds:  Chlorhexidine Gluconate Cloth  6 each Topical Q0600   enoxaparin (LOVENOX) injection  50 mg Subcutaneous Q24H   gabapentin  200 mg Oral BID   ibuprofen  600 mg Oral Q6H   polyethylene glycol  17 g Oral Daily   prenatal multivitamin  1 tablet Oral Q1200   scopolamine  1 patch Transdermal Once   simethicone  80 mg Oral TID PC   Continuous Infusions:  naloxone HCl (NARCAN) 2 mg in dextrose 5 % 250 mL infusion     promethazine (PHENERGAN) injection (IM or IVPB)     PRN Meds: coconut oil, witch hazel-glycerin **AND** dibucaine, diphenhydrAMINE **OR** diphenhydrAMINE, diphenhydrAMINE, menthol-cetylpyridinium, naloxone **AND** sodium chloride flush, naloxone HCl (NARCAN) 2 mg in dextrose 5 % 250 mL infusion, ondansetron (ZOFRAN) IV, oxyCODONE, promethazine (PHENERGAN) injection (IM or IVPB), senna-docusate, simethicone   Vital Signs    Vitals:   04/23/23 0500 04/23/23 0543 04/23/23 0810 04/23/23 1144  BP:  100/60 99/67 103/65  Pulse:  62 (!) 58 63  Resp:  16 17 18   Temp:  97.8 F (36.6 C) 98.2 F (36.8 C) 98.7 F (37.1 C)  TempSrc:  Oral Oral Oral  SpO2:  99% 96% 99%  Weight: 105.3 kg     Height:        Intake/Output Summary (Last 24 hours) at 04/23/2023 1257 Last data filed at 04/22/2023 1715 Gross per 24 hour  Intake 696 ml  Output --  Net 696 ml      04/23/2023    5:00 AM 04/22/2023    5:28 AM 04/21/2023   12:21 AM  Last 3 Weights  Weight (lbs) 232 lb 4 oz 228 lb 12.8 oz 228 lb 9.9 oz  Weight (kg) 105.348 kg 103.783 kg 103.7 kg      Telemetry    NSR  - Personally Reviewed  ECG     - Personally Reviewed  Physical Exam    Physical Exam: Blood pressure 103/65, pulse 63, temperature 98.7 F (37.1 C), temperature source Oral, resp. rate 18, height 5\' 5"  (1.651 m), weight 105.3 kg, SpO2 99%, unknown if currently breastfeeding.       GEN:  Well nourished, well developed in no acute distress HEENT: Normal NECK: No JVD; No carotid bruits LYMPHATICS: No lymphadenopathy CARDIAC: RRR , soft systolic murmur  RESPIRATORY:  Clear to auscultation without rales, wheezing or rhonchi  ABDOMEN: Soft, non-tender, non-distended MUSCULOSKELETAL:  No edema; No deformity  SKIN: Warm and dry NEUROLOGIC:  Alert and oriented x 3   Labs    High Sensitivity Troponin:  No results for input(s): "TROPONINIHS" in the last 720 hours.   Chemistry Recent Labs  Lab 04/21/23 0040 04/21/23 1516 04/22/23 0520 04/23/23 0410  NA 131* 134* 136 138  K 3.7 4.5 4.0 3.9  CL 105 105 108 110  CO2 17* 21* 23 24  GLUCOSE 121* 100* 93 92  BUN 8 8 8 9   CREATININE 0.56 0.61 0.91 0.69  CALCIUM 7.6* 8.4* 8.2* 7.4*  MG 1.3* 1.9 1.9  --   PROT 4.5*  --   --   --   ALBUMIN 2.0*  --   --   --  AST 20  --   --   --   ALT 11  --   --   --   ALKPHOS 102  --   --   --   BILITOT 0.6  --   --   --   GFRNONAA >60 >60 >60 >60  ANIONGAP 9 8 5  4*    Lipids No results for input(s): "CHOL", "TRIG", "HDL", "LABVLDL", "LDLCALC", "CHOLHDL" in the last 168 hours.  Hematology Recent Labs  Lab 04/22/23 0520 04/22/23 2109 04/23/23 0410  WBC 8.6 8.5 7.3  RBC 2.42* 3.07* 2.93*  HGB 6.9* 8.8* 8.3*  HCT 20.8* 26.1* 24.9*  MCV 86.0 85.0 85.0  MCH 28.5 28.7 28.3  MCHC 33.2 33.7 33.3  RDW 13.2 14.3 14.5  PLT 164 183 158   Thyroid No results for input(s): "TSH", "FREET4" in the last 168 hours.  BNPNo results for input(s): "BNP", "PROBNP" in the last 168 hours.  DDimer No results for input(s): "DDIMER" in the last 168 hours.   Radiology    No results found.  Cardiac Studies     Patient Profile     27 y.o. female with HCM  Assessment &  Plan    HCM:   History of cardiac arrest status post ICD.  Found to have HCM.  She is s/p C section, doing well.  She has been on metoprolol for PVCs during her pregnancy, spoke with Dr Servando Salina and can discontinue now that she has delivered.  Has close f/u with Dr. Servando Salina, scheduled for 1/8  Anemia: Hemoglobin as low at 6.9, status post transfusion, now 8.3  [facility] will sign off.   Medication Recommendations: Continue to hold metoprolol on discharge Other recommendations (labs, testing, etc): None Follow up as an outpatient: Has follow-up with Dr. Servando Salina on 1/8   For questions or updates, please contact [facility] Please consult [email or web address] for contact info under        Signed, Little Ishikawa, MD  04/23/2023, 12:57 PM

## 2023-04-23 NOTE — Discharge Summary (Signed)
Physician Discharge Summary  Patient ID: Sara Lopez MRN: [id number] DOB/AGE: October 18, 1995 27 y.o.  Admit date: 04/20/2023 Discharge date: 04/23/2023  Admission Diagnoses: [redacted]w[redacted]d HOCM IOL 39 weeks  Discharge Diagnoses:  Principal Problem:   History of ventricular fibrillation Active Problems:   Hypertrophic cardiomyopathy (HCC)   Postoperative anemia Fetal intolerance of labor  Discharged Condition: good  Hospital Course:  Pt was admitted for IOL 39 weeks, planning passive 2nd stage However baby developed FITL and proceeded with primary Caesarean section + BTL Surgery and post op course unremarkable Cardiology followed and recommendations followed Toprol held at discharge per Cards  Consults: OB cardiology  Significant Diagnostic Studies:   Treatments: IOL + C section  Discharge Exam: Blood pressure 103/65, pulse 63, temperature 98.7 F (37.1 C), temperature source Oral, resp. rate 18, height 5\' 5"  (1.651 m), weight 105.3 kg, SpO2 99%, unknown if currently breastfeeding. General appearance: alert, cooperative, and no distress GI: soft, non-tender; bowel sounds normal; no masses,  no organomegaly Incision/Wound:dressing is dry clean intact  Disposition: Discharge disposition: 01-Home or Self Care       Discharge Instructions     Activity as tolerated   Complete by: As directed    Diet - low sodium heart healthy   Complete by: As directed    Driving restriction    Complete by: As directed    Avoid driving for at least 1 weeks.   Leave dressing on - Keep it clean, dry, and intact until clinic visit   Complete by: As directed    Lifting restrictions   Complete by: As directed    Weight restriction of 20 lbs.   Sexual activity   Complete by: As directed    No sex 6 weeks      Allergies as of 04/23/2023       Reactions   Bupivacaine Itching   From epidural - all over body itching both times (2023, 2024)        Medication List     STOP taking  these medications    metoprolol succinate 25 MG 24 hr tablet Commonly known as: Toprol XL   pantoprazole 20 MG tablet Commonly known as: Protonix       TAKE these medications    cyclobenzaprine 10 MG tablet Commonly known as: FLEXERIL Take 1 tablet (10 mg total) by mouth 2 (two) times daily as needed for muscle spasms.   ibuprofen 600 MG tablet Commonly known as: ADVIL Take 1 tablet (600 mg total) by mouth every 6 (six) hours.   multivitamin-prenatal 27-0.8 MG Tabs tablet Take 1 tablet by mouth daily at 12 noon.   oxyCODONE 5 MG immediate release tablet Commonly known as: Oxy IR/ROXICODONE Take 1-2 tablets (5-10 mg total) by mouth every 4 (four) hours as needed for moderate pain (pain score 4-6) or severe pain (pain score 7-10) (5mg  for pain 4-6/10, 10mg  for pain 7-10/10).               Discharge Care Instructions  (From admission, onward)           Start     Ordered   04/23/23 0000  Leave dressing on - Keep it clean, dry, and intact until clinic visit        04/23/23 1355            Follow-up Information     [facility] Follow up on 04/29/2023.   Specialty: Obstetrics and Gynecology Why: post op  visit Contact information: [address] [place] [address] [phone]                Signed: Lazaro Arms 04/23/2023, 1:58 PM

## 2023-04-28 ENCOUNTER — Telehealth (HOSPITAL_COMMUNITY): Payer: Self-pay | Admitting: *Deleted

## 2023-04-28 NOTE — Telephone Encounter (Signed)
04/28/2023  Name: Sara Lopez MRN: [id number] DOB: 05-Nov-1995  Reason for Call:  Transition of Care Hospital Discharge Call  Contact Status: Patient Contact Status: Message  Language assistant needed:          Follow-Up Questions:    Inocente Salles Postnatal Depression Scale:  In the Past 7 Days:    PHQ2-9 Depression Scale:     Discharge Follow-up:    Post-discharge interventions: NA  Salena Saner, RN 04/28/2023 11:57

## 2023-05-03 ENCOUNTER — Ambulatory Visit: Payer: Medicaid Other

## 2023-05-03 ENCOUNTER — Other Ambulatory Visit: Payer: Self-pay

## 2023-05-03 VITALS — BP 118/86 | HR 70 | Ht 65.0 in | Wt 202.0 lb

## 2023-05-03 DIAGNOSIS — Z4889 Encounter for other specified surgical aftercare: Secondary | ICD-10-CM

## 2023-05-03 NOTE — Progress Notes (Signed)
 Incision Check Visit  Sara Lopez is here for incision check following primary c-section on 04/20/23.   Assessment: Honey dressing and steri-strips still intact. This RN removed dressing. Incision looks clean, dry, intact and well approximated. No s/s of infection noted.  Education: Reviewed good daily wound care and s/s of infection with patient.  Patient will follow up post partum visit . Patient to schedule PP appointment at checkout.  Rosaline Pendleton, RN 05/03/2023  4:03 PM

## 2023-05-11 ENCOUNTER — Encounter: Payer: Self-pay | Admitting: Cardiology

## 2023-05-11 ENCOUNTER — Ambulatory Visit: Payer: Medicaid Other | Attending: Cardiology | Admitting: Cardiology

## 2023-05-11 VITALS — Ht 65.0 in | Wt 200.0 lb

## 2023-05-11 DIAGNOSIS — Q245 Malformation of coronary vessels: Secondary | ICD-10-CM

## 2023-05-11 DIAGNOSIS — Z95810 Presence of automatic (implantable) cardiac defibrillator: Secondary | ICD-10-CM

## 2023-05-11 DIAGNOSIS — Z950 Presence of cardiac pacemaker: Secondary | ICD-10-CM | POA: Diagnosis not present

## 2023-05-11 DIAGNOSIS — I422 Other hypertrophic cardiomyopathy: Secondary | ICD-10-CM

## 2023-05-11 NOTE — Progress Notes (Signed)
 Cardio-Obstetrics Clinic  Follow Up Note   Date:  05/11/2023   ID:  Sara Lopez, DOB [birth year], MRN [id number]  PCP:  Kristy Sharolyn Alm DEVONNA   [facility] Providers Cardiologist:  Dub Huntsman, DO  Electrophysiologist:  Elspeth Sage, MD        Referring MD: Kristy Sharolyn Alm, PA-C   Chief Complaint:  I am doing well   She is at home. I am in the office.   Virtual Visit via Video  Note . I connected with the patient today by a   video enabled telemedicine application and verified that I am speaking with the correct person using two identifiers.   History of Present Illness:    Sara Lopez is a 28 y.o. female [G2P2002] who returns for follow postpartum visit. She delivered successful 12/182024 via c-section.   Her medical history includes of hypertrophic cardiomyopathy diagnosed as a teenager is now status post Medtronic ICD due to ventricular arrhythmia for secondary prevention with initial ICD placement in 2011 and later change in 2020 she has rerelease follow-up with Dr. 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   We took care of the patient in 2022 and she was able to deliver safely vaginally on May 06, 2021 without any complications. We again cared for the patient in her recent   Prior CV Studies Reviewed: The following studies were reviewed today: Echo   Past Medical History:  Diagnosis Date   ADHD (attention deficit hyperactivity disorder) 07/21/2011   Cardiac pacemaker in situ 03/27/2018   Coronary-myocardial bridge    LHC at [facility] 2/14: LAD mid myocardial bridge (patent in diastole and near complete occlusion distally) // Myoview 2/14: Inferior attenuation, no ischemia, normal EF   Depression    Developmental delay 07/21/2011   History of cardiac arrest 09/21/2016   History of echocardiogram 08/2016   Echo 5/18: severe asymmetric septal  hypertrophy, EF 60-65, dynamic obstruction at rest, peak LVOT 328 cm/sec and peak gradient 43 mmHg, no RWMA, septal thickness 24.6 mm, post wall thickness 15.5 mm, +SAM, mild MR, severe LAE, PASP 30   History of echocardiogram 1.2014 at [facility]   Echo 1/14: Hypertrophic CM, mild dynamic LVOT - peak 27 mmHg, chordal SAM with LVOT obstruction, mild MR, dilate LCA, abnormal LV diastolic function, hyperdynamic LV systolic function    History of MI (myocardial infarction) 09/16/2016   HOCM (hypertrophic obstructive cardiomyopathy) (HCC)    ICD (implantable cardioverter-defibrillator) in place    Medtronic model D334DRM (Protect), serial number [id number], implanted 04/30/2010    Myocardial infarction (HCC)    hx of elevated Tn levels // LHC in 2014 at [facility] with myocardial bridging   Red Chart Rounds Patient 12/19/2020   Red Chart Rounds Patient Care Plan     Lucie PARAS Ploch  G1P0  Current GA: [redacted]w[redacted]d          OB History  Gravida  Para  Term  Preterm  AB  Living  1  0  0  0  0  0  SAB  IAB  Ectopic  Multiple  Live Births  0  0  0  0  0        Patient encounter date not specified.        Red Chart Rounds Diagnosis: HOCM s/p remote cardiac arrest with ICD in place, coronary-myocardial bridge     Consultants: Cards,    Single implantable  cardioverter-defibrillator (ICD) in situ 07/21/2011   VF (ventricular fibrillation) (HCC)    admitted in 2011 with VF arrest >> dx with HOCM // s/p ICD    Past Surgical History:  Procedure Laterality Date   CESAREAN SECTION WITH BILATERAL TUBAL LIGATION N/A 04/20/2023   Procedure: CESAREAN SECTION WITH BILATERAL TUBAL LIGATION;  Surgeon: Izell Harari, MD;  Location: [facility] LD ORS;  Service: Obstetrics;  Laterality: N/A;   ICD GENERATOR CHANGEOUT N/A 05/10/2018   Procedure: ICD GENERATOR CHANGEOUT;  Surgeon: Fernande Elspeth BROCKS, MD;  Location: [facility] INVASIVE CV LAB;  Service: Cardiovascular;  Laterality: N/A;   ICD IMPLANT  2011      OB History     Gravida  2   Para  2    Term  2   Preterm      AB      Living  2      SAB      IAB      Ectopic      Multiple  0   Live Births  2               Current Medications: No outpatient medications have been marked as taking for the 05/11/23 encounter (Video Visit) with Beula Joyner, DO.     Allergies:   Bupivacaine    Social History   Socioeconomic History   Marital status: Single    Spouse name: Not on file   Number of children: Not on file   Years of education: Not on file   Highest education level: Not on file  Occupational History   Occupation: conservation officer, nature  Tobacco Use   Smoking status: Former    Current packs/day: 0.00    Types: Cigarettes    Quit date: 07/01/2020    Years since quitting: 2.8   Smokeless tobacco: Former  Building Services Engineer status: Former   Substances: Nicotine   Devices: not since preg 06/2020  Substance and Sexual Activity   Alcohol use: Not Currently    Alcohol/week: 1.0 standard drink of alcohol    Types: 1 Glasses of wine per week    Comment: not since preg   Drug use: No    Types: Marijuana    Comment: no drugs since 2011   Sexual activity: Not Currently    Birth control/protection: Pill  Other Topics Concern   Not on file  Social History Narrative   Stay at home mom   Social Drivers of Health   Financial Resource Strain: Not on file  Food Insecurity: No Food Insecurity (04/20/2023)   Hunger Vital Sign    Worried About Running Out of Food in the Last Year: Never true    Ran Out of Food in the Last Year: Never true  Transportation Needs: No Transportation Needs (04/20/2023)   PRAPARE - Administrator, Civil Service (Medical): No    Lack of Transportation (Non-Medical): No  Physical Activity: Not on file  Stress: Not on file  Social Connections: Not on file      Family History  Adopted: Yes      ROS:   Please see the history of present illness.     All other systems reviewed and are negative.   Labs/EKG Reviewed:    EKG:    EKG was ordered today.    Recent Labs: 04/21/2023: ALT 11 04/22/2023: Magnesium  1.9 04/23/2023: BUN 9; Creatinine, Ser 0.69; Hemoglobin 8.3; Platelets 158; Potassium 3.9; Sodium 138   Recent Lipid Panel No  results found for: CHOL, TRIG, HDL, CHOLHDL, LDLCALC, LDLDIRECT  Physical Exam:    VS:  Ht 5' 5 (1.651 m)   Wt 200 lb (90.7 kg)   BMI 33.28 kg/m     Wt Readings from Last 3 Encounters:  05/11/23 200 lb (90.7 kg)  05/03/23 202 lb (91.6 kg)  04/23/23 232 lb 4 oz (105.3 kg)        Risk Assessment/Risk Calculators:                 ASSESSMENT & PLAN:    Hypertrophic obstructive cardiomyopathy History of cardiac arrest ICD in situ-Medtronics  History of myocardial bridging PVCs   She is postpartum.  She is doing well.  She has recovered without any concerns.  She has been taper off the beta-blocker I do agree with this. She had a tubal ligation no anticipated pregnancies. Will discharge her from the cardio OB clinic.  She will follow-up with Dr. Fernande and team.  Patient Instructions  Medication Instructions:  Your physician recommends that you continue on your current medications as directed. Please refer to the Current Medication list given to you today.  *If you need a refill on your cardiac medications before your next appointment, please call your pharmacy*    Follow-Up: At [facility], you and your health needs are our priority.  As part of our continuing mission to provide you with exceptional heart care, we have created designated Provider Care Teams.  These Care Teams include your primary Cardiologist (physician) and Advanced Practice Providers (APPs -  Physician Assistants and Nurse Practitioners) who all work together to provide you with the care you need, when you need it.  Your next appointment:   12 months  Provider:   Armando Lauman, DO     Dispo:  No follow-ups on file.   Medication Adjustments/Labs and Tests  Ordered: Current medicines are reviewed at length with the patient today.  Concerns regarding medicines are outlined above.  Tests Ordered: No orders of the defined types were placed in this encounter.  Medication Changes: No orders of the defined types were placed in this encounter.

## 2023-05-11 NOTE — Patient Instructions (Addendum)
 Medication Instructions:  Your physician recommends that you continue on your current medications as directed. Please refer to the Current Medication list given to you today.  *If you need a refill on your cardiac medications before your next appointment, please call your pharmacy*   Follow-Up: At [facility], you and your health needs are our priority.  As part of our continuing mission to provide you with exceptional heart care, we have created designated Provider Care Teams.  These Care Teams include your primary Cardiologist (physician) and Advanced Practice Providers (APPs -  Physician Assistants and Nurse Practitioners) who all work together to provide you with the care you need, when you need it.   Your next appointment:   12 month(s)  Provider:   Thomasene Ripple, DO

## 2023-05-20 ENCOUNTER — Ambulatory Visit: Payer: Medicaid Other | Admitting: Cardiology

## 2023-05-23 ENCOUNTER — Ambulatory Visit (INDEPENDENT_AMBULATORY_CARE_PROVIDER_SITE_OTHER): Payer: Medicaid Other

## 2023-05-23 DIAGNOSIS — I422 Other hypertrophic cardiomyopathy: Secondary | ICD-10-CM

## 2023-05-23 LAB — CUP PACEART REMOTE DEVICE CHECK
Battery Remaining Longevity: 88 mo
Battery Voltage: 3.01 V
Brady Statistic RV Percent Paced: 2.9 %
Date Time Interrogation Session: 20250120012502
HighPow Impedance: 62 Ohm
Implantable Lead Connection Status: 753985
Implantable Lead Implant Date: 20111229
Implantable Lead Location: 753860
Implantable Lead Model: 7122
Implantable Pulse Generator Implant Date: 20200108
Lead Channel Impedance Value: 817 Ohm
Lead Channel Impedance Value: 912 Ohm
Lead Channel Pacing Threshold Amplitude: 2.5 V
Lead Channel Pacing Threshold Pulse Width: 0.4 ms
Lead Channel Sensing Intrinsic Amplitude: 14.875 mV
Lead Channel Sensing Intrinsic Amplitude: 14.875 mV
Lead Channel Setting Pacing Amplitude: 3.5 V
Lead Channel Setting Pacing Pulse Width: 1 ms
Lead Channel Setting Sensing Sensitivity: 0.6 mV
Zone Setting Status: 755011
Zone Setting Status: 755011

## 2023-06-06 ENCOUNTER — Ambulatory Visit: Payer: Medicaid Other | Admitting: Obstetrics and Gynecology

## 2023-06-29 ENCOUNTER — Encounter: Payer: Self-pay | Admitting: Internal Medicine

## 2023-07-01 NOTE — Progress Notes (Signed)
 Remote ICD transmission.

## 2023-08-22 ENCOUNTER — Ambulatory Visit (INDEPENDENT_AMBULATORY_CARE_PROVIDER_SITE_OTHER): Payer: Medicaid Other

## 2023-08-22 DIAGNOSIS — I422 Other hypertrophic cardiomyopathy: Secondary | ICD-10-CM | POA: Diagnosis not present

## 2023-08-23 LAB — CUP PACEART REMOTE DEVICE CHECK
Battery Remaining Longevity: 82 mo
Battery Voltage: 2.99 V
Brady Statistic RV Percent Paced: 4.49 %
Date Time Interrogation Session: 20250421061705
HighPow Impedance: 69 Ohm
Implantable Lead Connection Status: 753985
Implantable Lead Implant Date: 20111229
Implantable Lead Location: 753860
Implantable Lead Model: 7122
Implantable Pulse Generator Implant Date: 20200108
Lead Channel Impedance Value: 893 Ohm
Lead Channel Impedance Value: 988 Ohm
Lead Channel Pacing Threshold Amplitude: 2.5 V
Lead Channel Pacing Threshold Pulse Width: 0.4 ms
Lead Channel Sensing Intrinsic Amplitude: 14 mV
Lead Channel Sensing Intrinsic Amplitude: 14 mV
Lead Channel Setting Pacing Amplitude: 3.5 V
Lead Channel Setting Pacing Pulse Width: 1 ms
Lead Channel Setting Sensing Sensitivity: 0.6 mV
Zone Setting Status: 755011
Zone Setting Status: 755011

## 2023-10-12 NOTE — Progress Notes (Signed)
 Remote ICD transmission.

## 2023-10-12 NOTE — Addendum Note (Signed)
 Addended by: Edra Govern D on: 10/12/2023 01:57 PM   Modules accepted: Orders

## 2023-11-21 ENCOUNTER — Ambulatory Visit: Payer: Medicaid Other

## 2024-02-20 ENCOUNTER — Ambulatory Visit: Payer: Medicaid Other

## 2024-03-16 ENCOUNTER — Ambulatory Visit: Admitting: Cardiology

## 2024-04-05 ENCOUNTER — Encounter: Payer: Self-pay | Admitting: Cardiology

## 2024-04-05 ENCOUNTER — Ambulatory Visit: Attending: Cardiology | Admitting: Cardiology

## 2024-04-05 NOTE — Progress Notes (Deleted)
  Electrophysiology Office Note:   Date:  04/05/2024  ID:  Sara Lopez, DOB [birth year], MRN [id number]  Primary Cardiologist: Dub Huntsman, DO Electrophysiologist: Fonda Kitty, MD  {Click to update primary MD,subspecialty MD or APP then REFRESH:1}    History of Present Illness:   Sara Lopez is a 28 y.o. female with h/o HCM s/p ICD who is being seen today for follow up.  Discussed the use of AI scribe software for clinical note transcription with the patient, who gave verbal consent to proceed.  History of Present Illness     Review of systems complete and found to be negative unless listed in HPI.   EP Information / Studies Reviewed:    {EKGtoday:28818}      Echo 05/26/21:  1. There is no significant outflow tract gradient. Left ventricular  ejection fraction, by estimation, is 65 to 70%. The left ventricle has  normal function. The left ventricle has no regional wall motion  abnormalities. There is moderate left ventricular  hypertrophy. Left ventricular diastolic parameters are consistent with  Grade I diastolic dysfunction (impaired relaxation).   2. Right ventricular systolic function is normal. The right ventricular  size is mildly enlarged. There is normal pulmonary artery systolic  pressure. The estimated right ventricular systolic pressure is 23.4 mmHg.   3. Left atrial size was moderately dilated.   4. A small pericardial effusion is present. The pericardial effusion is  circumferential. There is no evidence of cardiac tamponade.   5. Systolic motion of the mitral valve. The mitral valve is normal in  structure. No evidence of mitral valve regurgitation. No evidence of  mitral stenosis.   6. The aortic valve is normal in structure. Aortic valve regurgitation is  mild. No aortic stenosis is present.   7. Pulmonic valve regurgitation is moderate.   8. The inferior vena cava is normal in size with greater than 50%  respiratory variability, suggesting right  atrial pressure of 3 mmHg.    Physical Exam:   VS:  There were no vitals taken for this visit.   Wt Readings from Last 3 Encounters:  05/11/23 200 lb (90.7 kg)  05/03/23 202 lb (91.6 kg)  04/23/23 232 lb 4 oz (105.3 kg)     GEN: Well nourished, well developed in no acute distress NECK: No JVD CARDIAC: {EPRHYTHM:28826}, no murmurs, rubs, gallops RESPIRATORY:  Clear to auscultation without rales, wheezing or rhonchi  ABDOMEN: Soft, non-distended EXTREMITIES:  No edema; No deformity   ASSESSMENT AND PLAN:    S/p ICD HCM H/o cardiac arrest Assessment & Plan       Follow up with {EPMDS:28135::EP Team} {EPFOLLOW LE:71826}  Signed, Fonda Kitty, MD

## 2024-05-21 ENCOUNTER — Ambulatory Visit: Payer: Medicaid Other

## 2024-08-20 ENCOUNTER — Ambulatory Visit: Payer: Medicaid Other
